# Patient Record
Sex: Female | Born: 1955 | Race: White | Hispanic: No | Marital: Married | Smoking: Never smoker
Health system: Southern US, Community
[De-identification: ages and names within clinical notes are randomized; demographics above are authoritative.]

## PROBLEM LIST (undated history)

## (undated) DIAGNOSIS — F32A Depression, unspecified: Secondary | ICD-10-CM

## (undated) DIAGNOSIS — F329 Major depressive disorder, single episode, unspecified: Secondary | ICD-10-CM

## (undated) DIAGNOSIS — F419 Anxiety disorder, unspecified: Secondary | ICD-10-CM

## (undated) DIAGNOSIS — E669 Obesity, unspecified: Secondary | ICD-10-CM

## (undated) DIAGNOSIS — G4700 Insomnia, unspecified: Secondary | ICD-10-CM

## (undated) DIAGNOSIS — R002 Palpitations: Secondary | ICD-10-CM

## (undated) DIAGNOSIS — K219 Gastro-esophageal reflux disease without esophagitis: Secondary | ICD-10-CM

## (undated) HISTORY — DX: Obesity, unspecified: E66.9

## (undated) HISTORY — DX: Gastro-esophageal reflux disease without esophagitis: K21.9

## (undated) HISTORY — DX: Major depressive disorder, single episode, unspecified: F32.9

## (undated) HISTORY — DX: Anxiety disorder, unspecified: F41.9

## (undated) HISTORY — DX: Insomnia, unspecified: G47.00

## (undated) HISTORY — PX: COLONOSCOPY: SHX174

## (undated) HISTORY — PX: ABDOMINAL HYSTERECTOMY: SHX81

## (undated) HISTORY — DX: Depression, unspecified: F32.A

## (undated) HISTORY — PX: NO PAST SURGERIES: SHX2092

---

## 2008-01-20 LAB — HM COLONOSCOPY

## 2010-06-23 ENCOUNTER — Emergency Department: Payer: Self-pay | Admitting: Emergency Medicine

## 2011-01-19 ENCOUNTER — Ambulatory Visit: Payer: Self-pay

## 2012-01-20 ENCOUNTER — Ambulatory Visit: Payer: Self-pay

## 2012-07-12 ENCOUNTER — Ambulatory Visit: Payer: Self-pay

## 2013-01-20 ENCOUNTER — Ambulatory Visit: Payer: Self-pay

## 2014-03-01 ENCOUNTER — Ambulatory Visit: Payer: Self-pay

## 2014-03-01 LAB — HM MAMMOGRAPHY

## 2014-11-09 ENCOUNTER — Other Ambulatory Visit: Payer: Self-pay | Admitting: Unknown Physician Specialty

## 2014-12-11 ENCOUNTER — Telehealth: Payer: Self-pay | Admitting: Unknown Physician Specialty

## 2014-12-11 NOTE — Telephone Encounter (Signed)
E-Fax came through for refill: Rx: PARoxetine (PAXIL-CR) 12.5 MG 24 hr tablet Copy of Rx in basket

## 2014-12-12 NOTE — Telephone Encounter (Signed)
This was not a refill request.

## 2015-02-12 ENCOUNTER — Encounter: Payer: 59 | Admitting: Unknown Physician Specialty

## 2015-02-12 DIAGNOSIS — K219 Gastro-esophageal reflux disease without esophagitis: Secondary | ICD-10-CM | POA: Insufficient documentation

## 2015-02-12 DIAGNOSIS — F329 Major depressive disorder, single episode, unspecified: Secondary | ICD-10-CM

## 2015-02-12 DIAGNOSIS — G4700 Insomnia, unspecified: Secondary | ICD-10-CM | POA: Insufficient documentation

## 2015-02-12 DIAGNOSIS — F32A Depression, unspecified: Secondary | ICD-10-CM | POA: Insufficient documentation

## 2015-02-19 ENCOUNTER — Ambulatory Visit (INDEPENDENT_AMBULATORY_CARE_PROVIDER_SITE_OTHER): Payer: 59 | Admitting: Unknown Physician Specialty

## 2015-02-19 ENCOUNTER — Encounter: Payer: Self-pay | Admitting: Unknown Physician Specialty

## 2015-02-19 VITALS — BP 108/73 | HR 61 | Temp 98.4°F | Ht 59.6 in | Wt 189.4 lb

## 2015-02-19 DIAGNOSIS — Z0000 Encounter for general adult medical examination without abnormal findings: Secondary | ICD-10-CM | POA: Diagnosis not present

## 2015-02-19 MED ORDER — PAROXETINE HCL ER 12.5 MG PO TB24: 12.5000 mg | ORAL_TABLET | Freq: Every day | ORAL | Status: DC

## 2015-02-19 NOTE — Progress Notes (Signed)
   BP 108/73 mmHg  Pulse 61  Temp(Src) 98.4 F (36.9 C)  Ht 4' 11.6" (1.514 m)  Wt 189 lb 6.4 oz (85.911 kg)  BMI 37.48 kg/m2  SpO2 97%  LMP  (LMP Unknown)   Subjective:    Patient ID: Maureen King, female    DOB: 08-24-1955, 59 y.o.   MRN: [id number]  HPI: Maureen King is a 59 y.o. female   Relevant past medical, surgical, family and social history reviewed and updated as indicated. Interim medical history since our last visit reviewed. Allergies and medications reviewed and updated.  Pt has lot 55 pounds through [facility] over the past year.    Review of Systems  Constitutional: Negative.   HENT: Negative.   Eyes: Negative.   Respiratory: Negative.   Cardiovascular: Negative.   Gastrointestinal: Negative.   Endocrine: Negative.   Genitourinary: Negative.   Musculoskeletal: Negative.   Skin: Negative.   Allergic/Immunologic: Negative.   Neurological: Negative.   Hematological: Negative.   Psychiatric/Behavioral: Negative.     Per HPI unless specifically indicated above     Objective:    BP 108/73 mmHg  Pulse 61  Temp(Src) 98.4 F (36.9 C)  Ht 4' 11.6" (1.514 m)  Wt 189 lb 6.4 oz (85.911 kg)  BMI 37.48 kg/m2  SpO2 97%  LMP  (LMP Unknown)  Wt Readings from Last 3 Encounters:  02/19/15 189 lb 6.4 oz (85.911 kg)  02/07/14 233 lb (105.688 kg)    Physical Exam  Constitutional: She is oriented to person, place, and time. She appears well-developed and well-nourished.  HENT:  Head: Normocephalic and atraumatic.  Eyes: Pupils are equal, round, and reactive to light. Right eye exhibits no discharge. Left eye exhibits no discharge. No scleral icterus.  Neck: Normal range of motion. Neck supple. Carotid bruit is not present. No thyromegaly present.  Cardiovascular: Normal rate, regular rhythm and normal heart sounds.  Exam reveals no gallop and no friction rub.   No murmur heard. Pulmonary/Chest: Effort normal and breath sounds normal. No  respiratory distress. She has no wheezes. She has no rales.  Abdominal: Soft. Bowel sounds are normal. There is no tenderness. There is no rebound.  Genitourinary: No breast swelling, tenderness or discharge.  Musculoskeletal: Normal range of motion.  Lymphadenopathy:    She has no cervical adenopathy.  Neurological: She is alert and oriented to person, place, and time.  Skin: Skin is warm, dry and intact. No rash noted.  Psychiatric: She has a normal mood and affect. Her speech is normal and behavior is normal. Judgment and thought content normal. Cognition and memory are normal.  Vitals reviewed.  Seeing gyn for post menopausal bleeding.  Got labs through [facility] showing excellent results.  Refusing HIV and Hepatitis C.  ++++       Assessment & Plan:   Problem List Items Addressed This Visit    None    Visit Diagnoses    Routine general medical examination at a health care facility    -  Primary    Relevant Orders    MM DIGITAL SCREENING BILATERAL        Follow up plan: Return in about 1 year (around 02/19/2016).

## 2015-02-19 NOTE — Patient Instructions (Signed)
Think you're too busy to work out? We have the workout for you. In minutes, high-intensity interval training (H.I.I.T.) will have you sweating, breathing hard and maximizing the health benefits of exercise without the time commitment. Best of all, it's scientifically proven to work.  What Is H.I.I.T.? SHORT WORKOUTS 101 High-intensity interval training - referred to as H.I.I.T. - is based on the idea that short bursts of strenuous exercise can have a big impact on the body. If moderate exercise - like a 20-minute jog - is good for your heart, lungs and metabolism, H.I.I.T. packs the benefits of that workout and more into a few minutes. It may sound too good to be true, but learning this exercise technique and adapting it to your life can mean saving hours at the gym. If you think you don't have time to exercise, H.I.I.T. may be the workout for you.  You can try it with any aerobic activity you like. The principles of H.I.I.T. can be applied to running, biking, stair climbing, swimming, jumping rope, rowing, even hopping or skipping. (Yes, skipping!)  The downside? Even though H.I.I.T. lasts only minutes, the workouts are tough, requiring you to push your body near its limit.  HOW INTENSE IS HIGH INTENSITY? High-intensity exercise is obviously not a casual stroll down the street, but it's not a run-till-your-lungs-pop explosion, either. Think breathless, not winded. Heart-pounding, not exploding. Legs pumping, but not uncontrolled.  You don't need any fancy heart rate monitors to do these workouts. Use cues from your body as a guide. In the middle of a high-intensity workout you should be able to say single words, but not complete whole sentences. So, if you can keep chatting to your workout partner during this workout, pump it up a few notches.  03-20-29 Training This simple program will help you make the most of a short workout by improving heart health and endurance. Try it with your favorite  cardiovascular activity. The essentials of 03-20-29 training are simple. Run, ride or perhaps row on a rowing machine gently for 30 seconds, accelerate to a moderate pace for 20 seconds, then sprint as hard as you can for 10 seconds. (It should be called 30-20-10 training, obviously, but that is not as catchy.) Repeat.  You don't even need a stopwatch to monitor the 30-, 20-, and 10-second time changes. You can just count to yourself, which seems to make the intervals pass more quickly.  Best of all? The grueling, all-out portion of the workout lasts for only 10 seconds. C'mon, you can do anything for 10 seconds, right?  Got 10 Minutes? A solitary minute of hard work buried in 10 minutes of activity can make a big difference.  The 10-Minute Workout If you like to run, bike, row or swim - just a little bit - this workout is a great option for you. Step 1 Warm up for 2 minutes Step 2 Pedal, run or swim all-out for 20 seconds. Repeat 2 more times Warm down for 3 minutes    GET STARTED To benefit the most from really, really short workouts, you need to build the habit of doing them into your hectic life. Ideally, you'll complete the workout three times a week. The best way to build that habit is to start small and be willing to tweak your schedule where you can to accommodate your new workout.  First set up a spot in your house for your workout, equipped with whatever you need to get the job done: sneakers, a  chair, a towel, etc. Then slot your workout in before you would normally shower. (You can even do it in the bathroom.) Or wake up five minutes earlier and do it first thing in the morning, so you can head off to work feeling accomplished. Or do it during your lunch hour. Run up your office's stairs or grab a private conference room for just a few minutes. Or work it into your commute. If you walk or bike to work, add some heavy intervals on the way home.  GET A BOOST FROM MUSIC Creating a  workout playlist of high-energy tunes you love will not make your workout feel easier, but it may cause you to exercise harder without even realizing it. Best of all, if you are doing a really short workout, you need only one or two great tunes to get you through. If you are willing to try something a bit different, make your own music as you exercise. Sing, hum, clap your hands, whatever you can do to jam along to your playlist. It may give you an extra boost to finish strong.  Find a song or podcast that's the length of your really, really short workout. By the time the song is over, you're done.  Excerpted from the [employer] Well column http://www.nytimes.com/well/guides/really-really-short-workouts?smid=fb-nytwell&smtyp=pay  

## 2015-03-08 ENCOUNTER — Ambulatory Visit
Admission: RE | Admit: 2015-03-08 | Discharge: 2015-03-08 | Disposition: A | Discharge status: Home / Self Care | Payer: 59 | Source: Ambulatory Visit | Attending: Unknown Physician Specialty | Admitting: Unknown Physician Specialty

## 2015-03-08 DIAGNOSIS — Z1231 Encounter for screening mammogram for malignant neoplasm of breast: Secondary | ICD-10-CM | POA: Diagnosis present

## 2015-03-08 DIAGNOSIS — Z0000 Encounter for general adult medical examination without abnormal findings: Secondary | ICD-10-CM | POA: Diagnosis present

## 2015-11-28 ENCOUNTER — Encounter: Payer: Self-pay | Admitting: Family Medicine

## 2015-11-28 ENCOUNTER — Ambulatory Visit (INDEPENDENT_AMBULATORY_CARE_PROVIDER_SITE_OTHER): Payer: 59 | Admitting: Family Medicine

## 2015-11-28 VITALS — BP 105/71 | HR 56 | Temp 97.8°F | Ht 60.0 in | Wt 184.0 lb

## 2015-11-28 DIAGNOSIS — N390 Urinary tract infection, site not specified: Secondary | ICD-10-CM | POA: Diagnosis not present

## 2015-11-28 DIAGNOSIS — R350 Frequency of micturition: Secondary | ICD-10-CM | POA: Diagnosis not present

## 2015-11-28 MED ORDER — SULFAMETHOXAZOLE-TRIMETHOPRIM 800-160 MG PO TABS: 1.0000 | ORAL_TABLET | Freq: Two times a day (BID) | ORAL | Status: DC

## 2015-11-28 NOTE — Patient Instructions (Signed)

## 2015-11-28 NOTE — Progress Notes (Signed)
BP 105/71 mmHg  Pulse 56  Temp(Src) 97.8 F (36.6 C)  Ht 5' (1.524 m)  Wt 184 lb (83.462 kg)  BMI 35.94 kg/m2  SpO2 98%  LMP  (LMP Unknown)   Subjective:    Patient ID: Maureen King, female    DOB: 08-28-1955, 60 y.o.   MRN: [id number]  HPI: Maureen King is a 60 y.o. female  Chief Complaint  Patient presents with  . Urinary Tract Infection    On Monday she used some essential oils in her water and wonders if that may have triggered it. Having alot of frequency, some burning. Some pink coloration.   Urinary symptoms began Monday. Recently started using Slim and Sassy essential oils in her water. Has been having frequency, urgency, mild dysuria, and some pink tinted urine that was yesterday. Patient states she has never had a UTI before. Denies fever, chills, sweats, or lbp. Has not tried taking anything over the counter for symptoms.   Relevant past medical, surgical, family and social history reviewed and updated as indicated. Interim medical history since our last visit reviewed. Allergies and medications reviewed and updated.  Review of Systems  Constitutional: Negative for fever, chills and diaphoresis.  Respiratory: Negative.   Cardiovascular: Negative.   Gastrointestinal: Negative for nausea, vomiting, abdominal pain, diarrhea, constipation and abdominal distention.  Genitourinary: Positive for dysuria, urgency, frequency and hematuria. Negative for flank pain.  Musculoskeletal: Negative.   Skin: Negative.   Neurological: Negative.   Psychiatric/Behavioral: Negative.     Per HPI unless specifically indicated above     Objective:    BP 105/71 mmHg  Pulse 56  Temp(Src) 97.8 F (36.6 C)  Ht 5' (1.524 m)  Wt 184 lb (83.462 kg)  BMI 35.94 kg/m2  SpO2 98%  LMP  (LMP Unknown)  Wt Readings from Last 3 Encounters:  11/28/15 184 lb (83.462 kg)  02/19/15 189 lb 6.4 oz (85.911 kg)  02/07/14 233 lb (105.688 kg)    Physical Exam  Constitutional: She  is oriented to person, place, and time. She appears well-developed and well-nourished. No distress.  HENT:  Head: Atraumatic.  Eyes: Conjunctivae are normal. No scleral icterus.  Neck: Normal range of motion. Neck supple.  Cardiovascular: Normal rate, regular rhythm and normal heart sounds.   Pulmonary/Chest: Effort normal and breath sounds normal.  Abdominal: Soft. Bowel sounds are normal. She exhibits no distension. There is no tenderness. There is no rebound and no guarding.  Musculoskeletal: Normal range of motion.  Neurological: She is alert and oriented to person, place, and time.  Skin: Skin is warm and dry. No rash noted.  Psychiatric: She has a normal mood and affect. Her behavior is normal.    Results for orders placed or performed in visit on 02/12/15  HM MAMMOGRAPHY  Result Value Ref Range   HM Mammogram from [facility]   HM COLONOSCOPY  Result Value Ref Range   HM Colonoscopy from [facility]       Assessment & Plan:   Problem List Items Addressed This Visit    None    Visit Diagnoses    UTI (lower urinary tract infection)    -  Primary    U/A showed very mild infection, will send for culture. Bactrim given as she is very symptomatic. Encouraged good PO fluid intake.     Relevant Medications    sulfamethoxazole-trimethoprim (BACTRIM DS,SEPTRA DS) 800-160 MG tablet    Urinary frequency        Relevant Orders  UA/M w/rflx Culture, Routine (STAT)        Follow up plan: Return if symptoms worsen or fail to improve.

## 2015-11-29 ENCOUNTER — Ambulatory Visit: Payer: 59 | Admitting: Family Medicine

## 2015-12-01 LAB — MICROSCOPIC EXAMINATION

## 2015-12-01 LAB — UA/M W/RFLX CULTURE, ROUTINE
BILIRUBIN UA: NEGATIVE
GLUCOSE, UA: NEGATIVE
KETONES UA: NEGATIVE
Nitrite, UA: NEGATIVE
PROTEIN UA: NEGATIVE
Urobilinogen, Ur: 0.2 mg/dL (ref 0.2–1.0)
pH, UA: 6.5 (ref 5.0–7.5)

## 2015-12-01 LAB — URINE CULTURE, REFLEX

## 2016-01-23 ENCOUNTER — Encounter (INDEPENDENT_AMBULATORY_CARE_PROVIDER_SITE_OTHER): Payer: Self-pay

## 2016-02-04 ENCOUNTER — Other Ambulatory Visit: Payer: Self-pay | Admitting: Unknown Physician Specialty

## 2016-02-21 ENCOUNTER — Encounter: Payer: Self-pay | Admitting: Unknown Physician Specialty

## 2016-02-21 ENCOUNTER — Ambulatory Visit (INDEPENDENT_AMBULATORY_CARE_PROVIDER_SITE_OTHER): Payer: 59 | Admitting: Unknown Physician Specialty

## 2016-02-21 VITALS — BP 98/64 | HR 55 | Temp 97.8°F | Ht 59.7 in | Wt 187.1 lb

## 2016-02-21 DIAGNOSIS — E669 Obesity, unspecified: Secondary | ICD-10-CM

## 2016-02-21 DIAGNOSIS — F32A Depression, unspecified: Secondary | ICD-10-CM

## 2016-02-21 DIAGNOSIS — G4700 Insomnia, unspecified: Secondary | ICD-10-CM | POA: Diagnosis not present

## 2016-02-21 DIAGNOSIS — Z0000 Encounter for general adult medical examination without abnormal findings: Secondary | ICD-10-CM

## 2016-02-21 DIAGNOSIS — F329 Major depressive disorder, single episode, unspecified: Secondary | ICD-10-CM

## 2016-02-21 NOTE — Assessment & Plan Note (Signed)
Discussed behavioral strategies

## 2016-02-21 NOTE — Progress Notes (Signed)
BP 98/64 (BP Location: Left Arm, Patient Position: Sitting, Cuff Size: Large)   Pulse (!) 55   Temp 97.8 F (36.6 C)   Ht 4' 11.7" (1.516 m)   Wt 187 lb 1.6 oz (84.9 kg)   LMP  (LMP Unknown)   SpO2 97%   BMI 36.91 kg/m    Subjective:    Patient ID: Maureen King, female    DOB: 03/14/1956, 60 y.o.   MRN: [id number]  HPI: Maureen King is a 60 y.o. female  Chief Complaint  Patient presents with  . Annual Exam   Obesity Pt has lost as many as 40-60 pounds and has kept it off.  Has not met her 10% goal from last year.    Depression Having some trouble sleeping.  Wakes sometimes with palpitations.  Falls asleep well but early awakening.  Admits to a lot of caffeine. Depression screen [facility] 2/9 02/21/2016 02/19/2015  Decreased Interest 0 0  Down, Depressed, Hopeless 0 0  PHQ - 2 Score 0 0       Past Medical History:  Diagnosis Date  . Depression   . GERD (gastroesophageal reflux disease)   . Insomnia   . Obesity    Social History   Social History  . Marital status: Married    Spouse name: N/A  . Number of children: N/A  . Years of education: N/A   Occupational History  . Not on file.   Social History Main Topics  . Smoking status: Never Smoker  . Smokeless tobacco: Never Used  . Alcohol use 0.0 oz/week     Comment: on occasion  . Drug use: No  . Sexual activity: Yes   Other Topics Concern  . Not on file   Social History Narrative  . No narrative on file   Family History  Problem Relation Age of Onset  . Hypertension Mother   . Osteoporosis Mother   . Heart disease Father     Stent  . Lung disease Father   . Hypertension Brother   . Cancer Maternal Grandmother     gallbladder  . Heart disease Paternal Grandfather     MI   History reviewed. No pertinent surgical history.    Relevant past medical, surgical, family and social history reviewed and updated as indicated. Interim medical history since our last visit  reviewed. Allergies and medications reviewed and updated.  Review of Systems  Per HPI unless specifically indicated above     Objective:    BP 98/64 (BP Location: Left Arm, Patient Position: Sitting, Cuff Size: Large)   Pulse (!) 55   Temp 97.8 F (36.6 C)   Ht 4' 11.7" (1.516 m)   Wt 187 lb 1.6 oz (84.9 kg)   LMP  (LMP Unknown)   SpO2 97%   BMI 36.91 kg/m   Wt Readings from Last 3 Encounters:  02/21/16 187 lb 1.6 oz (84.9 kg)  11/28/15 184 lb (83.5 kg)  02/19/15 189 lb 6.4 oz (85.9 kg)    Physical Exam  Constitutional: She is oriented to person, place, and time. She appears well-developed and well-nourished.  HENT:  Head: Normocephalic and atraumatic.  Eyes: Pupils are equal, round, and reactive to light. Right eye exhibits no discharge. Left eye exhibits no discharge. No scleral icterus.  Neck: Normal range of motion. Neck supple. Carotid bruit is not present. No thyromegaly present.  Cardiovascular: Normal rate, regular rhythm and normal heart sounds.  Exam reveals no gallop and no friction  rub.   No murmur heard. Pulmonary/Chest: Effort normal and breath sounds normal. No respiratory distress. She has no wheezes. She has no rales.  Abdominal: Soft. Bowel sounds are normal. There is no tenderness. There is no rebound.  Genitourinary: No breast swelling, tenderness or discharge.  Musculoskeletal: Normal range of motion.  Lymphadenopathy:    She has no cervical adenopathy.  Neurological: She is alert and oriented to person, place, and time.  Skin: Skin is warm, dry and intact. No rash noted.  Psychiatric: She has a normal mood and affect. Her speech is normal and behavior is normal. Judgment and thought content normal. Cognition and memory are normal.    Results for orders placed or performed in visit on 11/28/15  Microscopic Examination  Result Value Ref Range   WBC, UA 0-5 0 - 5 /hpf   RBC, UA 3-10 (A) 0 - 2 /hpf   Epithelial Cells (non renal) 0-10 0 - 10 /hpf    Bacteria, UA Few None seen/Few  UA/M w/rflx Culture, Routine (STAT)  Result Value Ref Range   Specific Gravity, UA <1.005 (L) 1.005 - 1.030   pH, UA 6.5 5.0 - 7.5   Color, UA Yellow Yellow   Appearance Ur Clear Clear   Leukocytes, UA 2+ (A) Negative   Protein, UA Negative Negative/Trace   Glucose, UA Negative Negative   Ketones, UA Negative Negative   RBC, UA 2+ (A) Negative   Bilirubin, UA Negative Negative   Urobilinogen, Ur 0.2 0.2 - 1.0 mg/dL   Nitrite, UA Negative Negative   Microscopic Examination See below:    Urinalysis Reflex Comment   Urine Culture, Routine  Result Value Ref Range   Urine Culture, Routine Final report    Urine Culture result 1 Comment     ,  Assessment & Plan:   Problem List Items Addressed This Visit      Unprioritized   Depression    Stable, continue present medications.        Insomnia    Discussed behavioral strategies      Obesity - Primary    Pt has lost weight and kept it off.  No obesity related illnesses.  Will try to continue her weight loss       Other Visit Diagnoses    Routine general medical examination at a health care facility       Relevant Orders   IGP, Aptima HPV, rfx 16/18,45   MM DIGITAL SCREENING BILATERAL       Follow up plan: Return in about 1 year (around 02/20/2017), or if symptoms worsen or fail to improve.

## 2016-02-21 NOTE — Assessment & Plan Note (Signed)
Pt has lost weight and kept it off.  No obesity related illnesses.  Will try to continue her weight loss

## 2016-02-21 NOTE — Patient Instructions (Addendum)
Insomnia Insomnia is a sleep disorder that makes it difficult to fall asleep or to stay asleep. Insomnia can cause tiredness (fatigue), low energy, difficulty concentrating, mood swings, and poor performance at work or school.  There are three different ways to classify insomnia:  Difficulty falling asleep.  Difficulty staying asleep.  Waking up too early in the morning. Any type of insomnia can be long-term (chronic) or short-term (acute). Both are common. Short-term insomnia usually lasts for three months or less. Chronic insomnia occurs at least three times a week for longer than three months. CAUSES  Insomnia may be caused by another condition, situation, or substance, such as:  Anxiety.  Certain medicines.  Gastroesophageal reflux disease (GERD) or other gastrointestinal conditions.  Asthma or other breathing conditions.  Restless legs syndrome, sleep apnea, or other sleep disorders.  Chronic pain.  Menopause. This may include hot flashes.  Stroke.  Abuse of alcohol, tobacco, or illegal drugs.  Depression.  Caffeine.   Neurological disorders, such as Alzheimer disease.  An overactive thyroid (hyperthyroidism). The cause of insomnia may not be known. RISK FACTORS Risk factors for insomnia include:  Gender. Women are more commonly affected than men.  Age. Insomnia is more common as you get older.  Stress. This may involve your professional or personal life.  Income. Insomnia is more common in people with lower income.  Lack of exercise.   Irregular work schedule or night shifts.  Traveling between different time zones. SIGNS AND SYMPTOMS If you have insomnia, trouble falling asleep or trouble staying asleep is the main symptom. This may lead to other symptoms, such as:  Feeling fatigued.  Feeling nervous about going to sleep.  Not feeling rested in the morning.  Having trouble concentrating.  Feeling irritable, anxious, or depressed. TREATMENT   Treatment for insomnia depends on the cause. If your insomnia is caused by an underlying condition, treatment will focus on addressing the condition. Treatment may also include:   Medicines to help you sleep.  Counseling or therapy.  Lifestyle adjustments. HOME CARE INSTRUCTIONS   Take medicines only as directed by your health care provider.  Keep regular sleeping and waking hours. Avoid naps.  Keep a sleep diary to help you and your health care provider figure out what could be causing your insomnia. Include:   When you sleep.  When you wake up during the night.  How well you sleep.   How rested you feel the next day.  Any side effects of medicines you are taking.  What you eat and drink.   Make your bedroom a comfortable place where it is easy to fall asleep:  Put up shades or special blackout curtains to block light from outside.  Use a white noise machine to block noise.  Keep the temperature cool.   Exercise regularly as directed by your health care provider. Avoid exercising right before bedtime.  Use relaxation techniques to manage stress. Ask your health care provider to suggest some techniques that may work well for you. These may include:  Breathing exercises.  Routines to release muscle tension.  Visualizing peaceful scenes.  Cut back on alcohol, caffeinated beverages, and cigarettes, especially close to bedtime. These can disrupt your sleep.  Do not overeat or eat spicy foods right before bedtime. This can lead to digestive discomfort that can make it hard for you to sleep.  Limit screen use before bedtime. This includes:  Watching TV.  Using your smartphone, tablet, and computer.  Stick to a routine. This   can help you fall asleep faster. Try to do a quiet activity, brush your teeth, and go to bed at the same time each night.  Get out of bed if you are still awake after 15 minutes of trying to sleep. Keep the lights down, but try reading or  doing a quiet activity. When you feel sleepy, go back to bed.  Make sure that you drive carefully. Avoid driving if you feel very sleepy.  Keep all follow-up appointments as directed by your health care provider. This is important. SEEK MEDICAL CARE IF:   You are tired throughout the day or have trouble in your daily routine due to sleepiness.  You continue to have sleep problems or your sleep problems get worse. SEEK IMMEDIATE MEDICAL CARE IF:   You have serious thoughts about hurting yourself or someone else.   This information is not intended to replace advice given to you by your health care provider. Make sure you discuss any questions you have with your health care provider.   ----------------------------------------------------------------------------------------- Please do call to schedule your mammogram; the number to schedule one at either [facility] or [facility] is [phone]

## 2016-02-21 NOTE — Assessment & Plan Note (Signed)
Stable, continue present medications.   

## 2016-02-27 LAB — IGP, APTIMA HPV, RFX 16/18,45
HPV Aptima: NEGATIVE
PAP SMEAR COMMENT: 0

## 2016-02-28 ENCOUNTER — Telehealth: Payer: Self-pay | Admitting: Unknown Physician Specialty

## 2016-02-28 NOTE — Telephone Encounter (Signed)
Discussed with pt pap.  Recheck in 3 years

## 2016-04-15 ENCOUNTER — Ambulatory Visit
Admission: RE | Admit: 2016-04-15 | Discharge: 2016-04-15 | Disposition: A | Discharge status: Home / Self Care | Payer: 59 | Source: Ambulatory Visit | Attending: Unknown Physician Specialty | Admitting: Unknown Physician Specialty

## 2016-04-15 DIAGNOSIS — Z1231 Encounter for screening mammogram for malignant neoplasm of breast: Secondary | ICD-10-CM | POA: Diagnosis not present

## 2016-04-15 DIAGNOSIS — Z0000 Encounter for general adult medical examination without abnormal findings: Secondary | ICD-10-CM

## 2016-04-25 ENCOUNTER — Other Ambulatory Visit: Payer: Self-pay | Admitting: Family Medicine

## 2017-02-26 ENCOUNTER — Encounter: Payer: 59 | Admitting: Unknown Physician Specialty

## 2017-03-08 ENCOUNTER — Ambulatory Visit (INDEPENDENT_AMBULATORY_CARE_PROVIDER_SITE_OTHER): Payer: 59 | Admitting: Unknown Physician Specialty

## 2017-03-08 ENCOUNTER — Encounter: Payer: Self-pay | Admitting: Unknown Physician Specialty

## 2017-03-08 VITALS — BP 117/74 | HR 58 | Temp 97.7°F | Ht 59.6 in | Wt 199.6 lb

## 2017-03-08 DIAGNOSIS — R002 Palpitations: Secondary | ICD-10-CM

## 2017-03-08 DIAGNOSIS — F329 Major depressive disorder, single episode, unspecified: Secondary | ICD-10-CM | POA: Diagnosis not present

## 2017-03-08 DIAGNOSIS — Z23 Encounter for immunization: Secondary | ICD-10-CM | POA: Diagnosis not present

## 2017-03-08 DIAGNOSIS — E6609 Other obesity due to excess calories: Secondary | ICD-10-CM | POA: Diagnosis not present

## 2017-03-08 DIAGNOSIS — Z6839 Body mass index (BMI) 39.0-39.9, adult: Secondary | ICD-10-CM | POA: Diagnosis not present

## 2017-03-08 DIAGNOSIS — R001 Bradycardia, unspecified: Secondary | ICD-10-CM | POA: Diagnosis not present

## 2017-03-08 DIAGNOSIS — Z0000 Encounter for general adult medical examination without abnormal findings: Secondary | ICD-10-CM | POA: Diagnosis not present

## 2017-03-08 DIAGNOSIS — F32A Depression, unspecified: Secondary | ICD-10-CM

## 2017-03-08 DIAGNOSIS — Z1211 Encounter for screening for malignant neoplasm of colon: Secondary | ICD-10-CM

## 2017-03-08 DIAGNOSIS — Z1231 Encounter for screening mammogram for malignant neoplasm of breast: Secondary | ICD-10-CM

## 2017-03-08 NOTE — Assessment & Plan Note (Signed)
Stable, continue present medications.   

## 2017-03-08 NOTE — Progress Notes (Signed)
BP 117/74   Pulse (!) 58   Temp 97.7 F (36.5 C)   Ht 4' 11.6" (1.514 m)   Wt 199 lb 9.6 oz (90.5 kg)   LMP  (LMP Unknown)   SpO2 98%   BMI 39.51 kg/m    Subjective:    Patient ID: Maureen King, female    DOB: 1955-10-02, 61 y.o.   MRN: [id number]  HPI: Maureen King is a 61 y.o. female  Chief Complaint  Patient presents with  . Annual Exam    pt is interested in Hep C  . Sinusitis    pt states she has been having a headache, scratchy throat and sinus trouble since Thursday   URI   This is a new problem. The current episode started in the past 7 days. There has been no fever. Associated symptoms include congestion, rhinorrhea and a sore throat.   Obesity Has plans for improved diet and activity.    Depression Doing well with daily Paxil.   Depression screen [facility] 2/9 03/08/2017 02/21/2016 02/19/2015  Decreased Interest 0 0 0  Down, Depressed, Hopeless 0 0 0  PHQ - 2 Score 0 0 0  Altered sleeping 0 - -  Tired, decreased energy 0 - -  Change in appetite 1 - -  Feeling bad or failure about yourself  0 - -  Trouble concentrating 0 - -  Moving slowly or fidgety/restless 0 - -  Suicidal thoughts 0 - -  PHQ-9 Score 1 - -    Relevant past medical, surgical, family and social history reviewed and updated as indicated. Interim medical history since our last visit reviewed. Allergies and medications reviewed and updated.  Review of Systems  HENT: Positive for congestion, rhinorrhea and sore throat.   Cardiovascular: Positive for palpitations.       Sporadic episodes of palpitations. Wakes her up at night with a rapid heart rate    Per HPI unless specifically indicated above     Objective:    BP 117/74   Pulse (!) 58   Temp 97.7 F (36.5 C)   Ht 4' 11.6" (1.514 m)   Wt 199 lb 9.6 oz (90.5 kg)   LMP  (LMP Unknown)   SpO2 98%   BMI 39.51 kg/m   Wt Readings from Last 3 Encounters:  03/08/17 199 lb 9.6 oz (90.5 kg)  02/21/16 187 lb 1.6 oz (84.9 kg)   11/28/15 184 lb (83.5 kg)    Physical Exam  Constitutional: She is oriented to person, place, and time. She appears well-developed and well-nourished.  HENT:  Head: Normocephalic and atraumatic.  Eyes: Pupils are equal, round, and reactive to light. Right eye exhibits no discharge. Left eye exhibits no discharge. No scleral icterus.  Neck: Normal range of motion. Neck supple. Carotid bruit is not present. No thyromegaly present.  Cardiovascular: Normal rate, regular rhythm and normal heart sounds.  Exam reveals no gallop and no friction rub.   No murmur heard. Pulmonary/Chest: Effort normal and breath sounds normal. No respiratory distress. She has no wheezes. She has no rales.  Abdominal: Soft. Bowel sounds are normal. There is no tenderness. There is no rebound.  Genitourinary: No breast swelling, tenderness or discharge.  Musculoskeletal: Normal range of motion.  Lymphadenopathy:    She has no cervical adenopathy.  Neurological: She is alert and oriented to person, place, and time.  Skin: Skin is warm, dry and intact. No rash noted.  Psychiatric: She has a normal mood and  affect. Her speech is normal and behavior is normal. Judgment and thought content normal. Cognition and memory are normal.   EKG shows sinus brady.  No other acute changes.   Results for orders placed or performed in visit on 02/21/16  IGP, Aptima HPV, rfx 16/18,45  Result Value Ref Range   DIAGNOSIS: Comment (A)    Specimen adequacy: Comment    Clinician Provided ICD10 Comment    Performed by: Comment    Electronically signed by: Comment    PAP Smear Comment .    PATHOLOGIST PROVIDED ICD10: Comment    Note: Comment    Test Methodology Comment    HPV Aptima Negative Negative      Assessment & Plan:   Problem List Items Addressed This Visit      Unprioritized   Bradycardia    With palpitations.  Will refer to cardiology      Depression    Stable, continue present medications.        Obesity -  Primary    Will work with [employer].  Will start exercises       Other Visit Diagnoses    Routine general medical examination at a health care facility       Relevant Orders   Comprehensive metabolic panel   Lipid Panel w/o Chol/HDL Ratio   TSH   CBC with Differential/Platelet   Hepatitis C antibody   VITAMIN D 25 Hydroxy (Vit-D Deficiency, Fractures)   Palpitations       Relevant Orders   EKG 12-Lead (Completed)   Ambulatory referral to Cardiology   Visit for screening mammogram       Relevant Orders   MM DIGITAL SCREENING BILATERAL   Screening for colon cancer       Relevant Orders   Ambulatory referral to Gastroenterology       Follow up plan: No Follow-up on file.

## 2017-03-08 NOTE — Assessment & Plan Note (Signed)
Will work with [employer].  Will start exercises

## 2017-03-08 NOTE — Assessment & Plan Note (Signed)
With palpitations.  Will refer to cardiology

## 2017-03-09 ENCOUNTER — Encounter: Payer: Self-pay | Admitting: Unknown Physician Specialty

## 2017-03-09 LAB — COMPREHENSIVE METABOLIC PANEL
A/G RATIO: 2.3 — AB (ref 1.2–2.2)
ALBUMIN: 4.2 g/dL (ref 3.6–4.8)
ALT: 25 IU/L (ref 0–32)
AST: 26 IU/L (ref 0–40)
Alkaline Phosphatase: 94 IU/L (ref 39–117)
BILIRUBIN TOTAL: 0.3 mg/dL (ref 0.0–1.2)
BUN / CREAT RATIO: 24 (ref 12–28)
BUN: 16 mg/dL (ref 8–27)
CHLORIDE: 103 mmol/L (ref 96–106)
CO2: 25 mmol/L (ref 20–29)
Calcium: 9.4 mg/dL (ref 8.7–10.3)
Creatinine, Ser: 0.67 mg/dL (ref 0.57–1.00)
GFR calc non Af Amer: 96 mL/min/{1.73_m2} (ref >59)
GFR, EST AFRICAN AMERICAN: 110 mL/min/{1.73_m2} (ref >59)
GLOBULIN, TOTAL: 1.8 g/dL (ref 1.5–4.5)
Glucose: 89 mg/dL (ref 65–99)
POTASSIUM: 4.2 mmol/L (ref 3.5–5.2)
Sodium: 140 mmol/L (ref 134–144)
TOTAL PROTEIN: 6 g/dL (ref 6.0–8.5)

## 2017-03-09 LAB — LIPID PANEL W/O CHOL/HDL RATIO
Cholesterol, Total: 200 mg/dL — ABNORMAL HIGH (ref 100–199)
HDL: 97 mg/dL (ref >39)
LDL Calculated: 86 mg/dL (ref 0–99)
Triglycerides: 86 mg/dL (ref 0–149)
VLDL CHOLESTEROL CAL: 17 mg/dL (ref 5–40)

## 2017-03-09 LAB — CBC WITH DIFFERENTIAL/PLATELET
Basophils Absolute: 0 10*3/uL (ref 0.0–0.2)
Basos: 1 %
EOS (ABSOLUTE): 0.2 10*3/uL (ref 0.0–0.4)
EOS: 4 %
HEMATOCRIT: 37.3 % (ref 34.0–46.6)
Hemoglobin: 13.1 g/dL (ref 11.1–15.9)
IMMATURE GRANULOCYTES: 0 %
Immature Grans (Abs): 0 10*3/uL (ref 0.0–0.1)
Lymphocytes Absolute: 1 10*3/uL (ref 0.7–3.1)
Lymphs: 23 %
MCH: 30.5 pg (ref 26.6–33.0)
MCHC: 35.1 g/dL (ref 31.5–35.7)
MCV: 87 fL (ref 79–97)
MONOCYTES: 8 %
Monocytes Absolute: 0.3 10*3/uL (ref 0.1–0.9)
NEUTROS PCT: 64 %
Neutrophils Absolute: 2.6 10*3/uL (ref 1.4–7.0)
Platelets: 244 10*3/uL (ref 150–379)
RBC: 4.3 x10E6/uL (ref 3.77–5.28)
RDW: 13.1 % (ref 12.3–15.4)
WBC: 4.1 10*3/uL (ref 3.4–10.8)

## 2017-03-09 LAB — HEPATITIS C ANTIBODY

## 2017-03-09 LAB — VITAMIN D 25 HYDROXY (VIT D DEFICIENCY, FRACTURES): VIT D 25 HYDROXY: 40.3 ng/mL (ref 30.0–100.0)

## 2017-03-09 LAB — TSH: TSH: 1.13 u[IU]/mL (ref 0.450–4.500)

## 2017-04-16 ENCOUNTER — Ambulatory Visit
Admission: RE | Admit: 2017-04-16 | Discharge: 2017-04-16 | Disposition: A | Discharge status: Home / Self Care | Payer: 59 | Source: Ambulatory Visit | Attending: Unknown Physician Specialty | Admitting: Unknown Physician Specialty

## 2017-04-16 DIAGNOSIS — Z1231 Encounter for screening mammogram for malignant neoplasm of breast: Secondary | ICD-10-CM | POA: Insufficient documentation

## 2017-05-10 ENCOUNTER — Ambulatory Visit: Payer: 59 | Admitting: Cardiovascular Disease

## 2017-06-14 NOTE — Progress Notes (Signed)
New Outpatient Visit Date: 06/16/2017  Referring Provider: Kathrine Haddock, NP [address], [place] [address]  Chief Complaint: Palpitations  HPI:  Ms. Hubbard is a 62 y.o. female who is being seen today for the evaluation of palpitations at the request of Ms. Wicker. She has a history of GERD and depression/anxiety. At her most recent PCP visit in 04/25/17, Ms. Hitt was noted to be bradycardic (EKG with sinus bradycardia at 55 bpm). She also noted sporadic palpitations that occasionally wake her up at night.  Ms. Hargreaves reports a rapid heart beat that would frequently wake her up at night. It only seemed to last for a few seconds and was not accompanied by any symptoms like chest pain, shortness of breath, and lightheadedness. The palpitations were most pronounced in 03-27-23 and 04-26-23 and resolved after decrease in caffeine consumption. She experienced another episode last night, which again lasted only a few seconds and was without accompanying symptoms. Ms. Mckamey notes increased stress recently, as well as increased caffeine intake again. She has otherwise felt well; she denies every having chest pain, shortness of breath, lightheadedness, orthopnea, PND, and edema. She has been under more stress recently, helping to care for her parents.  --------------------------------------------------------------------------------------------------  Cardiovascular History & Procedures: Cardiovascular Problems:  palpitations  Risk Factors:  Obesity  Cath/PCI:  None  CV Surgery:  None  EP Procedures and Devices:  None  Non-Invasive Evaluation(s):  None  Recent CV Pertinent Labs: Lab Results  Component Value Date   CHOL 200 (H) 03/08/2017   HDL 97 03/08/2017   LDLCALC 86 03/08/2017   TRIG 86 03/08/2017   K 4.2 03/08/2017   BUN 16 03/08/2017   CREATININE 0.67 03/08/2017     --------------------------------------------------------------------------------------------------  Past Medical History:  Diagnosis Date  . Anxiety   . Depression   . GERD (gastroesophageal reflux disease)   . Insomnia   . Obesity     Past Surgical History:  Procedure Laterality Date  . NO PAST SURGERIES      Current Meds  Medication Sig  . aspirin 81 MG tablet Take 81 mg by mouth daily.  . Calcium Carbonate-Vit D-Min (CALCIUM 600 + MINERALS PO) Take 600 mg by mouth daily.  . cholecalciferol (VITAMIN D) 1000 UNITS tablet Take 1,000 Units by mouth daily.  . Magnesium 250 MG TABS Take 250 mg by mouth daily.  Marland [employer] PARoxetine (PAXIL-CR) 12.5 MG 24 hr tablet TAKE 1 TABLET BY MOUTH  DAILY  . vitamin B-12 (CYANOCOBALAMIN) 250 MCG tablet Take 250 mcg by mouth daily.    Allergies: Penicillins and Neosporin [neomycin-polymyxin-gramicidin]  Social History   Socioeconomic History  . Marital status: Married    Spouse name: Not on file  . Number of children: Not on file  . Years of education: Not on file  . Highest education level: Not on file  Social Needs  . Financial resource strain: Not on file  . Food insecurity - worry: Not on file  . Food insecurity - inability: Not on file  . Transportation needs - medical: Not on file  . Transportation needs - non-medical: Not on file  Occupational History  . Not on file  Tobacco Use  . Smoking status: Never Smoker  . Smokeless tobacco: Never Used  Substance and Sexual Activity  . Alcohol use: Yes    Alcohol/week: 0.0 oz    Comment: on occasion (4x/year)  . Drug use: No  . Sexual activity: Yes  Other Topics Concern  . Not on  file  Social History Narrative  . Not on file    Family History  Problem Relation Age of Onset  . Hypertension Mother   . Osteoporosis Mother   . Heart disease Father 73       Stent  . Lung disease Father   . Idiopathic pulmonary fibrosis Father   . Hypertension Brother   . Cancer Maternal  Grandmother        gallbladder  . Heart disease Paternal Grandfather        MI    Review of Systems: Ms. Andy reports 2 episodes of floaters/flashers with subsequent mild headache. She was previously told this may represent a migraine. Otherwise, a 12-system review of systems was performed and was negative except as noted in the HPI.  --------------------------------------------------------------------------------------------------  Physical Exam: BP 114/64 (BP Location: Right Arm, Patient Position: Sitting, Cuff Size: Normal)   Pulse 61   Ht 5' (1.524 m)   Wt 202 lb 8 oz (91.9 kg)   LMP  (LMP Unknown)   BMI 39.55 kg/m   General:  Obese woman, seated comfortably in the exam room. HEENT: No conjunctival pallor or scleral icterus. Moist mucous membranes. OP clear. Neck: Supple without lymphadenopathy, thyromegaly, JVD, or HJR. No carotid bruit. Lungs: Normal work of breathing. Clear to auscultation bilaterally without wheezes or crackles. Heart: Regular rate and rhythm without murmurs, rubs, or gallops. Unable to assess PMI due to body habitus. Abd: Bowel sounds present. Soft, NT/ND. Unable to assess HSM due to body habitus. Ext: No lower extremity edema. Radial, PT, and DP pulses are 2+ bilaterally Skin: Warm and dry without rash. Neuro: CNIII-XII intact. Strength and fine-touch sensation intact in upper and lower extremities bilaterally. Psych: Normal mood and affect.  EKG:  NSR (HR 61 bpm) without abnormalities.  Lab Results  Component Value Date   WBC 4.1 03/08/2017   HGB 13.1 03/08/2017   HCT 37.3 03/08/2017   MCV 87 03/08/2017   PLT 244 03/08/2017    Lab Results  Component Value Date   NA 140 03/08/2017   K 4.2 03/08/2017   CL 103 03/08/2017   CO2 25 03/08/2017   BUN 16 03/08/2017   CREATININE 0.67 03/08/2017   GLUCOSE 89 03/08/2017   ALT 25 03/08/2017    Lab Results  Component Value Date   CHOL 200 (H) 03/08/2017   HDL 97 03/08/2017   LDLCALC 86  03/08/2017   TRIG 86 03/08/2017   Lab Results  Component Value Date   TSH 1.130 03/08/2017    --------------------------------------------------------------------------------------------------  ASSESSMENT AND PLAN: Palpitations Symptoms are brief and without worrisome symptoms. Palpitations had actually resolved for several months with decreased caffeine intake and less stress. EKG and exam today are unremarkable. Though heart rate is often low normal, Ms. Bachmeier notes that her heart rate rises into the 90's when she is walking (based on her Fitbit). This suggests a normal chronotropic response. TSH in 03/2017 was normal. We discussed event monitor placement, though since she has only had one episode since 03/2017, diagnostic yield is low. We have agreed to defer any additional testing at this time. I encouraged Ms. Grandinetti to cut down on her caffeine intake. If palpitations become more frequent, Ms. Kilner will contact us so the event monitor can be placed.  Follow-up: Return to clinic as needed.  Nelva Bush, MD 06/17/2017 7:31 AM

## 2017-06-16 ENCOUNTER — Encounter: Payer: Self-pay | Admitting: Internal Medicine

## 2017-06-16 ENCOUNTER — Ambulatory Visit: Payer: 59 | Admitting: Internal Medicine

## 2017-06-16 VITALS — BP 114/64 | HR 61 | Ht 60.0 in | Wt 202.5 lb

## 2017-06-16 DIAGNOSIS — R002 Palpitations: Secondary | ICD-10-CM

## 2017-06-16 NOTE — Patient Instructions (Signed)
Medication Instructions:  Your physician recommends that you continue on your current medications as directed. Please refer to the Current Medication list given to you today.   Labwork: none  Testing/Procedures: none  Follow-Up: Your physician recommends that you schedule a follow-up appointment in: as needed.   Any Other Special Instructions Will Be Listed Below (If Applicable).  Dr End recommends you decrease your intake of caffeine.

## 2017-06-17 ENCOUNTER — Encounter: Payer: Self-pay | Admitting: Internal Medicine

## 2017-06-17 DIAGNOSIS — R002 Palpitations: Secondary | ICD-10-CM | POA: Insufficient documentation

## 2017-07-06 ENCOUNTER — Other Ambulatory Visit: Payer: Self-pay | Admitting: Unknown Physician Specialty

## 2018-03-11 ENCOUNTER — Encounter: Payer: Self-pay | Admitting: Family Medicine

## 2018-03-11 ENCOUNTER — Other Ambulatory Visit: Payer: Self-pay

## 2018-03-11 ENCOUNTER — Ambulatory Visit (INDEPENDENT_AMBULATORY_CARE_PROVIDER_SITE_OTHER): Payer: Managed Care, Other (non HMO) | Admitting: Family Medicine

## 2018-03-11 VITALS — BP 108/73 | HR 56 | Temp 99.0°F | Ht 61.2 in | Wt 215.5 lb

## 2018-03-11 DIAGNOSIS — F3341 Major depressive disorder, recurrent, in partial remission: Secondary | ICD-10-CM

## 2018-03-11 DIAGNOSIS — Z23 Encounter for immunization: Secondary | ICD-10-CM | POA: Diagnosis not present

## 2018-03-11 DIAGNOSIS — Z0000 Encounter for general adult medical examination without abnormal findings: Secondary | ICD-10-CM

## 2018-03-11 DIAGNOSIS — Z1211 Encounter for screening for malignant neoplasm of colon: Secondary | ICD-10-CM | POA: Diagnosis not present

## 2018-03-11 DIAGNOSIS — R002 Palpitations: Secondary | ICD-10-CM

## 2018-03-11 DIAGNOSIS — Z1239 Encounter for other screening for malignant neoplasm of breast: Secondary | ICD-10-CM | POA: Diagnosis not present

## 2018-03-11 LAB — UA/M W/RFLX CULTURE, ROUTINE
BILIRUBIN UA: NEGATIVE
GLUCOSE, UA: NEGATIVE
KETONES UA: NEGATIVE
NITRITE UA: NEGATIVE
Protein, UA: NEGATIVE
RBC UA: NEGATIVE
SPEC GRAV UA: 1.01 (ref 1.005–1.030)
Urobilinogen, Ur: 0.2 mg/dL (ref 0.2–1.0)
pH, UA: 6 (ref 5.0–7.5)

## 2018-03-11 LAB — MICROSCOPIC EXAMINATION

## 2018-03-11 LAB — BAYER DCA HB A1C WAIVED: HB A1C: 5 % (ref <7.0)

## 2018-03-11 MED ORDER — PAROXETINE HCL ER 25 MG PO TB24: 25.0000 mg | ORAL_TABLET | Freq: Every day | ORAL | 3 refills | Status: DC

## 2018-03-11 NOTE — Patient Instructions (Signed)
Health Maintenance for Postmenopausal Women Menopause is a normal process in which your reproductive ability comes to an end. This process happens gradually over a span of months to years, usually between the ages of 22 and 9. Menopause is complete when you have missed 12 consecutive menstrual periods. It is important to talk with your health care provider about some of the most common conditions that affect postmenopausal women, such as heart disease, cancer, and bone loss (osteoporosis). Adopting a healthy lifestyle and getting preventive care can help to promote your health and wellness. Those actions can also lower your chances of developing some of these common conditions. What should I know about menopause? During menopause, you may experience a number of symptoms, such as:  Moderate-to-severe hot flashes.  Night sweats.  Decrease in sex drive.  Mood swings.  Headaches.  Tiredness.  Irritability.  Memory problems.  Insomnia.  Choosing to treat or not to treat menopausal changes is an individual decision that you make with your health care provider. What should I know about hormone replacement therapy and supplements? Hormone therapy products are effective for treating symptoms that are associated with menopause, such as hot flashes and night sweats. Hormone replacement carries certain risks, especially as you become older. If you are thinking about using estrogen or estrogen with progestin treatments, discuss the benefits and risks with your health care provider. What should I know about heart disease and stroke? Heart disease, heart attack, and stroke become more likely as you age. This may be due, in part, to the hormonal changes that your body experiences during menopause. These can affect how your body processes dietary fats, triglycerides, and cholesterol. Heart attack and stroke are both medical emergencies. There are many things that you can do to help prevent heart disease  and stroke:  Have your blood pressure checked at least every 1-2 years. High blood pressure causes heart disease and increases the risk of stroke.  If you are 53-22 years old, ask your health care provider if you should take aspirin to prevent a heart attack or a stroke.  Do not use any tobacco products, including cigarettes, chewing tobacco, or electronic cigarettes. If you need help quitting, ask your health care provider.  It is important to eat a healthy diet and maintain a healthy weight. ? Be sure to include plenty of vegetables, fruits, low-fat dairy products, and lean protein. ? Avoid eating foods that are high in solid fats, added sugars, or salt (sodium).  Get regular exercise. This is one of the most important things that you can do for your health. ? Try to exercise for at least 150 minutes each week. The type of exercise that you do should increase your heart rate and make you sweat. This is known as moderate-intensity exercise. ? Try to do strengthening exercises at least twice each week. Do these in addition to the moderate-intensity exercise.  Know your numbers.Ask your health care provider to check your cholesterol and your blood glucose. Continue to have your blood tested as directed by your health care provider.  What should I know about cancer screening? There are several types of cancer. Take the following steps to reduce your risk and to catch any cancer development as early as possible. Breast Cancer  Practice breast self-awareness. ? This means understanding how your breasts normally appear and feel. ? It also means doing regular breast self-exams. Let your health care provider know about any changes, no matter how small.  If you are 40  or older, have a clinician do a breast exam (clinical breast exam or CBE) every year. Depending on your age, family history, and medical history, it may be recommended that you also have a yearly breast X-ray (mammogram).  If you  have a family history of breast cancer, talk with your health care provider about genetic screening.  If you are at high risk for breast cancer, talk with your health care provider about having an MRI and a mammogram every year.  Breast cancer (BRCA) gene test is recommended for women who have family members with BRCA-related cancers. Results of the assessment will determine the need for genetic counseling and BRCA1 and for BRCA2 testing. BRCA-related cancers include these types: ? Breast. This occurs in males or females. ? Ovarian. ? Tubal. This may also be called fallopian tube cancer. ? Cancer of the abdominal or pelvic lining (peritoneal cancer). ? Prostate. ? Pancreatic.  Cervical, Uterine, and Ovarian Cancer Your health care provider may recommend that you be screened regularly for cancer of the pelvic organs. These include your ovaries, uterus, and vagina. This screening involves a pelvic exam, which includes checking for microscopic changes to the surface of your cervix (Pap test).  For women ages 21-65, health care providers may recommend a pelvic exam and a Pap test every three years. For women ages 79-65, they may recommend the Pap test and pelvic exam, combined with testing for human papilloma virus (HPV), every five years. Some types of HPV increase your risk of cervical cancer. Testing for HPV may also be done on women of any age who have unclear Pap test results.  Other health care providers may not recommend any screening for nonpregnant women who are considered low risk for pelvic cancer and have no symptoms. Ask your health care provider if a screening pelvic exam is right for you.  If you have had past treatment for cervical cancer or a condition that could lead to cancer, you need Pap tests and screening for cancer for at least 20 years after your treatment. If Pap tests have been discontinued for you, your risk factors (such as having a new sexual partner) need to be  reassessed to determine if you should start having screenings again. Some women have medical problems that increase the chance of getting cervical cancer. In these cases, your health care provider may recommend that you have screening and Pap tests more often.  If you have a family history of uterine cancer or ovarian cancer, talk with your health care provider about genetic screening.  If you have vaginal bleeding after reaching menopause, tell your health care provider.  There are currently no reliable tests available to screen for ovarian cancer.  Lung Cancer Lung cancer screening is recommended for adults 69-62 years old who are at high risk for lung cancer because of a history of smoking. A yearly low-dose CT scan of the lungs is recommended if you:  Currently smoke.  Have a history of at least 30 pack-years of smoking and you currently smoke or have quit within the past 15 years. A pack-year is smoking an average of one pack of cigarettes per day for one year.  Yearly screening should:  Continue until it has been 15 years since you quit.  Stop if you develop a health problem that would prevent you from having lung cancer treatment.  Colorectal Cancer  This type of cancer can be detected and can often be prevented.  Routine colorectal cancer screening usually begins at  age 42 and continues through age 45.  If you have risk factors for colon cancer, your health care provider may recommend that you be screened at an earlier age.  If you have a family history of colorectal cancer, talk with your health care provider about genetic screening.  Your health care provider may also recommend using home test kits to check for hidden blood in your stool.  A small camera at the end of a tube can be used to examine your colon directly (sigmoidoscopy or colonoscopy). This is done to check for the earliest forms of colorectal cancer.  Direct examination of the colon should be repeated every  5-10 years until age 71. However, if early forms of precancerous polyps or small growths are found or if you have a family history or genetic risk for colorectal cancer, you may need to be screened more often.  Skin Cancer  Check your skin from head to toe regularly.  Monitor any moles. Be sure to tell your health care provider: ? About any new moles or changes in moles, especially if there is a change in a mole's shape or color. ? If you have a mole that is larger than the size of a pencil eraser.  If any of your family members has a history of skin cancer, especially at a young age, talk with your health care provider about genetic screening.  Always use sunscreen. Apply sunscreen liberally and repeatedly throughout the day.  Whenever you are outside, protect yourself by wearing long sleeves, pants, a wide-brimmed hat, and sunglasses.  What should I know about osteoporosis? Osteoporosis is a condition in which bone destruction happens more quickly than new bone creation. After menopause, you may be at an increased risk for osteoporosis. To help prevent osteoporosis or the bone fractures that can happen because of osteoporosis, the following is recommended:  If you are 46-71 years old, get at least 1,000 mg of calcium and at least 600 mg of vitamin D per day.  If you are older than age 55 but younger than age 65, get at least 1,200 mg of calcium and at least 600 mg of vitamin D per day.  If you are older than age 54, get at least 1,200 mg of calcium and at least 800 mg of vitamin D per day.  Smoking and excessive alcohol intake increase the risk of osteoporosis. Eat foods that are rich in calcium and vitamin D, and do weight-bearing exercises several times each week as directed by your health care provider. What should I know about how menopause affects my mental health? Depression may occur at any age, but it is more common as you become older. Common symptoms of depression  include:  Low or sad mood.  Changes in sleep patterns.  Changes in appetite or eating patterns.  Feeling an overall lack of motivation or enjoyment of activities that you previously enjoyed.  Frequent crying spells.  Talk with your health care provider if you think that you are experiencing depression. What should I know about immunizations? It is important that you get and maintain your immunizations. These include:  Tetanus, diphtheria, and pertussis (Tdap) booster vaccine.  Influenza every year before the flu season begins.  Pneumonia vaccine.  Shingles vaccine.  Your health care provider may also recommend other immunizations. This information is not intended to replace advice given to you by your health care provider. Make sure you discuss any questions you have with your health care provider. Document Released: 07/10/2005  Document Revised: 12/06/2015 Document Reviewed: 02/19/2015 [employer] Interactive Patient Education  2018 [employer]  

## 2018-03-11 NOTE — Progress Notes (Addendum)
BP 108/73   Pulse (!) 56   Temp 99 F (37.2 C) (Oral)   Ht 5' 1.2" (1.554 m)   Wt 215 lb 8 oz (97.8 kg)   LMP  (LMP Unknown)   SpO2 97%   BMI 40.45 kg/m    Subjective:    Patient ID: Maureen King, female    DOB: 1955/09/05, 62 y.o.   MRN: [id number]  HPI: Maureen King is a 62 y.o. female presenting on 03/11/2018 for comprehensive medical examination. Current medical complaints include:  Has had a lot of social issues, has been under a lot more stress and being more depressed.  DEPRESSION- her father got very sick and passed away. Now taking care of her mother. Has a lot of stress. Feels like her paxil is working, but may not be working enough.  Mood status: fluctuating Satisfied with current treatment?: no Symptom severity: moderate  Duration of current treatment : chronic Side effects: no Medication compliance: excellent compliance Psychotherapy/counseling: no  Previous psychiatric medications: paxil Depressed mood: yes Anxious mood: yes Anhedonia: no Significant weight loss or gain: yes Insomnia: yes hard to fall asleep Fatigue: yes Feelings of worthlessness or guilt: yes Impaired concentration/indecisiveness: yes Suicidal ideations: no Hopelessness: no Crying spells: yes Depression screen [facility] 2/9 03/11/2018 03/08/2017 02/21/2016 02/19/2015  Decreased Interest 1 0 0 0  Down, Depressed, Hopeless 1 0 0 0  PHQ - 2 Score 2 0 0 0  Altered sleeping 2 0 - -  Tired, decreased energy 1 0 - -  Change in appetite 2 1 - -  Feeling bad or failure about yourself  1 0 - -  Trouble concentrating 1 0 - -  Moving slowly or fidgety/restless 0 0 - -  Suicidal thoughts 0 0 - -  PHQ-9 Score 9 1 - -  Difficult doing work/chores Very difficult - - -   GAD 7 : Generalized Anxiety Score 03/11/2018  Nervous, Anxious, on Edge 1  Control/stop worrying 1  Worry too much - different things 1  Trouble relaxing 1  Restless 1  Easily annoyed or irritable 1  Afraid - awful  might happen 1  Total GAD 7 Score 7  Anxiety Difficulty Somewhat difficult    She currently lives with: husband Menopausal Symptoms: no  Depression Screen done today and results listed below:  Depression screen [facility] 2/9 03/11/2018 03/08/2017 02/21/2016 02/19/2015  Decreased Interest 1 0 0 0  Down, Depressed, Hopeless 1 0 0 0  PHQ - 2 Score 2 0 0 0  Altered sleeping 2 0 - -  Tired, decreased energy 1 0 - -  Change in appetite 2 1 - -  Feeling bad or failure about yourself  1 0 - -  Trouble concentrating 1 0 - -  Moving slowly or fidgety/restless 0 0 - -  Suicidal thoughts 0 0 - -  PHQ-9 Score 9 1 - -  Difficult doing work/chores Very difficult - - -    Past Medical History:  Past Medical History:  Diagnosis Date  . Anxiety   . Depression   . GERD (gastroesophageal reflux disease)   . Insomnia   . Obesity     Surgical History:  Past Surgical History:  Procedure Laterality Date  . NO PAST SURGERIES      Medications:  Current Outpatient Medications on File Prior to Visit  Medication Sig  . aspirin 81 MG tablet Take 81 mg by mouth daily.  . Calcium Carbonate-Vit D-Min (CALCIUM 600 +  MINERALS PO) Take 600 mg by mouth daily.  . cholecalciferol (VITAMIN D) 1000 UNITS tablet Take 1,000 Units by mouth daily.  . Magnesium 250 MG TABS Take 250 mg by mouth daily.  . vitamin B-12 (CYANOCOBALAMIN) 250 MCG tablet Take 250 mcg by mouth daily.   No current facility-administered medications on file prior to visit.     Allergies:  Allergies  Allergen Reactions  . Penicillins Rash  . Neosporin [Neomycin-Polymyxin-Gramicidin]     Social History:  Social History   Socioeconomic History  . Marital status: Married    Spouse name: Not on file  . Number of children: Not on file  . Years of education: Not on file  . Highest education level: Not on file  Occupational History  . Not on file  Social Needs  . Financial resource strain: Not on file  . Food insecurity:    Worry:  Not on file    Inability: Not on file  . Transportation needs:    Medical: Not on file    Non-medical: Not on file  Tobacco Use  . Smoking status: Never Smoker  . Smokeless tobacco: Never Used  Substance and Sexual Activity  . Alcohol use: Yes    Alcohol/week: 0.0 standard drinks    Comment: on occasion (4x/year)  . Drug use: No  . Sexual activity: Yes  Lifestyle  . Physical activity:    Days per week: Not on file    Minutes per session: Not on file  . Stress: Not on file  Relationships  . Social connections:    Talks on phone: Not on file    Gets together: Not on file    Attends religious service: Not on file    Active member of club or organization: Not on file    Attends meetings of clubs or organizations: Not on file    Relationship status: Not on file  . Intimate partner violence:    Fear of current or ex partner: Not on file    Emotionally abused: Not on file    Physically abused: Not on file    Forced sexual activity: Not on file  Other Topics Concern  . Not on file  Social History Narrative  . Not on file   Social History   Tobacco Use  Smoking Status Never Smoker  Smokeless Tobacco Never Used   Social History   Substance and Sexual Activity  Alcohol Use Yes  . Alcohol/week: 0.0 standard drinks   Comment: on occasion (4x/year)    Family History:  Family History  Problem Relation Age of Onset  . Hypertension Mother   . Osteoporosis Mother   . Heart disease Father 47       Stent  . Lung disease Father   . Idiopathic pulmonary fibrosis Father   . Hypertension Brother   . Cancer Maternal Grandmother        gallbladder  . Heart disease Paternal Grandfather        MI    Past medical history, surgical history, medications, allergies, family history and social history reviewed with patient today and changes made to appropriate areas of the chart.   Review of Systems  Constitutional: Negative.   HENT: Negative.   Eyes: Negative.   Respiratory:  Negative.   Cardiovascular: Positive for palpitations. Negative for chest pain, orthopnea, claudication, leg swelling and PND.  Gastrointestinal: Negative.   Genitourinary: Negative.   Musculoskeletal: Negative.   Skin: Negative.   Endo/Heme/Allergies: Negative.  All other ROS negative except what is listed above and in the HPI.      Objective:    BP 108/73   Pulse (!) 56   Temp 99 F (37.2 C) (Oral)   Ht 5' 1.2" (1.554 m)   Wt 215 lb 8 oz (97.8 kg)   LMP  (LMP Unknown)   SpO2 97%   BMI 40.45 kg/m   Wt Readings from Last 3 Encounters:  03/11/18 215 lb 8 oz (97.8 kg)  06/16/17 202 lb 8 oz (91.9 kg)  03/08/17 199 lb 9.6 oz (90.5 kg)    Physical Exam  Constitutional: She is oriented to person, place, and time. She appears well-developed and well-nourished. No distress.  HENT:  Head: Normocephalic and atraumatic.  Right Ear: Hearing, tympanic membrane, external ear and ear canal normal.  Left Ear: Hearing, tympanic membrane, external ear and ear canal normal.  Nose: Nose normal.  Mouth/Throat: Uvula is midline, oropharynx is clear and moist and mucous membranes are normal. No oropharyngeal exudate.  Eyes: Pupils are equal, round, and reactive to light. Conjunctivae, EOM and lids are normal. Right eye exhibits no discharge. Left eye exhibits no discharge. No scleral icterus.  Neck: Normal range of motion. Neck supple. No JVD present. No tracheal deviation present. No thyromegaly present.  Cardiovascular: Normal rate, regular rhythm, normal heart sounds and intact distal pulses. Exam reveals no gallop and no friction rub.  No murmur heard. Pulmonary/Chest: Effort normal and breath sounds normal. No stridor. No respiratory distress. She has no wheezes. She has no rales. She exhibits no tenderness. Right breast exhibits no inverted nipple, no mass, no nipple discharge, no skin change and no tenderness. Left breast exhibits no inverted nipple, no mass, no nipple discharge, no skin  change and no tenderness. No breast swelling, tenderness, discharge or bleeding. Breasts are symmetrical.  Abdominal: Soft. Bowel sounds are normal. She exhibits no distension and no mass. There is no tenderness. There is no rebound and no guarding. No hernia.  Genitourinary:  Genitourinary Comments: Pelvic exam deferred with shared decision making  Musculoskeletal: Normal range of motion. She exhibits no edema, tenderness or deformity.  Lymphadenopathy:    She has no cervical adenopathy.  Neurological: She is alert and oriented to person, place, and time. She displays normal reflexes. No cranial nerve deficit or sensory deficit. She exhibits normal muscle tone. Coordination normal.  Skin: Skin is warm, dry and intact. Capillary refill takes less than 2 seconds. No rash noted. She is not diaphoretic. No erythema. No pallor.  Psychiatric: She has a normal mood and affect. Her speech is normal and behavior is normal. Judgment and thought content normal. Cognition and memory are normal.  Nursing note and vitals reviewed. Breast exam done today with Gerda Diss, CMA in attendance.  .  Results for orders placed or performed in visit on 03/08/17  Comprehensive metabolic panel  Result Value Ref Range   Glucose 89 65 - 99 mg/dL   BUN 16 8 - 27 mg/dL   Creatinine, Ser 0.67 0.57 - 1.00 mg/dL   GFR calc non Af Amer 96 >59 mL/min/1.73   GFR calc Af Amer 110 >59 mL/min/1.73   BUN/Creatinine Ratio 24 12 - 28   Sodium 140 134 - 144 mmol/L   Potassium 4.2 3.5 - 5.2 mmol/L   Chloride 103 96 - 106 mmol/L   CO2 25 20 - 29 mmol/L   Calcium 9.4 8.7 - 10.3 mg/dL   Total Protein 6.0 6.0 - 8.5 g/dL  Albumin 4.2 3.6 - 4.8 g/dL   Globulin, Total 1.8 1.5 - 4.5 g/dL   Albumin/Globulin Ratio 2.3 (H) 1.2 - 2.2   Bilirubin Total 0.3 0.0 - 1.2 mg/dL   Alkaline Phosphatase 94 39 - 117 IU/L   AST 26 0 - 40 IU/L   ALT 25 0 - 32 IU/L  Lipid Panel w/o Chol/HDL Ratio  Result Value Ref Range   Cholesterol, Total 200  (H) 100 - 199 mg/dL   Triglycerides 86 0 - 149 mg/dL   HDL 97 >39 mg/dL   VLDL Cholesterol Cal 17 5 - 40 mg/dL   LDL Calculated 86 0 - 99 mg/dL  TSH  Result Value Ref Range   TSH 1.130 0.450 - 4.500 uIU/mL  CBC with Differential/Platelet  Result Value Ref Range   WBC 4.1 3.4 - 10.8 x10E3/uL   RBC 4.30 3.77 - 5.28 x10E6/uL   Hemoglobin 13.1 11.1 - 15.9 g/dL   Hematocrit 37.3 34.0 - 46.6 %   MCV 87 79 - 97 fL   MCH 30.5 26.6 - 33.0 pg   MCHC 35.1 31.5 - 35.7 g/dL   RDW 13.1 12.3 - 15.4 %   Platelets 244 150 - 379 x10E3/uL   Neutrophils 64 Not Estab. %   Lymphs 23 Not Estab. %   Monocytes 8 Not Estab. %   Eos 4 Not Estab. %   Basos 1 Not Estab. %   Neutrophils Absolute 2.6 1.4 - 7.0 x10E3/uL   Lymphocytes Absolute 1.0 0.7 - 3.1 x10E3/uL   Monocytes Absolute 0.3 0.1 - 0.9 x10E3/uL   EOS (ABSOLUTE) 0.2 0.0 - 0.4 x10E3/uL   Basophils Absolute 0.0 0.0 - 0.2 x10E3/uL   Immature Granulocytes 0 Not Estab. %   Immature Grans (Abs) 0.0 0.0 - 0.1 x10E3/uL  Hepatitis C antibody  Result Value Ref Range   Hep C Virus Ab <0.1 0.0 - 0.9 s/co ratio  VITAMIN D 25 Hydroxy (Vit-D Deficiency, Fractures)  Result Value Ref Range   Vit D, 25-Hydroxy 40.3 30.0 - 100.0 ng/mL      Assessment & Plan:   Problem List Items Addressed This Visit      Other   Morbid obesity ([place])    Will rejoin weight watchers. Call with any concerns.       Relevant Orders   Ambulatory referral to Sleep Studies   Bayer DCA Hb A1c Waived   Depression    Not under good control. Will increase her paxil to 25mg  and recheck in 1 month. Call with any concerns.       Relevant Medications   PARoxetine (PAXIL-CR) 25 MG 24 hr tablet   Other Relevant Orders   Ambulatory referral to Sleep Studies   Palpitations    Wakes up with rapid heartbeat when she is sleeping. Not sleeping well. Offered referral for sleep study- she is interested. Order placed.      Relevant Orders   Ambulatory referral to Sleep Studies      Other Visit Diagnoses    Routine general medical examination at a health care facility    -  Primary   Vaccines up to date. Screening labs checked today. Pap due next year. Mammogram ordered. Colonoscopy ordered. Call with any concerns.    Relevant Orders   Bayer DCA Hb A1c Waived   CBC with Differential/Platelet   Comprehensive metabolic panel   Lipid Panel w/o Chol/HDL Ratio   TSH   UA/M w/rflx Culture, Routine   Colon cancer screening  Referral to GI made today.   Relevant Orders   Ambulatory referral to Gastroenterology   Flu vaccine need       Flu shot given today.   Relevant Orders   Flu Vaccine QUAD 36+ mos IM (Completed)   Screening for breast cancer       Mammogram ordered today.   Relevant Orders   MM DIGITAL SCREENING BILATERAL       Follow up plan: Return in about 4 weeks (around 04/08/2018) for follow up mood.   LABORATORY TESTING:  - Pap smear: up to date  IMMUNIZATIONS:   - Tdap: Tetanus vaccination status reviewed: last tetanus booster within 10 years. - Influenza: Administered today - Pneumovax: Not applicable  SCREENING: -Mammogram: Ordered today  - Colonoscopy: Ordered today   PATIENT COUNSELING:   Advised to take 1 mg of folate supplement per day if capable of pregnancy.   Sexuality: Discussed sexually transmitted diseases, partner selection, use of condoms, avoidance of unintended pregnancy  and contraceptive alternatives.   Advised to avoid cigarette smoking.  I discussed with the patient that most people either abstain from alcohol or drink within safe limits (<=14/week and <=4 drinks/occasion for males, <=7/weeks and <= 3 drinks/occasion for females) and that the risk for alcohol disorders and other health effects rises proportionally with the number of drinks per week and how often a drinker exceeds daily limits.  Discussed cessation/primary prevention of drug use and availability of treatment for abuse.   Diet: Encouraged to adjust  caloric intake to maintain  or achieve ideal body weight, to reduce intake of dietary saturated fat and total fat, to limit sodium intake by avoiding high sodium foods and not adding table salt, and to maintain adequate dietary potassium and calcium preferably from fresh fruits, vegetables, and low-fat dairy products.    stressed the importance of regular exercise  Injury prevention: Discussed safety belts, safety helmets, smoke detector, smoking near bedding or upholstery.   Dental health: Discussed importance of regular tooth brushing, flossing, and dental visits.    NEXT PREVENTATIVE PHYSICAL DUE IN 1 YEAR. Return in about 4 weeks (around 04/08/2018) for follow up mood.

## 2018-03-11 NOTE — Assessment & Plan Note (Addendum)
Wakes up with rapid heartbeat when she is sleeping. Not sleeping well. Offered referral for sleep study- she is interested. Order placed.

## 2018-03-11 NOTE — Assessment & Plan Note (Signed)
Not under good control. Will increase her paxil to 25mg  and recheck in 1 month. Call with any concerns.

## 2018-03-11 NOTE — Assessment & Plan Note (Signed)
Will rejoin weight watchers. Call with any concerns.

## 2018-03-12 ENCOUNTER — Encounter: Payer: Self-pay | Admitting: Family Medicine

## 2018-03-12 LAB — LIPID PANEL W/O CHOL/HDL RATIO
CHOLESTEROL TOTAL: 196 mg/dL (ref 100–199)
HDL: 83 mg/dL (ref >39)
LDL Calculated: 89 mg/dL (ref 0–99)
TRIGLYCERIDES: 118 mg/dL (ref 0–149)
VLDL Cholesterol Cal: 24 mg/dL (ref 5–40)

## 2018-03-12 LAB — COMPREHENSIVE METABOLIC PANEL
A/G RATIO: 2.3 — AB (ref 1.2–2.2)
ALBUMIN: 4.1 g/dL (ref 3.6–4.8)
ALK PHOS: 101 IU/L (ref 39–117)
ALT: 15 IU/L (ref 0–32)
AST: 16 IU/L (ref 0–40)
BILIRUBIN TOTAL: 0.3 mg/dL (ref 0.0–1.2)
BUN/Creatinine Ratio: 26 (ref 12–28)
BUN: 20 mg/dL (ref 8–27)
CO2: 25 mmol/L (ref 20–29)
CREATININE: 0.76 mg/dL (ref 0.57–1.00)
Calcium: 9.6 mg/dL (ref 8.7–10.3)
Chloride: 100 mmol/L (ref 96–106)
GFR calc Af Amer: 98 mL/min/{1.73_m2} (ref >59)
GFR, EST NON AFRICAN AMERICAN: 85 mL/min/{1.73_m2} (ref >59)
Globulin, Total: 1.8 g/dL (ref 1.5–4.5)
Glucose: 90 mg/dL (ref 65–99)
Potassium: 4.8 mmol/L (ref 3.5–5.2)
Sodium: 139 mmol/L (ref 134–144)
TOTAL PROTEIN: 5.9 g/dL — AB (ref 6.0–8.5)

## 2018-03-12 LAB — CBC WITH DIFFERENTIAL/PLATELET
Basophils Absolute: 0.1 10*3/uL (ref 0.0–0.2)
Basos: 1 %
EOS (ABSOLUTE): 0.2 10*3/uL (ref 0.0–0.4)
Eos: 3 %
HEMOGLOBIN: 13.4 g/dL (ref 11.1–15.9)
Hematocrit: 39.3 % (ref 34.0–46.6)
IMMATURE GRANS (ABS): 0 10*3/uL (ref 0.0–0.1)
Immature Granulocytes: 0 %
LYMPHS: 12 %
Lymphocytes Absolute: 0.8 10*3/uL (ref 0.7–3.1)
MCH: 30.2 pg (ref 26.6–33.0)
MCHC: 34.1 g/dL (ref 31.5–35.7)
MCV: 89 fL (ref 79–97)
Monocytes Absolute: 0.5 10*3/uL (ref 0.1–0.9)
Monocytes: 8 %
NEUTROS ABS: 4.8 10*3/uL (ref 1.4–7.0)
Neutrophils: 76 %
Platelets: 282 10*3/uL (ref 150–450)
RBC: 4.44 x10E6/uL (ref 3.77–5.28)
RDW: 12.6 % (ref 12.3–15.4)
WBC: 6.3 10*3/uL (ref 3.4–10.8)

## 2018-03-12 LAB — TSH: TSH: 1.38 u[IU]/mL (ref 0.450–4.500)

## 2018-03-17 ENCOUNTER — Telehealth: Payer: Self-pay | Admitting: Family Medicine

## 2018-03-17 NOTE — Telephone Encounter (Signed)
Can we please check to see what she needs?

## 2018-03-17 NOTE — Telephone Encounter (Signed)
Copied from [place] [phone]. Topic: General - Other >> Mar 17, 2018  8:37 AM Janace Aris A wrote: Reason for CRM: Pt called in wanting to have someone give her a call back regarding her lab results, she says  she has some questions about it and she would like to have something's explained.    Please advise

## 2018-03-17 NOTE — Telephone Encounter (Signed)
Called and spoke with patient. Read lab letter to patient. She was just concerned about the slightly elevated protein.

## 2018-04-19 ENCOUNTER — Ambulatory Visit
Admission: RE | Admit: 2018-04-19 | Discharge: 2018-04-19 | Disposition: A | Discharge status: Home / Self Care | Payer: 59 | Source: Ambulatory Visit | Attending: Family Medicine | Admitting: Family Medicine

## 2018-04-19 DIAGNOSIS — Z1239 Encounter for other screening for malignant neoplasm of breast: Secondary | ICD-10-CM

## 2018-04-20 ENCOUNTER — Other Ambulatory Visit: Payer: Self-pay | Admitting: Unknown Physician Specialty

## 2018-04-20 ENCOUNTER — Encounter: Payer: Self-pay | Admitting: Family Medicine

## 2018-06-20 LAB — HM COLONOSCOPY

## 2018-10-07 ENCOUNTER — Other Ambulatory Visit: Payer: Self-pay | Admitting: Family Medicine

## 2018-10-07 NOTE — Telephone Encounter (Signed)
Can this patient received a refill on this medication?

## 2018-10-10 NOTE — Telephone Encounter (Signed)
Needs follow up appointment.  

## 2018-10-10 NOTE — Telephone Encounter (Signed)
Tried to reach patient to schedule fu.Mailbox full could not leave a message

## 2018-12-12 ENCOUNTER — Telehealth: Payer: Self-pay | Admitting: Family Medicine

## 2018-12-12 ENCOUNTER — Telehealth: Payer: 59 | Admitting: Physician Assistant

## 2018-12-12 ENCOUNTER — Encounter: Payer: Self-pay | Admitting: Physician Assistant

## 2018-12-12 NOTE — Progress Notes (Signed)
Based on what you shared with me, I feel your condition warrants further evaluation and I recommend that you be seen for a face to face office visit.  NOTE: If you entered your credit card information for this eVisit, you will not be charged. You may see a "hold" on your card for the $35 but that hold will drop off and you will not have a charge processed.  Please call your doctor's office or send them a direct message as advised.   If you are having a true medical emergency please call 911.     For an urgent face to face visit, [place] has five urgent care centers for your convenience:    [email or web address] to reserve your spot online an avoid wait times  [facility] [address], [address] [place], [place] [address] Modified hours of operation: Monday-Friday, 12 PM to 6 PM  Closed Saturday & Sunday  *Across the street from [place] (New Address!) [address] [place], [place] [address] *Just off [address], across the road from [place] hours of operation: Monday-Friday, 12 PM to 6 PM  Closed Saturday & Sunday   The following sites will take your insurance:  . [facility]    [phone]                  Get Driving Directions  [address], [place] [address] . 10 am to 8 pm Monday-Friday . 12 pm to 8 pm Saturday-Sunday   . [facility] at [employer]                  Get Driving Directions  [address], [place], [place] [address] . 8 am to 8 pm Monday-Friday . 9 am to 6 pm Saturday . 11 am to 6 pm Sunday   . [facility] at [place]                  Get Driving Directions   [address], [place] [address] . 8 am to 8 pm Monday-Friday . 8 am to 4 pm Saturday-Sunday    . [facility] at [place]                    Get  Driving Directions  [phone]  [address], [place],  [address]  . Monday-Friday, 12 PM to 6 PM    Your e-visit answers were reviewed by a board certified advanced clinical practitioner to complete your personal care plan.  Thank you for using e-Visits. I spent 5-10 minutes on review and completion of this note- Lacy Duverney [facility]

## 2018-12-12 NOTE — Telephone Encounter (Signed)
OK for either- it would be good if e can see her today, so any way that will work

## 2018-12-12 NOTE — Telephone Encounter (Signed)
Pt called in stating that she has been dealing with headaches, red eyes, and her forehead/scalp is tender to the touch. Thinks it may be shingles per md she saw via her work. She saw insta care today they are suggesting that she is seen face to face. Talked to [place] she is wondering if you would be ok seeing her face to face or would you prefer to do a virtual visit. Please advise.

## 2018-12-12 NOTE — Telephone Encounter (Signed)
Pt cannot come in today, she is at work. Scheduled for tomorrow.

## 2018-12-13 ENCOUNTER — Ambulatory Visit (INDEPENDENT_AMBULATORY_CARE_PROVIDER_SITE_OTHER): Payer: Managed Care, Other (non HMO) | Admitting: Family Medicine

## 2018-12-13 ENCOUNTER — Other Ambulatory Visit: Payer: Self-pay

## 2018-12-13 ENCOUNTER — Encounter: Payer: Self-pay | Admitting: Family Medicine

## 2018-12-13 VITALS — BP 122/79 | HR 67

## 2018-12-13 DIAGNOSIS — B0229 Other postherpetic nervous system involvement: Secondary | ICD-10-CM

## 2018-12-13 DIAGNOSIS — H1013 Acute atopic conjunctivitis, bilateral: Secondary | ICD-10-CM | POA: Diagnosis not present

## 2018-12-13 MED ORDER — OLOPATADINE HCL 0.2 % OP SOLN: 1.0000 [drp] | Freq: Two times a day (BID) | OPHTHALMIC | 12 refills | Status: DC

## 2018-12-13 MED ORDER — NORTRIPTYLINE HCL 10 MG PO CAPS: 10.0000 mg | ORAL_CAPSULE | Freq: Every day | ORAL | 0 refills | Status: DC

## 2018-12-13 NOTE — Patient Instructions (Signed)
Postherpetic Neuralgia Postherpetic neuralgia (PHN) is nerve pain that occurs after a shingles infection. Shingles is a painful rash that appears on one area of the body, usually on the trunk or face. Shingles is caused by the varicella-zoster virus. This is the same virus that causes chickenpox. In people who have had chickenpox, the virus can resurface years later and cause shingles. You may have PHN if you continue to have pain for 4 months after your shingles rash has gone away. PHN appears in the same area where you had the shingles rash. The pain usually goes away after the rash disappears. Getting a vaccination for shingles can prevent PHN. This vaccine is recommended for people older than 60. It may prevent shingles, and may also lower your risk of PHN if you do get shingles. What are the causes? This condition is caused by damage to your nerves from the varicella-zoster virus. The damage makes your nerves overly sensitive. What increases the risk? The following factors may make you more likely to develop this condition:  Being older than 62 years of age.  Having severe pain before your shingles rash starts.  Having a severe rash.  Having shingles in and around the eye area.  Having a disease that makes your body unable to fight infections (weak immune system). What are the signs or symptoms? The main symptom of this condition is pain. The pain may:  Often be very bad and may be described as stabbing, burning, or feeling like an electric shock.  Come and go or may be there all the time.  Be triggered by light touches on the skin or changes in temperature. You may have itching along with the pain. How is this diagnosed? This condition may be diagnosed based on your symptoms and your history of shingles. Lab studies and other diagnostic tests are usually not needed. How is this treated? There is no cure for this condition. Treatment for PHN will focus on pain relief.  Over-the-counter pain relievers do not usually relieve PHN pain. You may need to work with a pain specialist. Treatment may include:  Antidepressant medicines to help with pain and improve sleep.  Anti-seizure medicines to relieve nerve pain.  Strong pain relievers (opioids).  A numbing patch worn on the skin (lidocaine patch).  Botox (botulinum toxin) injections to block pain signals between nerves and muscles.  Injections of numbing medicine or anti-inflammatory medicines around irritated nerves. Follow these instructions at home:   It may take a long time to recover from PHN. Work closely with your health care provider and develop a good support system at home.  Take over-the-counter and prescription medicines only as told by your health care provider.  Do not drive or use heavy machinery while taking prescription pain medicine.  Wear loose, comfortable clothing.  Cover sensitive areas with a dressing to reduce friction from clothing rubbing on the area.  If directed, put ice on the painful area: ? Put ice in a plastic bag. ? Place a towel between your skin and the bag. ? Leave the ice on for 20 minutes, 2-3 times a day.  Talk to your health care provider if you feel depressed or desperate. Living with long-term pain can be depressing.  Keep all follow-up visits as told by your health care provider. This is important. Contact a health care provider if:  Your medicine is not helping.  You are struggling to manage your pain at home. Summary  Postherpetic neuralgia is a very painful disorder   that can occur after an episode of shingles.  The pain is often severe, burning, electric, or stabbing.  Prescription medicines can be helpful in managing persistent pain.  Getting a vaccination for shingles can prevent PHN. This vaccine is recommended for people older than 60. This information is not intended to replace advice given to you by your health care provider. Make sure  you discuss any questions you have with your health care provider. Document Released: 08/08/2002 Document Revised: 04/30/2017 Document Reviewed: 08/04/2016 Elsevier Patient Education  2020 [employer]  

## 2018-12-13 NOTE — Progress Notes (Signed)
BP 122/79   Pulse 67   LMP  (LMP Unknown)   SpO2 96%    Subjective:    Patient ID: Maureen King, female    DOB: 08-06-1955, 63 y.o.   MRN: [id number]  HPI: BERNIECE King is a 63 y.o. female  Chief Complaint  Patient presents with  . Herpes Zoster    Was seen by MD live 1 week again diagnosed with Shingles, started on Valtrex, states pain is better but not gone, right side of head neck shoulder   Diagnosed with shingles about a week ago due to pain in a dermatomal pattern on her R side of her face and head. She was started on valtrex and has been feeling better, but continues to have pain occasionally in the R side of her face. Has not had a rash. She is feeling a bit better, but continues with pain.   She also notes that when she stays at her mother's house, she wakes up in the AM with red, itchy eyes. She notes that there was a leak in the roof there and she is concerned about mold.   She is otherwise doing well with no other concerns or complaints at this time.   Relevant past medical, surgical, family and social history reviewed and updated as indicated. Interim medical history since our last visit reviewed. Allergies and medications reviewed and updated.  Review of Systems  Constitutional: Negative.   HENT: Negative.   Eyes: Positive for redness and itching. Negative for photophobia, pain, discharge and visual disturbance.  Respiratory: Negative.   Cardiovascular: Negative.   Skin: Positive for rash. Negative for color change, pallor and wound.  Neurological: Positive for headaches. Negative for dizziness, tremors, seizures, syncope, facial asymmetry, speech difficulty, weakness, light-headedness and numbness.  Psychiatric/Behavioral: Negative.     Per HPI unless specifically indicated above     Objective:    BP 122/79   Pulse 67   LMP  (LMP Unknown)   SpO2 96%   Wt Readings from Last 3 Encounters:  03/11/18 215 lb 8 oz (97.8 kg)  06/16/17 202 lb 8  oz (91.9 kg)  03/08/17 199 lb 9.6 oz (90.5 kg)    Physical Exam Vitals signs and nursing note reviewed.  Constitutional:      General: She is not in acute distress.    Appearance: Normal appearance. She is not ill-appearing, toxic-appearing or diaphoretic.  HENT:     Head: Normocephalic and atraumatic.     Right Ear: External ear normal.     Left Ear: External ear normal.     Nose: Nose normal.     Mouth/Throat:     Mouth: Mucous membranes are moist.     Pharynx: Oropharynx is clear.  Eyes:     General: No scleral icterus.       Right eye: No discharge.        Left eye: No discharge.     Extraocular Movements: Extraocular movements intact.     Conjunctiva/sclera: Conjunctivae normal.     Pupils: Pupils are equal, round, and reactive to light.  Neck:     Musculoskeletal: Normal range of motion and neck supple.  Cardiovascular:     Rate and Rhythm: Normal rate and regular rhythm.     Pulses: Normal pulses.     Heart sounds: Normal heart sounds. No murmur. No friction rub. No gallop.   Pulmonary:     Effort: Pulmonary effort is normal. No respiratory distress.  Breath sounds: Normal breath sounds. No stridor. No wheezing, rhonchi or rales.  Chest:     Chest wall: No tenderness.  Musculoskeletal: Normal range of motion.  Skin:    General: Skin is warm and dry.     Capillary Refill: Capillary refill takes less than 2 seconds.     Coloration: Skin is not jaundiced or pale.     Findings: No bruising, erythema, lesion or rash.  Neurological:     General: No focal deficit present.     Mental Status: She is alert and oriented to person, place, and time. Mental status is at baseline.  Psychiatric:        Mood and Affect: Mood normal.        Behavior: Behavior normal.        Thought Content: Thought content normal.        Judgment: Judgment normal.     Results for orders placed or performed in visit on 03/11/18  Microscopic Examination   BLD  Result Value Ref Range    WBC, UA 0-5 0 - 5 /hpf   RBC, UA 0-2 0 - 2 /hpf   Epithelial Cells (non renal) 0-10 0 - 10 /hpf   Bacteria, UA Few None seen/Few  Bayer DCA Hb A1c Waived  Result Value Ref Range   HB A1C (BAYER DCA - WAIVED) 5.0 <7.0 %  CBC with Differential/Platelet  Result Value Ref Range   WBC 6.3 3.4 - 10.8 x10E3/uL   RBC 4.44 3.77 - 5.28 x10E6/uL   Hemoglobin 13.4 11.1 - 15.9 g/dL   Hematocrit 39.3 34.0 - 46.6 %   MCV 89 79 - 97 fL   MCH 30.2 26.6 - 33.0 pg   MCHC 34.1 31.5 - 35.7 g/dL   RDW 12.6 12.3 - 15.4 %   Platelets 282 150 - 450 x10E3/uL   Neutrophils 76 Not Estab. %   Lymphs 12 Not Estab. %   Monocytes 8 Not Estab. %   Eos 3 Not Estab. %   Basos 1 Not Estab. %   Neutrophils Absolute 4.8 1.4 - 7.0 x10E3/uL   Lymphocytes Absolute 0.8 0.7 - 3.1 x10E3/uL   Monocytes Absolute 0.5 0.1 - 0.9 x10E3/uL   EOS (ABSOLUTE) 0.2 0.0 - 0.4 x10E3/uL   Basophils Absolute 0.1 0.0 - 0.2 x10E3/uL   Immature Granulocytes 0 Not Estab. %   Immature Grans (Abs) 0.0 0.0 - 0.1 x10E3/uL  Comprehensive metabolic panel  Result Value Ref Range   Glucose 90 65 - 99 mg/dL   BUN 20 8 - 27 mg/dL   Creatinine, Ser 0.76 0.57 - 1.00 mg/dL   GFR calc non Af Amer 85 >59 mL/min/1.73   GFR calc Af Amer 98 >59 mL/min/1.73   BUN/Creatinine Ratio 26 12 - 28   Sodium 139 134 - 144 mmol/L   Potassium 4.8 3.5 - 5.2 mmol/L   Chloride 100 96 - 106 mmol/L   CO2 25 20 - 29 mmol/L   Calcium 9.6 8.7 - 10.3 mg/dL   Total Protein 5.9 (L) 6.0 - 8.5 g/dL   Albumin 4.1 3.6 - 4.8 g/dL   Globulin, Total 1.8 1.5 - 4.5 g/dL   Albumin/Globulin Ratio 2.3 (H) 1.2 - 2.2   Bilirubin Total 0.3 0.0 - 1.2 mg/dL   Alkaline Phosphatase 101 39 - 117 IU/L   AST 16 0 - 40 IU/L   ALT 15 0 - 32 IU/L  Lipid Panel w/o Chol/HDL Ratio  Result Value Ref Range   Cholesterol,  Total 196 100 - 199 mg/dL   Triglycerides 118 0 - 149 mg/dL   HDL 83 >39 mg/dL   VLDL Cholesterol Cal 24 5 - 40 mg/dL   LDL Calculated 89 0 - 99 mg/dL  TSH  Result Value  Ref Range   TSH 1.380 0.450 - 4.500 uIU/mL  UA/M w/rflx Culture, Routine   Specimen: Blood   BLD  Result Value Ref Range   Specific Gravity, UA 1.010 1.005 - 1.030   pH, UA 6.0 5.0 - 7.5   Color, UA Yellow Yellow   Appearance Ur Clear Clear   Leukocytes, UA 1+ (A) Negative   Protein, UA Negative Negative/Trace   Glucose, UA Negative Negative   Ketones, UA Negative Negative   RBC, UA Negative Negative   Bilirubin, UA Negative Negative   Urobilinogen, Ur 0.2 0.2 - 1.0 mg/dL   Nitrite, UA Negative Negative   Microscopic Examination See below:       Assessment & Plan:   Problem List Items Addressed This Visit    None    Visit Diagnoses    Postherpetic neuralgia    -  Primary   No rash. Will treat remaining pain with nortriptyline for 1 month. Call with any concerns. Recheck 1 month.    Allergic conjunctivitis of both eyes       Will start pataday. Call if not getting better or getting worse.        Follow up plan: Return in about 4 weeks (around 01/10/2019).

## 2019-01-09 ENCOUNTER — Other Ambulatory Visit: Payer: Self-pay | Admitting: Family Medicine

## 2019-01-09 NOTE — Telephone Encounter (Signed)
Requested Prescriptions  Pending Prescriptions Disp Refills  . nortriptyline (PAMELOR) 10 MG capsule [Pharmacy Med Name: NORTRIPTYLINE HCL 10 MG CAP] 30 capsule 0    Sig: TAKE 1 CAPSULE (10 MG TOTAL) BY MOUTH AT BEDTIME.     Psychiatry:  Antidepressants - Heterocyclics (TCAs) Passed - 01/09/2019  1:31 AM      Passed - Valid encounter within last 6 months    Recent Outpatient Visits          3 weeks ago Postherpetic neuralgia   [place], [place], DO   10 months ago Routine general medical examination at a health care facility   [facility], [place], DO   1 year ago Class 2 obesity due to excess calories without serious comorbidity with body mass index (BMI) of 39.0 to 39.9 in adult   [place], NP   2 years ago Obesity   [facility] Kathrine Haddock, NP   3 years ago UTI (lower urinary tract infection)   [facility] Volney American, [place]      Future Appointments            In 1 week Wynetta Emery, Barb Merino, DO [employer], [facility]   In 2 months Johnson, Megan P, DO [place], [place] - Completed PHQ-2 or PHQ-9 in the last 360 days.

## 2019-01-20 ENCOUNTER — Ambulatory Visit: Payer: Self-pay | Admitting: Family Medicine

## 2019-02-10 ENCOUNTER — Other Ambulatory Visit: Payer: Self-pay | Admitting: Family Medicine

## 2019-02-14 ENCOUNTER — Telehealth: Payer: Self-pay

## 2019-02-14 MED ORDER — PAROXETINE HCL ER 12.5 MG PO TB24: 12.5000 mg | ORAL_TABLET | Freq: Every day | ORAL | 0 refills | Status: DC

## 2019-02-14 NOTE — Telephone Encounter (Signed)
Patient has been taking this daily, she has not stopped taking it. She has a follow up in October.

## 2019-02-14 NOTE — Telephone Encounter (Signed)
Incoming fax Patient is requesting a prescription for paroxetine ER, I do not see this on the patients current medication list.

## 2019-02-14 NOTE — Telephone Encounter (Signed)
It looks like her last Rx for this was in May and she hasn't been taking it, can we confirm that she hasn't been taking it, or if she would like to restart it- if she does I can see her tomorrow to get her restarted.

## 2019-02-14 NOTE — Telephone Encounter (Signed)
Perfect- can we confirm dose so I can get refill for her as it's not on her medication list.

## 2019-02-14 NOTE — Telephone Encounter (Addendum)
Patient stated the medication is 12.5 mg Pharmacy confirmed as [facility] mail service

## 2019-03-15 ENCOUNTER — Other Ambulatory Visit: Payer: Self-pay

## 2019-03-16 ENCOUNTER — Encounter: Payer: Self-pay | Admitting: Family Medicine

## 2019-03-16 ENCOUNTER — Ambulatory Visit (INDEPENDENT_AMBULATORY_CARE_PROVIDER_SITE_OTHER): Payer: Managed Care, Other (non HMO) | Admitting: Family Medicine

## 2019-03-16 ENCOUNTER — Other Ambulatory Visit (HOSPITAL_COMMUNITY)
Admission: RE | Admit: 2019-03-16 | Discharge: 2019-03-16 | Disposition: A | Discharge status: Home / Self Care | Payer: Managed Care, Other (non HMO) | Source: Ambulatory Visit | Attending: Family Medicine | Admitting: Family Medicine

## 2019-03-16 ENCOUNTER — Other Ambulatory Visit: Payer: Self-pay

## 2019-03-16 VITALS — BP 119/70 | HR 60 | Temp 98.5°F | Ht 60.0 in | Wt 230.0 lb

## 2019-03-16 DIAGNOSIS — Z124 Encounter for screening for malignant neoplasm of cervix: Secondary | ICD-10-CM

## 2019-03-16 DIAGNOSIS — Z23 Encounter for immunization: Secondary | ICD-10-CM

## 2019-03-16 DIAGNOSIS — Z0000 Encounter for general adult medical examination without abnormal findings: Secondary | ICD-10-CM | POA: Diagnosis not present

## 2019-03-16 DIAGNOSIS — F3341 Major depressive disorder, recurrent, in partial remission: Secondary | ICD-10-CM | POA: Diagnosis not present

## 2019-03-16 DIAGNOSIS — Z1231 Encounter for screening mammogram for malignant neoplasm of breast: Secondary | ICD-10-CM | POA: Diagnosis not present

## 2019-03-16 DIAGNOSIS — K219 Gastro-esophageal reflux disease without esophagitis: Secondary | ICD-10-CM | POA: Diagnosis not present

## 2019-03-16 MED ORDER — PAROXETINE HCL ER 25 MG PO TB24: 25.0000 mg | ORAL_TABLET | Freq: Every day | ORAL | 1 refills | Status: DC

## 2019-03-16 NOTE — Progress Notes (Signed)
BP 119/70   Pulse 60   Temp 98.5 F (36.9 C) (Oral)   Ht 5' (1.524 m)   Wt 230 lb (104.3 kg)   LMP  (LMP Unknown)   SpO2 96%   BMI 44.92 kg/m    Subjective:    Patient ID: Maureen King, female    DOB: 18-Sep-1955, 63 y.o.   MRN: [id number]  HPI: Maureen King is a 63 y.o. female presenting on 03/16/2019 for comprehensive medical examination. Current medical complaints include:  DEPRESSION- caring for her mother who has some dementia and dealing with social issues with her siblings. Stress at work  Mood status: exacerbated Satisfied with current treatment?: no Symptom severity: moderate  Duration of current treatment : chronic Side effects: no Medication compliance: excellent compliance Psychotherapy/counseling: no  Previous psychiatric medications: paxil Depressed mood: yes Anxious mood: yes Anhedonia: no Significant weight loss or gain: yes Insomnia: yes  Fatigue: yes Feelings of worthlessness or guilt: yes Impaired concentration/indecisiveness: no Suicidal ideations: no Hopelessness: no Crying spells: yes Depression screen [facility] 2/9 03/16/2019 03/11/2018 03/08/2017 02/21/2016 02/19/2015  Decreased Interest 1 1 0 0 0  Down, Depressed, Hopeless 1 1 0 0 0  PHQ - 2 Score 2 2 0 0 0  Altered sleeping 1 2 0 - -  Tired, decreased energy 1 1 0 - -  Change in appetite 1 2 1  - -  Feeling bad or failure about yourself  0 1 0 - -  Trouble concentrating 0 1 0 - -  Moving slowly or fidgety/restless 0 0 0 - -  Suicidal thoughts 0 0 0 - -  PHQ-9 Score 5 9 1  - -  Difficult doing work/chores Somewhat difficult Very difficult - - -   GAD 7 : Generalized Anxiety Score 03/16/2019 03/11/2018  Nervous, Anxious, on Edge 2 1  Control/stop worrying 1 1  Worry too much - different things 1 1  Trouble relaxing 1 1  Restless 1 1  Easily annoyed or irritable 1 1  Afraid - awful might happen 0 1  Total GAD 7 Score 7 7  Anxiety Difficulty Somewhat difficult Somewhat difficult     GERD GERD control status: controlled  Satisfied with current treatment? yes Heartburn frequency: never Medication side effects: no  Medication compliance: excellent Dysphagia: no Odynophagia:  no Hematemesis: no Blood in stool: no EGD: no  Menopausal Symptoms: no  Depression Screen done today and results listed below:  Depression screen [facility] 2/9 03/16/2019 03/11/2018 03/08/2017 02/21/2016 02/19/2015  Decreased Interest 1 1 0 0 0  Down, Depressed, Hopeless 1 1 0 0 0  PHQ - 2 Score 2 2 0 0 0  Altered sleeping 1 2 0 - -  Tired, decreased energy 1 1 0 - -  Change in appetite 1 2 1  - -  Feeling bad or failure about yourself  0 1 0 - -  Trouble concentrating 0 1 0 - -  Moving slowly or fidgety/restless 0 0 0 - -  Suicidal thoughts 0 0 0 - -  PHQ-9 Score 5 9 1  - -  Difficult doing work/chores Somewhat difficult Very difficult - - -    Past Medical History:  Past Medical History:  Diagnosis Date  . Anxiety   . Depression   . GERD (gastroesophageal reflux disease)   . Insomnia   . Obesity     Surgical History:  Past Surgical History:  Procedure Laterality Date  . NO PAST SURGERIES      Medications:  Current Outpatient Medications on File Prior to Visit  Medication Sig  . aspirin 81 MG tablet Take 81 mg by mouth daily.  . Calcium Carbonate-Vit D-Min (CALCIUM 600 + MINERALS PO) Take 600 mg by mouth daily.  . cholecalciferol (VITAMIN D) 1000 UNITS tablet Take 2,000 Units by mouth daily.   . Magnesium 250 MG TABS Take 250 mg by mouth daily.  . Olopatadine HCl 0.2 % SOLN Apply 1 drop to eye 2 (two) times a day.  . vitamin B-12 (CYANOCOBALAMIN) 250 MCG tablet Take 250 mcg by mouth daily.   No current facility-administered medications on file prior to visit.     Allergies:  Allergies  Allergen Reactions  . Penicillins Rash  . Neosporin [Neomycin-Polymyxin-Gramicidin]     Social History:  Social History   Socioeconomic History  . Marital status: Married     Spouse name: Not on file  . Number of children: Not on file  . Years of education: Not on file  . Highest education level: Not on file  Occupational History  . Not on file  Social Needs  . Financial resource strain: Not on file  . Food insecurity    Worry: Not on file    Inability: Not on file  . Transportation needs    Medical: Not on file    Non-medical: Not on file  Tobacco Use  . Smoking status: Never Smoker  . Smokeless tobacco: Never Used  Substance and Sexual Activity  . Alcohol use: Yes    Alcohol/week: 0.0 standard drinks    Comment: on occasion (4x/year)  . Drug use: No  . Sexual activity: Yes  Lifestyle  . Physical activity    Days per week: Not on file    Minutes per session: Not on file  . Stress: Not on file  Relationships  . Social Herbalist on phone: Not on file    Gets together: Not on file    Attends religious service: Not on file    Active member of club or organization: Not on file    Attends meetings of clubs or organizations: Not on file    Relationship status: Not on file  . Intimate partner violence    Fear of current or ex partner: Not on file    Emotionally abused: Not on file    Physically abused: Not on file    Forced sexual activity: Not on file  Other Topics Concern  . Not on file  Social History Narrative  . Not on file   Social History   Tobacco Use  Smoking Status Never Smoker  Smokeless Tobacco Never Used   Social History   Substance and Sexual Activity  Alcohol Use Yes  . Alcohol/week: 0.0 standard drinks   Comment: on occasion (4x/year)    Family History:  Family History  Problem Relation Age of Onset  . Hypertension Mother   . Osteoporosis Mother   . Heart disease Father 65       Stent  . Lung disease Father   . Idiopathic pulmonary fibrosis Father   . Hypertension Brother   . Cancer Maternal Grandmother        gallbladder  . Heart disease Paternal Grandfather        MI  . Breast cancer Neg Hx      Past medical history, surgical history, medications, allergies, family history and social history reviewed with patient today and changes made to appropriate areas of the chart.   Review  of Systems  Constitutional: Negative.   HENT: Negative.   Eyes: Positive for redness. Negative for blurred vision, double vision, photophobia, pain and discharge.  Respiratory: Negative.   Cardiovascular: Positive for palpitations. Negative for chest pain, orthopnea, claudication, leg swelling and PND.  Gastrointestinal: Positive for heartburn. Negative for abdominal pain, blood in stool, constipation, diarrhea, melena, nausea and vomiting.  Genitourinary: Negative.   Musculoskeletal: Negative.   Skin: Negative.   Neurological: Negative.   Endo/Heme/Allergies: Negative.   Psychiatric/Behavioral: Positive for depression. Negative for hallucinations, memory loss, substance abuse and suicidal ideas. The patient is nervous/anxious. The patient does not have insomnia.     All other ROS negative except what is listed above and in the HPI.      Objective:    BP 119/70   Pulse 60   Temp 98.5 F (36.9 C) (Oral)   Ht 5' (1.524 m)   Wt 230 lb (104.3 kg)   LMP  (LMP Unknown)   SpO2 96%   BMI 44.92 kg/m   Wt Readings from Last 3 Encounters:  03/16/19 230 lb (104.3 kg)  03/11/18 215 lb 8 oz (97.8 kg)  06/16/17 202 lb 8 oz (91.9 kg)    Physical Exam Vitals signs and nursing note reviewed. Exam conducted with a chaperone present.  Constitutional:      General: She is not in acute distress.    Appearance: Normal appearance. She is not ill-appearing, toxic-appearing or diaphoretic.  HENT:     Head: Normocephalic and atraumatic.     Right Ear: Tympanic membrane, ear canal and external ear normal. There is no impacted cerumen.     Left Ear: Tympanic membrane, ear canal and external ear normal. There is no impacted cerumen.     Nose: Nose normal. No congestion or rhinorrhea.     Mouth/Throat:      Mouth: Mucous membranes are moist.     Pharynx: Oropharynx is clear. No oropharyngeal exudate or posterior oropharyngeal erythema.  Eyes:     General: No scleral icterus.       Right eye: No discharge.        Left eye: No discharge.     Extraocular Movements: Extraocular movements intact.     Conjunctiva/sclera: Conjunctivae normal.     Pupils: Pupils are equal, round, and reactive to light.  Neck:     Musculoskeletal: Normal range of motion and neck supple. No neck rigidity or muscular tenderness.     Vascular: No carotid bruit.  Cardiovascular:     Rate and Rhythm: Normal rate and regular rhythm.     Pulses: Normal pulses.     Heart sounds: No murmur. No friction rub. No gallop.   Pulmonary:     Effort: Pulmonary effort is normal. No respiratory distress.     Breath sounds: Normal breath sounds. No stridor. No wheezing, rhonchi or rales.  Chest:     Chest wall: No tenderness.     Breasts:        Right: Normal. No swelling, bleeding, inverted nipple, mass, nipple discharge, skin change or tenderness.        Left: Normal. No swelling, bleeding, inverted nipple, mass, nipple discharge, skin change or tenderness.  Abdominal:     General: Abdomen is flat. Bowel sounds are normal. There is no distension.     Palpations: Abdomen is soft. There is no mass.     Tenderness: There is no abdominal tenderness. There is no right CVA tenderness, left CVA tenderness, guarding or rebound.  Hernia: No hernia is present.  Genitourinary:    General: Normal vulva.     Labia:        Right: No rash, tenderness, lesion or injury.        Left: No rash, tenderness, lesion or injury.      Urethra: No prolapse, urethral pain, urethral swelling or urethral lesion.     Vagina: Normal.     Cervix: Normal.     Uterus: Normal.   Musculoskeletal:        General: No swelling, tenderness, deformity or signs of injury.     Right lower leg: No edema.     Left lower leg: No edema.  Lymphadenopathy:      Cervical: No cervical adenopathy.     Upper Body:     Right upper body: No supraclavicular, axillary or pectoral adenopathy.     Left upper body: No supraclavicular, axillary or pectoral adenopathy.  Skin:    General: Skin is warm and dry.     Capillary Refill: Capillary refill takes less than 2 seconds.     Coloration: Skin is not jaundiced or pale.     Findings: No bruising, erythema, lesion or rash.  Neurological:     General: No focal deficit present.     Mental Status: She is alert and oriented to person, place, and time. Mental status is at baseline.     Cranial Nerves: No cranial nerve deficit.     Sensory: No sensory deficit.     Motor: No weakness.     Coordination: Coordination normal.     Gait: Gait normal.     Deep Tendon Reflexes: Reflexes normal.  Psychiatric:        Mood and Affect: Mood normal.        Behavior: Behavior normal.        Thought Content: Thought content normal.        Judgment: Judgment normal.     Results for orders placed or performed in visit on 03/11/18  Microscopic Examination   BLD  Result Value Ref Range   WBC, UA 0-5 0 - 5 /hpf   RBC, UA 0-2 0 - 2 /hpf   Epithelial Cells (non renal) 0-10 0 - 10 /hpf   Bacteria, UA Few None seen/Few  Bayer DCA Hb A1c Waived  Result Value Ref Range   HB A1C (BAYER DCA - WAIVED) 5.0 <7.0 %  CBC with Differential/Platelet  Result Value Ref Range   WBC 6.3 3.4 - 10.8 x10E3/uL   RBC 4.44 3.77 - 5.28 x10E6/uL   Hemoglobin 13.4 11.1 - 15.9 g/dL   Hematocrit 39.3 34.0 - 46.6 %   MCV 89 79 - 97 fL   MCH 30.2 26.6 - 33.0 pg   MCHC 34.1 31.5 - 35.7 g/dL   RDW 12.6 12.3 - 15.4 %   Platelets 282 150 - 450 x10E3/uL   Neutrophils 76 Not Estab. %   Lymphs 12 Not Estab. %   Monocytes 8 Not Estab. %   Eos 3 Not Estab. %   Basos 1 Not Estab. %   Neutrophils Absolute 4.8 1.4 - 7.0 x10E3/uL   Lymphocytes Absolute 0.8 0.7 - 3.1 x10E3/uL   Monocytes Absolute 0.5 0.1 - 0.9 x10E3/uL   EOS (ABSOLUTE) 0.2 0.0 - 0.4  x10E3/uL   Basophils Absolute 0.1 0.0 - 0.2 x10E3/uL   Immature Granulocytes 0 Not Estab. %   Immature Grans (Abs) 0.0 0.0 - 0.1 x10E3/uL  Comprehensive metabolic panel  Result  Value Ref Range   Glucose 90 65 - 99 mg/dL   BUN 20 8 - 27 mg/dL   Creatinine, Ser 0.76 0.57 - 1.00 mg/dL   GFR calc non Af Amer 85 >59 mL/min/1.73   GFR calc Af Amer 98 >59 mL/min/1.73   BUN/Creatinine Ratio 26 12 - 28   Sodium 139 134 - 144 mmol/L   Potassium 4.8 3.5 - 5.2 mmol/L   Chloride 100 96 - 106 mmol/L   CO2 25 20 - 29 mmol/L   Calcium 9.6 8.7 - 10.3 mg/dL   Total Protein 5.9 (L) 6.0 - 8.5 g/dL   Albumin 4.1 3.6 - 4.8 g/dL   Globulin, Total 1.8 1.5 - 4.5 g/dL   Albumin/Globulin Ratio 2.3 (H) 1.2 - 2.2   Bilirubin Total 0.3 0.0 - 1.2 mg/dL   Alkaline Phosphatase 101 39 - 117 IU/L   AST 16 0 - 40 IU/L   ALT 15 0 - 32 IU/L  Lipid Panel w/o Chol/HDL Ratio  Result Value Ref Range   Cholesterol, Total 196 100 - 199 mg/dL   Triglycerides 118 0 - 149 mg/dL   HDL 83 >39 mg/dL   VLDL Cholesterol Cal 24 5 - 40 mg/dL   LDL Calculated 89 0 - 99 mg/dL  TSH  Result Value Ref Range   TSH 1.380 0.450 - 4.500 uIU/mL  UA/M w/rflx Culture, Routine   Specimen: Blood   BLD  Result Value Ref Range   Specific Gravity, UA 1.010 1.005 - 1.030   pH, UA 6.0 5.0 - 7.5   Color, UA Yellow Yellow   Appearance Ur Clear Clear   Leukocytes, UA 1+ (A) Negative   Protein, UA Negative Negative/Trace   Glucose, UA Negative Negative   Ketones, UA Negative Negative   RBC, UA Negative Negative   Bilirubin, UA Negative Negative   Urobilinogen, Ur 0.2 0.2 - 1.0 mg/dL   Nitrite, UA Negative Negative   Microscopic Examination See below:       Assessment & Plan:   Problem List Items Addressed This Visit      Digestive   GERD (gastroesophageal reflux disease)    Under good control on current regimen. Continue current regimen. Continue to monitor. Call with any concerns. Refills given.        Relevant Orders   CBC  with Differential/Platelet   Comprehensive metabolic panel     Other   Morbid obesity ([place])    Encouraged diet and exercise with goal of losing 1-2lbs per week. Continue to monitor. Call with any concerns.       Depression    Not doing well on current regimen, feeling overwhelmed. Will increase her paxil to 25mg  daily and recheck 1 month. Call with any concerns.       Relevant Medications   PARoxetine (PAXIL-CR) 25 MG 24 hr tablet   Other Relevant Orders   Comprehensive metabolic panel   TSH    Other Visit Diagnoses    Routine general medical examination at a health care facility    -  Primary   Vaccines up to date. Screening labs checked today. Mammogram ordered. Pap done. Colonoscopy up to date. Continue diet and exercise. Call with concerns.    Relevant Orders   CBC with Differential/Platelet   Comprehensive metabolic panel   Lipid Panel w/o Chol/HDL Ratio   TSH   UA/M w/rflx Culture, Routine   SAR CoV2 Serology (COVID 19)AB(IGG)IA   Flu vaccine need       Flu  shot given today.   Relevant Orders   Flu Vaccine QUAD 36+ mos IM (Completed)   Encounter for screening mammogram for malignant neoplasm of breast       Mammogram ordered today.    Relevant Orders   MM DIGITAL SCREENING BILATERAL   Screening for cervical cancer       Pap done today.   Relevant Orders   Cytology - PAP       Follow up plan: Return in about 4 weeks (around 04/13/2019) for follow up mood.   LABORATORY TESTING:  - Pap smear: pap done  IMMUNIZATIONS:   - Tdap: Tetanus vaccination status reviewed: last tetanus booster within 10 years. - Influenza: Administered today - Pneumovax: Not applicable  SCREENING: -Mammogram: Ordered today  - Colonoscopy: Up to date  - Bone Density: Not applicable   PATIENT COUNSELING:   Advised to take 1 mg of folate supplement per day if capable of pregnancy.   Sexuality: Discussed sexually transmitted diseases, partner selection, use of condoms, avoidance of  unintended pregnancy  and contraceptive alternatives.   Advised to avoid cigarette smoking.  I discussed with the patient that most people either abstain from alcohol or drink within safe limits (<=14/week and <=4 drinks/occasion for males, <=7/weeks and <= 3 drinks/occasion for females) and that the risk for alcohol disorders and other health effects rises proportionally with the number of drinks per week and how often a drinker exceeds daily limits.  Discussed cessation/primary prevention of drug use and availability of treatment for abuse.   Diet: Encouraged to adjust caloric intake to maintain  or achieve ideal body weight, to reduce intake of dietary saturated fat and total fat, to limit sodium intake by avoiding high sodium foods and not adding table salt, and to maintain adequate dietary potassium and calcium preferably from fresh fruits, vegetables, and low-fat dairy products.    stressed the importance of regular exercise  Injury prevention: Discussed safety belts, safety helmets, smoke detector, smoking near bedding or upholstery.   Dental health: Discussed importance of regular tooth brushing, flossing, and dental visits.    NEXT PREVENTATIVE PHYSICAL DUE IN 1 YEAR. Return in about 4 weeks (around 04/13/2019) for follow up mood.

## 2019-03-16 NOTE — Assessment & Plan Note (Signed)
Under good control on current regimen. Continue current regimen. Continue to monitor. Call with any concerns. Refills given.   

## 2019-03-16 NOTE — Patient Instructions (Addendum)
Call to schedule your mammogram:  [facility]  Address: [address], [place], [place] [address]  Phone: [phone]   Health Maintenance for Postmenopausal Women Menopause is a normal process in which your ability to get pregnant comes to an end. This process happens slowly over many months or years, usually between the ages of 75 and 50. Menopause is complete when you have missed your menstrual periods for 12 months. It is important to talk with your health care provider about some of the most common conditions that affect women after menopause (postmenopausal women). These include heart disease, cancer, and bone loss (osteoporosis). Adopting a healthy lifestyle and getting preventive care can help to promote your health and wellness. The actions you take can also lower your chances of developing some of these common conditions. What should I know about menopause? During menopause, you may get a number of symptoms, such as:  Hot flashes. These can be moderate or severe.  Night sweats.  Decrease in sex drive.  Mood swings.  Headaches.  Tiredness.  Irritability.  Memory problems.  Insomnia. Choosing to treat or not to treat these symptoms is a decision that you make with your health care provider. Do I need hormone replacement therapy?  Hormone replacement therapy is effective in treating symptoms that are caused by menopause, such as hot flashes and night sweats.  Hormone replacement carries certain risks, especially as you become older. If you are thinking about using estrogen or estrogen with progestin, discuss the benefits and risks with your health care provider. What is my risk for heart disease and stroke? The risk of heart disease, heart attack, and stroke increases as you age. One of the causes may be a change in the body's hormones during menopause. This can affect how your body uses dietary fats, triglycerides, and cholesterol.  Heart attack and stroke are medical emergencies. There are many things that you can do to help prevent heart disease and stroke. Watch your blood pressure  High blood pressure causes heart disease and increases the risk of stroke. This is more likely to develop in people who have high blood pressure readings, are of African descent, or are overweight.  Have your blood pressure checked: ? Every 3-5 years if you are 3-3 years of age. ? Every year if you are 35 years old or older. Eat a healthy diet   Eat a diet that includes plenty of vegetables, fruits, low-fat dairy products, and lean protein.  Do not eat a lot of foods that are high in solid fats, added sugars, or sodium. Get regular exercise Get regular exercise. This is one of the most important things you can do for your health. Most adults should:  Try to exercise for at least 150 minutes each week. The exercise should increase your heart rate and make you sweat (moderate-intensity exercise).  Try to do strengthening exercises at least twice each week. Do these in addition to the moderate-intensity exercise.  Spend less time sitting. Even light physical activity can be beneficial. Other tips  Work with your health care provider to achieve or maintain a healthy weight.  Do not use any products that contain nicotine or tobacco, such as cigarettes, e-cigarettes, and chewing tobacco. If you need help quitting, ask your health care provider.  Know your numbers. Ask your health care provider to check your cholesterol and your blood sugar (glucose). Continue to have your blood tested as directed by your health care provider.  Do I need screening for cancer? Depending on your health history and family history, you may need to have cancer screening at different stages of your life. This may include screening for:  Breast cancer.  Cervical cancer.  Lung cancer.  Colorectal cancer. What is my risk for osteoporosis? After menopause,  you may be at increased risk for osteoporosis. Osteoporosis is a condition in which bone destruction happens more quickly than new bone creation. To help prevent osteoporosis or the bone fractures that can happen because of osteoporosis, you may take the following actions:  If you are 88-50 years old, get at least 1,000 mg of calcium and at least 600 mg of vitamin D per day.  If you are older than age 52 but younger than age 64, get at least 1,200 mg of calcium and at least 600 mg of vitamin D per day.  If you are older than age 68, get at least 1,200 mg of calcium and at least 800 mg of vitamin D per day. Smoking and drinking excessive alcohol increase the risk of osteoporosis. Eat foods that are rich in calcium and vitamin D, and do weight-bearing exercises several times each week as directed by your health care provider. How does menopause affect my mental health? Depression may occur at any age, but it is more common as you become older. Common symptoms of depression include:  Low or sad mood.  Changes in sleep patterns.  Changes in appetite or eating patterns.  Feeling an overall lack of motivation or enjoyment of activities that you previously enjoyed.  Frequent crying spells. Talk with your health care provider if you think that you are experiencing depression. General instructions See your health care provider for regular wellness exams and vaccines. This may include:  Scheduling regular health, dental, and eye exams.  Getting and maintaining your vaccines. These include: ? Influenza vaccine. Get this vaccine each year before the flu season begins. ? Pneumonia vaccine. ? Shingles vaccine. ? Tetanus, diphtheria, and pertussis (Tdap) booster vaccine. Your health care provider may also recommend other immunizations. Tell your health care provider if you have ever been abused or do not feel safe at home. Summary  Menopause is a normal process in which your ability to get  pregnant comes to an end.  This condition causes hot flashes, night sweats, decreased interest in sex, mood swings, headaches, or lack of sleep.  Treatment for this condition may include hormone replacement therapy.  Take actions to keep yourself healthy, including exercising regularly, eating a healthy diet, watching your weight, and checking your blood pressure and blood sugar levels.  Get screened for cancer and depression. Make sure that you are up to date with all your vaccines. This information is not intended to replace advice given to you by your health care provider. Make sure you discuss any questions you have with your health care provider. Document Released: 07/10/2005 Document Revised: 05/11/2018 Document Reviewed: 05/11/2018 Elsevier Patient Education  2020 [employer].

## 2019-03-16 NOTE — Assessment & Plan Note (Signed)
Encouraged diet and exercise with goal of losing 1-2lbs per week. Continue to monitor. Call with any concerns.  

## 2019-03-16 NOTE — Assessment & Plan Note (Signed)
Not doing well on current regimen, feeling overwhelmed. Will increase her paxil to 25mg  daily and recheck 1 month. Call with any concerns.

## 2019-03-17 LAB — CBC WITH DIFFERENTIAL/PLATELET
Basophils Absolute: 0 10*3/uL (ref 0.0–0.2)
Basos: 1 %
EOS (ABSOLUTE): 0.1 10*3/uL (ref 0.0–0.4)
Eos: 2 %
Hematocrit: 42.5 % (ref 34.0–46.6)
Hemoglobin: 14.5 g/dL (ref 11.1–15.9)
Immature Grans (Abs): 0 10*3/uL (ref 0.0–0.1)
Immature Granulocytes: 1 %
Lymphocytes Absolute: 1.4 10*3/uL (ref 0.7–3.1)
Lymphs: 21 %
MCH: 30.2 pg (ref 26.6–33.0)
MCHC: 34.1 g/dL (ref 31.5–35.7)
MCV: 89 fL (ref 79–97)
Monocytes Absolute: 0.4 10*3/uL (ref 0.1–0.9)
Monocytes: 6 %
Neutrophils Absolute: 4.6 10*3/uL (ref 1.4–7.0)
Neutrophils: 69 %
Platelets: 285 10*3/uL (ref 150–450)
RBC: 4.8 x10E6/uL (ref 3.77–5.28)
RDW: 12.7 % (ref 11.7–15.4)
WBC: 6.5 10*3/uL (ref 3.4–10.8)

## 2019-03-17 LAB — COMPREHENSIVE METABOLIC PANEL
ALT: 21 IU/L (ref 0–32)
AST: 24 IU/L (ref 0–40)
Albumin/Globulin Ratio: 2.4 — ABNORMAL HIGH (ref 1.2–2.2)
Albumin: 4.4 g/dL (ref 3.8–4.8)
Alkaline Phosphatase: 108 IU/L (ref 39–117)
BUN/Creatinine Ratio: 25 (ref 12–28)
BUN: 17 mg/dL (ref 8–27)
Bilirubin Total: 0.3 mg/dL (ref 0.0–1.2)
CO2: 24 mmol/L (ref 20–29)
Calcium: 10.1 mg/dL (ref 8.7–10.3)
Chloride: 100 mmol/L (ref 96–106)
Creatinine, Ser: 0.69 mg/dL (ref 0.57–1.00)
GFR calc Af Amer: 108 mL/min/{1.73_m2} (ref >59)
GFR calc non Af Amer: 94 mL/min/{1.73_m2} (ref >59)
Globulin, Total: 1.8 g/dL (ref 1.5–4.5)
Glucose: 88 mg/dL (ref 65–99)
Potassium: 4.4 mmol/L (ref 3.5–5.2)
Sodium: 138 mmol/L (ref 134–144)
Total Protein: 6.2 g/dL (ref 6.0–8.5)

## 2019-03-17 LAB — LIPID PANEL W/O CHOL/HDL RATIO
Cholesterol, Total: 226 mg/dL — ABNORMAL HIGH (ref 100–199)
HDL: 77 mg/dL (ref >39)
LDL Chol Calc (NIH): 111 mg/dL — ABNORMAL HIGH (ref 0–99)
Triglycerides: 221 mg/dL — ABNORMAL HIGH (ref 0–149)
VLDL Cholesterol Cal: 38 mg/dL (ref 5–40)

## 2019-03-17 LAB — EUROIMMUN SARS-COV-2 AB, IGG: Euroimmun SARS-CoV-2 Ab, IgG: NEGATIVE

## 2019-03-17 LAB — SAR COV2 SEROLOGY (COVID19)AB(IGG),IA

## 2019-03-17 LAB — TSH: TSH: 1.56 u[IU]/mL (ref 0.450–4.500)

## 2019-03-18 LAB — UA/M W/RFLX CULTURE, ROUTINE
Bilirubin, UA: NEGATIVE
Glucose, UA: NEGATIVE
Ketones, UA: NEGATIVE
Nitrite, UA: NEGATIVE
Protein,UA: NEGATIVE
Specific Gravity, UA: 1.015 (ref 1.005–1.030)
Urobilinogen, Ur: 0.2 mg/dL (ref 0.2–1.0)
pH, UA: 6.5 (ref 5.0–7.5)

## 2019-03-18 LAB — URINE CULTURE, REFLEX: Organism ID, Bacteria: NO GROWTH

## 2019-03-18 LAB — MICROSCOPIC EXAMINATION

## 2019-03-24 LAB — CYTOLOGY - PAP
Comment: NEGATIVE
Comment: NEGATIVE
Comment: NEGATIVE
Diagnosis: NEGATIVE
HPV 16: NEGATIVE
HPV 18 / 45: NEGATIVE
High risk HPV: POSITIVE — AB

## 2019-04-14 ENCOUNTER — Ambulatory Visit: Payer: Managed Care, Other (non HMO) | Admitting: Family Medicine

## 2019-04-21 ENCOUNTER — Other Ambulatory Visit: Payer: Self-pay | Admitting: Family Medicine

## 2019-04-21 ENCOUNTER — Ambulatory Visit
Admission: RE | Admit: 2019-04-21 | Discharge: 2019-04-21 | Disposition: A | Discharge status: Home / Self Care | Payer: Managed Care, Other (non HMO) | Source: Ambulatory Visit | Attending: Family Medicine | Admitting: Family Medicine

## 2019-04-21 DIAGNOSIS — Z1231 Encounter for screening mammogram for malignant neoplasm of breast: Secondary | ICD-10-CM | POA: Diagnosis not present

## 2019-05-29 ENCOUNTER — Encounter: Payer: Self-pay | Admitting: Family Medicine

## 2019-05-29 MED ORDER — PAROXETINE HCL ER 12.5 MG PO TB24: 12.5000 mg | ORAL_TABLET | Freq: Every day | ORAL | 1 refills | Status: DC

## 2019-07-14 ENCOUNTER — Encounter: Payer: Self-pay | Admitting: Family Medicine

## 2019-10-08 ENCOUNTER — Other Ambulatory Visit: Payer: Self-pay | Admitting: Family Medicine

## 2019-10-09 NOTE — Telephone Encounter (Signed)
Courtesy Refill

## 2019-11-07 ENCOUNTER — Other Ambulatory Visit: Payer: Self-pay

## 2019-11-08 MED ORDER — PAROXETINE HCL ER 12.5 MG PO TB24: 12.5000 mg | ORAL_TABLET | Freq: Every day | ORAL | 0 refills | Status: DC

## 2019-11-28 ENCOUNTER — Other Ambulatory Visit: Payer: Self-pay

## 2019-11-28 ENCOUNTER — Encounter: Payer: Self-pay | Admitting: Family Medicine

## 2019-11-28 ENCOUNTER — Ambulatory Visit: Payer: Managed Care, Other (non HMO) | Admitting: Family Medicine

## 2019-11-28 DIAGNOSIS — F3341 Major depressive disorder, recurrent, in partial remission: Secondary | ICD-10-CM | POA: Diagnosis not present

## 2019-11-28 MED ORDER — PAROXETINE HCL ER 12.5 MG PO TB24: 12.5000 mg | ORAL_TABLET | Freq: Every day | ORAL | 1 refills | Status: DC

## 2019-11-28 NOTE — Assessment & Plan Note (Signed)
Has rejoined weight watchers. Encouraged diet and exercise with the goal of losing 1-2lbs per week. Call with any concerns.

## 2019-11-28 NOTE — Progress Notes (Signed)
BP 121/62 (BP Location: Left Arm, Patient Position: Sitting, Cuff Size: Large)   Pulse (!) 57   Temp 97.9 F (36.6 C) (Oral)   Wt 229 lb 9.6 oz (104.1 kg)   LMP  (LMP Unknown)   SpO2 95%   BMI 44.84 kg/m    Subjective:    Patient ID: Maureen King, female    DOB: 1956-04-16, 64 y.o.   MRN: [id number]  HPI: Maureen King is a 64 y.o. female  Chief Complaint  Patient presents with  . Depression  . Anxiety   ANXIETY/STRESS Duration: Chronic Status:stable Anxious mood: yes  Excessive worrying: yes Irritability: no  Sweating: no Nausea: no Palpitations:no Hyperventilation: no Panic attacks: no Agoraphobia: no  Obscessions/compulsions: no Depressed mood: no Depression screen [facility] 2/9 03/16/2019 03/11/2018 03/08/2017 02/21/2016 02/19/2015  Decreased Interest 1 1 0 0 0  Down, Depressed, Hopeless 1 1 0 0 0  PHQ - 2 Score 2 2 0 0 0  Altered sleeping 1 2 0 - -  Tired, decreased energy 1 1 0 - -  Change in appetite 1 2 1  - -  Feeling bad or failure about yourself  0 1 0 - -  Trouble concentrating 0 1 0 - -  Moving slowly or fidgety/restless 0 0 0 - -  Suicidal thoughts 0 0 0 - -  PHQ-9 Score 5 9 1  - -  Difficult doing work/chores Somewhat difficult Very difficult - - -   Anhedonia: no Weight changes: no Insomnia: no   Hypersomnia: no Fatigue/loss of energy: no Feelings of worthlessness: no Feelings of guilt: no Impaired concentration/indecisiveness: no Suicidal ideations: no  Crying spells: no Recent Stressors/Life Changes: yes   Relationship problems: no   Family stress: no     Financial stress: no    Job stress: yes    Recent death/loss: no  Relevant past medical, surgical, family and social history reviewed and updated as indicated. Interim medical history since our last visit reviewed. Allergies and medications reviewed and updated.  Review of Systems  Constitutional: Negative.   Respiratory: Negative.   Cardiovascular: Negative.     Gastrointestinal: Negative.   Musculoskeletal: Negative.   Skin: Negative.   Psychiatric/Behavioral: Negative for agitation, behavioral problems, confusion, decreased concentration, dysphoric mood, hallucinations, self-injury, sleep disturbance and suicidal ideas. The patient is nervous/anxious. The patient is not hyperactive.     Per HPI unless specifically indicated above     Objective:    BP 121/62 (BP Location: Left Arm, Patient Position: Sitting, Cuff Size: Large)   Pulse (!) 57   Temp 97.9 F (36.6 C) (Oral)   Wt 229 lb 9.6 oz (104.1 kg)   LMP  (LMP Unknown)   SpO2 95%   BMI 44.84 kg/m   Wt Readings from Last 3 Encounters:  11/28/19 229 lb 9.6 oz (104.1 kg)  03/16/19 230 lb (104.3 kg)  03/11/18 215 lb 8 oz (97.8 kg)    Physical Exam Vitals and nursing note reviewed.  Constitutional:      General: She is not in acute distress.    Appearance: Normal appearance. She is not ill-appearing, toxic-appearing or diaphoretic.  HENT:     Head: Normocephalic and atraumatic.     Right Ear: External ear normal.     Left Ear: External ear normal.     Nose: Nose normal.     Mouth/Throat:     Mouth: Mucous membranes are moist.     Pharynx: Oropharynx is clear.  Eyes:  General: No scleral icterus.       Right eye: No discharge.        Left eye: No discharge.     Extraocular Movements: Extraocular movements intact.     Conjunctiva/sclera: Conjunctivae normal.     Pupils: Pupils are equal, round, and reactive to light.  Cardiovascular:     Rate and Rhythm: Normal rate and regular rhythm.     Pulses: Normal pulses.     Heart sounds: Normal heart sounds. No murmur heard.  No friction rub. No gallop.   Pulmonary:     Effort: Pulmonary effort is normal. No respiratory distress.     Breath sounds: Normal breath sounds. No stridor. No wheezing, rhonchi or rales.  Chest:     Chest wall: No tenderness.  Musculoskeletal:        General: Normal range of motion.     Cervical  back: Normal range of motion and neck supple.  Skin:    General: Skin is warm and dry.     Capillary Refill: Capillary refill takes less than 2 seconds.     Coloration: Skin is not jaundiced or pale.     Findings: No bruising, erythema, lesion or rash.  Neurological:     General: No focal deficit present.     Mental Status: She is alert and oriented to person, place, and time. Mental status is at baseline.  Psychiatric:        Mood and Affect: Mood normal.        Behavior: Behavior normal.        Thought Content: Thought content normal.        Judgment: Judgment normal.     Results for orders placed or performed in visit on 03/16/19  Microscopic Examination   URINE  Result Value Ref Range   WBC, UA 0-5 0 - 5 /hpf   RBC 11-30 (A) 0 - 2 /hpf   Epithelial Cells (non renal) 0-10 0 - 10 /hpf   Bacteria, UA Few (A) None seen/Few  Urine Culture, Reflex   URINE  Result Value Ref Range   Urine Culture, Routine Final report    Organism ID, Bacteria No growth   Comprehensive metabolic panel  Result Value Ref Range   Glucose 88 65 - 99 mg/dL   BUN 17 8 - 27 mg/dL   Creatinine, Ser 0.69 0.57 - 1.00 mg/dL   GFR calc non Af Amer 94 >59 mL/min/1.73   GFR calc Af Amer 108 >59 mL/min/1.73   BUN/Creatinine Ratio 25 12 - 28   Sodium 138 134 - 144 mmol/L   Potassium 4.4 3.5 - 5.2 mmol/L   Chloride 100 96 - 106 mmol/L   CO2 24 20 - 29 mmol/L   Calcium 10.1 8.7 - 10.3 mg/dL   Total Protein 6.2 6.0 - 8.5 g/dL   Albumin 4.4 3.8 - 4.8 g/dL   Globulin, Total 1.8 1.5 - 4.5 g/dL   Albumin/Globulin Ratio 2.4 (H) 1.2 - 2.2   Bilirubin Total 0.3 0.0 - 1.2 mg/dL   Alkaline Phosphatase 108 39 - 117 IU/L   AST 24 0 - 40 IU/L   ALT 21 0 - 32 IU/L  Lipid Panel w/o Chol/HDL Ratio  Result Value Ref Range   Cholesterol, Total 226 (H) 100 - 199 mg/dL   Triglycerides 221 (H) 0 - 149 mg/dL   HDL 77 >39 mg/dL   VLDL Cholesterol Cal 38 5 - 40 mg/dL   LDL Chol Calc (NIH) 111 (H)  0 - 99 mg/dL  TSH   Result Value Ref Range   TSH 1.560 0.450 - 4.500 uIU/mL  UA/M w/rflx Culture, Routine   Specimen: Urine   URINE  Result Value Ref Range   Specific Gravity, UA 1.015 1.005 - 1.030   pH, UA 6.5 5.0 - 7.5   Color, UA Yellow Yellow   Appearance Ur Clear Clear   Leukocytes,UA 1+ (A) Negative   Protein,UA Negative Negative/Trace   Glucose, UA Negative Negative   Ketones, UA Negative Negative   RBC, UA 2+ (A) Negative   Bilirubin, UA Negative Negative   Urobilinogen, Ur 0.2 0.2 - 1.0 mg/dL   Nitrite, UA Negative Negative   Microscopic Examination See below:    Urinalysis Reflex Comment   SAR CoV2 Serology (COVID 19)AB(IGG)IA  Result Value Ref Range   SARS-CoV-2 Ab, IgG Comment Negative  CBC with Differential/Platelet  Result Value Ref Range   WBC 6.5 3.4 - 10.8 x10E3/uL   RBC 4.80 3.77 - 5.28 x10E6/uL   Hemoglobin 14.5 11.1 - 15.9 g/dL   Hematocrit 42.5 34.0 - 46.6 %   MCV 89 79 - 97 fL   MCH 30.2 26.6 - 33.0 pg   MCHC 34.1 31 - 35 g/dL   RDW 12.7 11.7 - 15.4 %   Platelets 285 150 - 450 x10E3/uL   Neutrophils 69 Not Estab. %   Lymphs 21 Not Estab. %   Monocytes 6 Not Estab. %   Eos 2 Not Estab. %   Basos 1 Not Estab. %   Neutrophils Absolute 4.6 1 - 7 x10E3/uL   Lymphocytes Absolute 1.4 0 - 3 x10E3/uL   Monocytes Absolute 0.4 0 - 0 x10E3/uL   EOS (ABSOLUTE) 0.1 0.0 - 0.4 x10E3/uL   Basophils Absolute 0.0 0 - 0 x10E3/uL   Immature Granulocytes 1 Not Estab. %   Immature Grans (Abs) 0.0 0.0 - 0.1 x10E3/uL  Euroimmun SARS-CoV-2 Ab, IgG  Result Value Ref Range   Euroimmun SARS-CoV-2 Ab, IgG Negative Negative  Cytology - PAP  Result Value Ref Range   High risk HPV Positive (A)    HPV 16 Negative    HPV 18 / 45 Negative    Adequacy      Satisfactory for evaluation; transformation zone component PRESENT.   Diagnosis      - Negative for intraepithelial lesion or malignancy (NILM)   Comment Normal Reference Range HPV - Negative    Comment Normal Reference Range HPV 16 18  45 -Negative    Comment Normal Reference Range HPV 16- Negative       Assessment & Plan:   Problem List Items Addressed This Visit      Other   Morbid obesity ([place])    Has rejoined weight watchers. Encouraged diet and exercise with the goal of losing 1-2lbs per week. Call with any concerns.       Depression    Under good control on current regimen. Continue current regimen. Continue to monitor. Call with any concerns. Refills given.        Relevant Medications   PARoxetine (PAXIL-CR) 12.5 MG 24 hr tablet       Follow up plan: Return in about 6 months (around 05/29/2020) for physical.

## 2019-11-28 NOTE — Assessment & Plan Note (Signed)
Under good control on current regimen. Continue current regimen. Continue to monitor. Call with any concerns. Refills given.   

## 2020-02-09 ENCOUNTER — Encounter: Payer: Self-pay | Admitting: Family Medicine

## 2020-04-12 ENCOUNTER — Encounter: Payer: Self-pay | Admitting: Family Medicine

## 2020-04-12 ENCOUNTER — Ambulatory Visit: Payer: Managed Care, Other (non HMO) | Admitting: Family Medicine

## 2020-04-12 ENCOUNTER — Other Ambulatory Visit: Payer: Self-pay

## 2020-04-12 VITALS — BP 114/75 | HR 62 | Temp 98.1°F | Ht 60.0 in | Wt 225.6 lb

## 2020-04-12 DIAGNOSIS — F3341 Major depressive disorder, recurrent, in partial remission: Secondary | ICD-10-CM

## 2020-04-12 DIAGNOSIS — N950 Postmenopausal bleeding: Secondary | ICD-10-CM | POA: Diagnosis not present

## 2020-04-12 DIAGNOSIS — Z23 Encounter for immunization: Secondary | ICD-10-CM | POA: Diagnosis not present

## 2020-04-12 DIAGNOSIS — Z13220 Encounter for screening for lipoid disorders: Secondary | ICD-10-CM | POA: Diagnosis not present

## 2020-04-12 MED ORDER — LORAZEPAM 0.5 MG PO TABS
0.2500 mg | ORAL_TABLET | Freq: Two times a day (BID) | ORAL | 1 refills | Status: DC | PRN
Start: 2020-04-12 — End: 2020-06-25

## 2020-04-12 MED ORDER — PAROXETINE HCL ER 25 MG PO TB24: 25.0000 mg | ORAL_TABLET | Freq: Every day | ORAL | 1 refills | Status: DC

## 2020-04-12 NOTE — Assessment & Plan Note (Signed)
In exacerbation. Will increase her paxil to 25mg  and add small dose of PRN lorazepam. Call with any concerns. Continue to monitor. Recheck 1 month.

## 2020-04-12 NOTE — Progress Notes (Signed)
BP 114/75   Pulse 62   Temp 98.1 F (36.7 C) (Oral)   Ht 5' (1.524 m)   Wt 225 lb 9.6 oz (102.3 kg)   LMP  (LMP Unknown)   SpO2 98%   BMI 44.06 kg/m    Subjective:    Patient ID: Maureen King, female    DOB: [birth year], 64 y.o.   MRN: [id number]  HPI: Maureen King is a 64 y.o. female  Chief Complaint  Patient presents with  . Vaginal Bleeding    for 2 days, was very light, patient states she has a lot of stress going on in life at this moment  . Stress    difficult time focusing on stuff   Had spotting x1. About 3-4 years ago, same thing happened. She has not had a period in years. She notes that it was enough to fill a panty liner. She has been under a huge amount of stress recently. She also notes that she has been having some night sweats. She is otherwise doing OK.  ANXIETY/STRESS- mom is in the hospital, lots of family stress, remodeling her house Duration: Few months Status:exacerbated Anxious mood: yes  Excessive worrying: yes Irritability: yes  Sweating: yes Nausea: no Palpitations:yes Hyperventilation: no Panic attacks: no Agoraphobia: no  Obscessions/compulsions: no Depressed mood: yes Depression screen [facility] 2/9 04/12/2020 11/28/2019 03/16/2019 03/11/2018 03/08/2017  Decreased Interest 0 1 1 1  0  Down, Depressed, Hopeless 1 1 1 1  0  PHQ - 2 Score 1 2 2 2  0  Altered sleeping 2 1 1 2  0  Tired, decreased energy 1 1 1 1  0  Change in appetite 0 1 1 2 1   Feeling bad or failure about yourself  0 0 0 1 0  Trouble concentrating 3 0 0 1 0  Moving slowly or fidgety/restless 0 0 0 0 0  Suicidal thoughts 0 0 0 0 0  PHQ-9 Score 7 5 5 9 1   Difficult doing work/chores Very difficult Somewhat difficult Somewhat difficult Very difficult -   Anhedonia: no Weight changes: no Insomnia: yes hard to fall asleep  Hypersomnia: no Fatigue/loss of energy: yes Feelings of worthlessness: no Feelings of guilt: no Impaired concentration/indecisiveness:  no Suicidal ideations: no  Crying spells: yes Recent Stressors/Life Changes: yes   Relationship problems: no   Family stress: yes     Financial stress: no    Job stress: yes    Recent death/loss: no   Relevant past medical, surgical, family and social history reviewed and updated as indicated. Interim medical history since our last visit reviewed. Allergies and medications reviewed and updated.  Review of Systems  Constitutional: Positive for diaphoresis. Negative for activity change, appetite change, chills, fatigue, fever and unexpected weight change.  HENT: Negative.   Respiratory: Negative.   Cardiovascular: Negative.   Gastrointestinal: Negative.   Genitourinary: Positive for vaginal bleeding. Negative for decreased urine volume, difficulty urinating, dyspareunia, dysuria, enuresis, flank pain, frequency, genital sores, hematuria, menstrual problem, pelvic pain, urgency, vaginal discharge and vaginal pain.  Psychiatric/Behavioral: Positive for dysphoric mood. Negative for agitation, behavioral problems, confusion, decreased concentration, hallucinations, self-injury, sleep disturbance and suicidal ideas. The patient is nervous/anxious. The patient is not hyperactive.     Per HPI unless specifically indicated above     Objective:    BP 114/75   Pulse 62   Temp 98.1 F (36.7 C) (Oral)   Ht 5' (1.524 m)   Wt 225 lb 9.6 oz (102.3 kg)  LMP  (LMP Unknown)   SpO2 98%   BMI 44.06 kg/m   Wt Readings from Last 3 Encounters:  04/12/20 225 lb 9.6 oz (102.3 kg)  11/28/19 229 lb 9.6 oz (104.1 kg)  03/16/19 230 lb (104.3 kg)    Physical Exam Vitals and nursing note reviewed.  Constitutional:      General: She is not in acute distress.    Appearance: Normal appearance. She is not ill-appearing, toxic-appearing or diaphoretic.  HENT:     Head: Normocephalic and atraumatic.     Right Ear: External ear normal.     Left Ear: External ear normal.     Nose: Nose normal.      Mouth/Throat:     Mouth: Mucous membranes are moist.     Pharynx: Oropharynx is clear.  Eyes:     General: No scleral icterus.       Right eye: No discharge.        Left eye: No discharge.     Extraocular Movements: Extraocular movements intact.     Conjunctiva/sclera: Conjunctivae normal.     Pupils: Pupils are equal, round, and reactive to light.  Cardiovascular:     Rate and Rhythm: Normal rate and regular rhythm.     Pulses: Normal pulses.     Heart sounds: Normal heart sounds. No murmur heard.  No friction rub. No gallop.   Pulmonary:     Effort: Pulmonary effort is normal. No respiratory distress.     Breath sounds: Normal breath sounds. No stridor. No wheezing, rhonchi or rales.  Chest:     Chest wall: No tenderness.  Musculoskeletal:        General: Normal range of motion.     Cervical back: Normal range of motion and neck supple.  Skin:    General: Skin is warm and dry.     Capillary Refill: Capillary refill takes less than 2 seconds.     Coloration: Skin is not jaundiced or pale.     Findings: No bruising, erythema, lesion or rash.  Neurological:     General: No focal deficit present.     Mental Status: She is alert and oriented to person, place, and time. Mental status is at baseline.  Psychiatric:        Mood and Affect: Mood normal.        Behavior: Behavior normal.        Thought Content: Thought content normal.        Judgment: Judgment normal.     Results for orders placed or performed in visit on 03/16/19  Microscopic Examination   URINE  Result Value Ref Range   WBC, UA 0-5 0 - 5 /hpf   RBC 11-30 (A) 0 - 2 /hpf   Epithelial Cells (non renal) 0-10 0 - 10 /hpf   Bacteria, UA Few (A) None seen/Few  Urine Culture, Reflex   URINE  Result Value Ref Range   Urine Culture, Routine Final report    Organism ID, Bacteria No growth   Comprehensive metabolic panel  Result Value Ref Range   Glucose 88 65 - 99 mg/dL   BUN 17 8 - 27 mg/dL   Creatinine, Ser  0.69 0.57 - 1.00 mg/dL   GFR calc non Af Amer 94 >59 mL/min/1.73   GFR calc Af Amer 108 >59 mL/min/1.73   BUN/Creatinine Ratio 25 12 - 28   Sodium 138 134 - 144 mmol/L   Potassium 4.4 3.5 - 5.2 mmol/L   Chloride  100 96 - 106 mmol/L   CO2 24 20 - 29 mmol/L   Calcium 10.1 8.7 - 10.3 mg/dL   Total Protein 6.2 6.0 - 8.5 g/dL   Albumin 4.4 3.8 - 4.8 g/dL   Globulin, Total 1.8 1.5 - 4.5 g/dL   Albumin/Globulin Ratio 2.4 (H) 1.2 - 2.2   Bilirubin Total 0.3 0.0 - 1.2 mg/dL   Alkaline Phosphatase 108 39 - 117 IU/L   AST 24 0 - 40 IU/L   ALT 21 0 - 32 IU/L  Lipid Panel w/o Chol/HDL Ratio  Result Value Ref Range   Cholesterol, Total 226 (H) 100 - 199 mg/dL   Triglycerides 221 (H) 0 - 149 mg/dL   HDL 77 >39 mg/dL   VLDL Cholesterol Cal 38 5 - 40 mg/dL   LDL Chol Calc (NIH) 111 (H) 0 - 99 mg/dL  TSH  Result Value Ref Range   TSH 1.560 0.450 - 4.500 uIU/mL  UA/M w/rflx Culture, Routine   Specimen: Urine   URINE  Result Value Ref Range   Specific Gravity, UA 1.015 1.005 - 1.030   pH, UA 6.5 5.0 - 7.5   Color, UA Yellow Yellow   Appearance Ur Clear Clear   Leukocytes,UA 1+ (A) Negative   Protein,UA Negative Negative/Trace   Glucose, UA Negative Negative   Ketones, UA Negative Negative   RBC, UA 2+ (A) Negative   Bilirubin, UA Negative Negative   Urobilinogen, Ur 0.2 0.2 - 1.0 mg/dL   Nitrite, UA Negative Negative   Microscopic Examination See below:    Urinalysis Reflex Comment   SAR CoV2 Serology (COVID 19)AB(IGG)IA  Result Value Ref Range   SARS-CoV-2 Ab, IgG Comment Negative  CBC with Differential/Platelet  Result Value Ref Range   WBC 6.5 3.4 - 10.8 x10E3/uL   RBC 4.80 3.77 - 5.28 x10E6/uL   Hemoglobin 14.5 11.1 - 15.9 g/dL   Hematocrit 42.5 34.0 - 46.6 %   MCV 89 79 - 97 fL   MCH 30.2 26.6 - 33.0 pg   MCHC 34.1 31 - 35 g/dL   RDW 12.7 11.7 - 15.4 %   Platelets 285 150 - 450 x10E3/uL   Neutrophils 69 Not Estab. %   Lymphs 21 Not Estab. %   Monocytes 6 Not Estab. %    Eos 2 Not Estab. %   Basos 1 Not Estab. %   Neutrophils Absolute 4.6 1.40 - 7.00 x10E3/uL   Lymphocytes Absolute 1.4 0 - 3 x10E3/uL   Monocytes Absolute 0.4 0 - 0 x10E3/uL   EOS (ABSOLUTE) 0.1 0.0 - 0.4 x10E3/uL   Basophils Absolute 0.0 0 - 0 x10E3/uL   Immature Granulocytes 1 Not Estab. %   Immature Grans (Abs) 0.0 0.0 - 0.1 x10E3/uL  Euroimmun SARS-CoV-2 Ab, IgG  Result Value Ref Range   Euroimmun SARS-CoV-2 Ab, IgG Negative Negative  Cytology - PAP  Result Value Ref Range   High risk HPV Positive (A)    HPV 16 Negative    HPV 18 / 45 Negative    Adequacy      Satisfactory for evaluation; transformation zone component PRESENT.   Diagnosis      - Negative for intraepithelial lesion or malignancy (NILM)   Comment Normal Reference Range HPV - Negative    Comment Normal Reference Range HPV 16 18 45 -Negative    Comment Normal Reference Range HPV 16- Negative       Assessment & Plan:   Problem List Items Addressed This Visit  Other   Depression    In exacerbation. Will increase her paxil to 25mg  and add small dose of PRN lorazepam. Call with any concerns. Continue to monitor. Recheck 1 month.       Relevant Medications   PARoxetine (PAXIL CR) 25 MG 24 hr tablet   LORazepam (ATIVAN) 0.5 MG tablet   Other Relevant Orders   Comprehensive metabolic panel   TSH    Other Visit Diagnoses    Postmenopausal bleeding    -  Primary   Will check labs and get pelvic US. Await results. Consider referral to GYN for endometrial bx. Call with any concerns.    Relevant Orders   US Pelvic Complete With Transvaginal   CBC with Differential/Platelet   Comprehensive metabolic panel   LH   Estradiol   FSH   Screening for cholesterol level       Labs drawn today. Await results.    Relevant Orders   Lipid Panel w/o Chol/HDL Ratio   Need for influenza vaccination       Flu shot given today.   Relevant Orders   Flu Vaccine QUAD 6+ mos PF IM (Fluarix Quad PF)       Follow up  plan: Return As scheduled about a month.

## 2020-04-13 LAB — CBC WITH DIFFERENTIAL/PLATELET
Basophils Absolute: 0 10*3/uL (ref 0.0–0.2)
Basos: 1 %
EOS (ABSOLUTE): 0.2 10*3/uL (ref 0.0–0.4)
Eos: 3 %
Hematocrit: 42 % (ref 34.0–46.6)
Hemoglobin: 14.2 g/dL (ref 11.1–15.9)
Immature Grans (Abs): 0 10*3/uL (ref 0.0–0.1)
Immature Granulocytes: 1 %
Lymphocytes Absolute: 1.2 10*3/uL (ref 0.7–3.1)
Lymphs: 21 %
MCH: 30.5 pg (ref 26.6–33.0)
MCHC: 33.8 g/dL (ref 31.5–35.7)
MCV: 90 fL (ref 79–97)
Monocytes Absolute: 0.4 10*3/uL (ref 0.1–0.9)
Monocytes: 7 %
Neutrophils Absolute: 4.1 10*3/uL (ref 1.4–7.0)
Neutrophils: 67 %
Platelets: 277 10*3/uL (ref 150–450)
RBC: 4.66 x10E6/uL (ref 3.77–5.28)
RDW: 12.7 % (ref 11.7–15.4)
WBC: 5.9 10*3/uL (ref 3.4–10.8)

## 2020-04-13 LAB — LIPID PANEL W/O CHOL/HDL RATIO
Cholesterol, Total: 211 mg/dL — ABNORMAL HIGH (ref 100–199)
HDL: 86 mg/dL (ref >39)
LDL Chol Calc (NIH): 102 mg/dL — ABNORMAL HIGH (ref 0–99)
Triglycerides: 136 mg/dL (ref 0–149)
VLDL Cholesterol Cal: 23 mg/dL (ref 5–40)

## 2020-04-13 LAB — COMPREHENSIVE METABOLIC PANEL
ALT: 17 IU/L (ref 0–32)
AST: 19 IU/L (ref 0–40)
Albumin/Globulin Ratio: 2.1 (ref 1.2–2.2)
Albumin: 4.2 g/dL (ref 3.8–4.8)
Alkaline Phosphatase: 98 IU/L (ref 44–121)
BUN/Creatinine Ratio: 20 (ref 12–28)
BUN: 14 mg/dL (ref 8–27)
Bilirubin Total: 0.4 mg/dL (ref 0.0–1.2)
CO2: 23 mmol/L (ref 20–29)
Calcium: 9.4 mg/dL (ref 8.7–10.3)
Chloride: 103 mmol/L (ref 96–106)
Creatinine, Ser: 0.7 mg/dL (ref 0.57–1.00)
GFR calc Af Amer: 107 mL/min/{1.73_m2} (ref >59)
GFR calc non Af Amer: 93 mL/min/{1.73_m2} (ref >59)
Globulin, Total: 2 g/dL (ref 1.5–4.5)
Glucose: 95 mg/dL (ref 65–99)
Potassium: 4.4 mmol/L (ref 3.5–5.2)
Sodium: 142 mmol/L (ref 134–144)
Total Protein: 6.2 g/dL (ref 6.0–8.5)

## 2020-04-13 LAB — FOLLICLE STIMULATING HORMONE: FSH: 61.4 m[IU]/mL

## 2020-04-13 LAB — TSH: TSH: 0.83 u[IU]/mL (ref 0.450–4.500)

## 2020-04-13 LAB — LUTEINIZING HORMONE: LH: 38.5 m[IU]/mL

## 2020-04-13 LAB — ESTRADIOL: Estradiol: <5 pg/mL

## 2020-04-15 ENCOUNTER — Other Ambulatory Visit: Payer: Self-pay | Admitting: Family Medicine

## 2020-04-15 DIAGNOSIS — Z1231 Encounter for screening mammogram for malignant neoplasm of breast: Secondary | ICD-10-CM

## 2020-04-16 ENCOUNTER — Other Ambulatory Visit: Payer: Self-pay

## 2020-04-16 ENCOUNTER — Ambulatory Visit
Admission: RE | Admit: 2020-04-16 | Discharge: 2020-04-16 | Disposition: A | Discharge status: Home / Self Care | Payer: Managed Care, Other (non HMO) | Source: Ambulatory Visit | Attending: Family Medicine | Admitting: Family Medicine

## 2020-04-16 DIAGNOSIS — N950 Postmenopausal bleeding: Secondary | ICD-10-CM | POA: Insufficient documentation

## 2020-04-17 ENCOUNTER — Other Ambulatory Visit: Payer: Self-pay | Admitting: Family Medicine

## 2020-04-17 DIAGNOSIS — N950 Postmenopausal bleeding: Secondary | ICD-10-CM

## 2020-04-18 ENCOUNTER — Other Ambulatory Visit: Payer: Self-pay | Admitting: Family Medicine

## 2020-04-23 ENCOUNTER — Ambulatory Visit
Admission: RE | Admit: 2020-04-23 | Discharge: 2020-04-23 | Disposition: A | Discharge status: Home / Self Care | Payer: 59 | Source: Ambulatory Visit | Attending: Family Medicine | Admitting: Family Medicine

## 2020-04-23 DIAGNOSIS — Z1231 Encounter for screening mammogram for malignant neoplasm of breast: Secondary | ICD-10-CM | POA: Diagnosis not present

## 2020-05-04 ENCOUNTER — Other Ambulatory Visit: Payer: Self-pay | Admitting: Family Medicine

## 2020-05-15 ENCOUNTER — Other Ambulatory Visit (HOSPITAL_COMMUNITY)
Admission: RE | Admit: 2020-05-15 | Discharge: 2020-05-15 | Disposition: A | Discharge status: Home / Self Care | Payer: Managed Care, Other (non HMO) | Source: Ambulatory Visit | Attending: Obstetrics and Gynecology | Admitting: Obstetrics and Gynecology

## 2020-05-15 ENCOUNTER — Ambulatory Visit (INDEPENDENT_AMBULATORY_CARE_PROVIDER_SITE_OTHER): Payer: Managed Care, Other (non HMO) | Admitting: Obstetrics and Gynecology

## 2020-05-15 ENCOUNTER — Other Ambulatory Visit: Payer: Self-pay

## 2020-05-15 ENCOUNTER — Encounter: Payer: Self-pay | Admitting: Obstetrics and Gynecology

## 2020-05-15 VITALS — BP 122/70 | Ht 60.0 in | Wt 225.2 lb

## 2020-05-15 DIAGNOSIS — Z124 Encounter for screening for malignant neoplasm of cervix: Secondary | ICD-10-CM | POA: Diagnosis present

## 2020-05-15 DIAGNOSIS — N760 Acute vaginitis: Secondary | ICD-10-CM

## 2020-05-15 DIAGNOSIS — N950 Postmenopausal bleeding: Secondary | ICD-10-CM | POA: Diagnosis not present

## 2020-05-15 NOTE — H&P (View-Only) (Signed)
Patient ID: Maureen King, female   DOB: 04/18/1956, 64 y.o.   MRN: [id number]  Reason for Consult: Gynecologic Exam   Referred by Valerie Roys, DO  Subjective:     HPI:  Maureen King is a 64 y.o. female she reports that 6 weeks ago she has 1.5 days of vaginal bleeding. The bleeding was small in amount, just managed by a panty liner.  She was seen by PCP and US showed endometrium was thickened to 6.8 mm.  She reports that she was under a lot of family stress at that time as her mother and son in law were both hospitalized.   Gynecological History Menarche: 13 Menopause: uncertain Last pap smear: 03/2019- NIL HPV +  Last Mammogram: 20201- BIRADS 1 Sexually Active: yes  Obstetrical History G0P)  Past Medical History:  Diagnosis Date  . Anxiety   . Depression   . GERD (gastroesophageal reflux disease)   . Insomnia   . Obesity    Family History  Problem Relation Age of Onset  . Hypertension Mother   . Osteoporosis Mother   . Heart disease Father 40       Stent  . Lung disease Father   . Idiopathic pulmonary fibrosis Father   . Hypertension Brother   . Cancer Maternal Grandmother        gallbladder  . Heart disease Paternal Grandfather        MI  . Breast cancer Neg Hx    Past Surgical History:  Procedure Laterality Date  . NO PAST SURGERIES      Short Social History:  Social History   Tobacco Use  . Smoking status: Never Smoker  . Smokeless tobacco: Never Used  Substance Use Topics  . Alcohol use: Yes    Alcohol/week: 0.0 standard drinks    Comment: on occasion (4x/year)    Allergies  Allergen Reactions  . Penicillins Rash  . Neosporin [Neomycin-Polymyxin-Gramicidin]     Current Outpatient Medications  Medication Sig Dispense Refill  . aspirin 81 MG tablet Take 81 mg by mouth daily.    . Calcium Carbonate-Vit D-Min (CALCIUM 600 + MINERALS PO) Take 600 mg by mouth daily.    . cholecalciferol (VITAMIN D) 1000 UNITS tablet Take  2,000 Units by mouth daily.     Marland Kitchen LORazepam (ATIVAN) 0.5 MG tablet Take 0.5-1 tablets (0.25-0.5 mg total) by mouth 2 (two) times daily as needed for anxiety. 20 tablet 1  . Magnesium 250 MG TABS Take 250 mg by mouth daily.    . Olopatadine HCl 0.2 % SOLN Apply 1 drop to eye 2 (two) times a day. 2.5 mL 12  . PARoxetine (PAXIL CR) 25 MG 24 hr tablet Take 1 tablet (25 mg total) by mouth daily. 90 tablet 1  . pyridOXINE (VITAMIN B-6) 25 MG tablet Take 25 mg by mouth daily.    . vitamin B-12 (CYANOCOBALAMIN) 250 MCG tablet Take 250 mcg by mouth daily.     No current facility-administered medications for this visit.    Review of Systems  Constitutional: Negative for chills, fatigue, fever and unexpected weight change.  HENT: Negative for trouble swallowing.  Eyes: Negative for loss of vision.  Respiratory: Negative for cough, shortness of breath and wheezing.  Cardiovascular: Negative for chest pain, leg swelling, palpitations and syncope.  GI: Negative for abdominal pain, blood in stool, diarrhea, nausea and vomiting.  GU: Negative for difficulty urinating, dysuria, frequency and hematuria.  Musculoskeletal: Negative for back pain, leg  pain and joint pain.  Skin: Negative for rash.  Neurological: Negative for dizziness, headaches, light-headedness, numbness and seizures.  Psychiatric: Negative for behavioral problem, confusion, depressed mood and sleep disturbance.        Objective:  Objective   Vitals:   05/15/20 0811  BP: 122/70  Weight: 225 lb 3.2 oz (102.2 kg)  Height: 5' (1.524 m)   Body mass index is 43.98 kg/m.  Physical Exam Vitals and nursing note reviewed.  Constitutional:      Appearance: She is well-developed and well-nourished.  HENT:     Head: Normocephalic and atraumatic.  Eyes:     Extraocular Movements: EOM normal.     Pupils: Pupils are equal, round, and reactive to light.  Cardiovascular:     Rate and Rhythm: Normal rate and regular rhythm.  Pulmonary:      Effort: Pulmonary effort is normal. No respiratory distress.     Breath sounds: Normal breath sounds.  Abdominal:     General: Bowel sounds are normal.     Palpations: Abdomen is soft.  Genitourinary:    Comments: External: Normal appearing vulva. No lesions noted.  Speculum examination: Normal appearing cervix, stenotic cervical os, small atrophic cervix. No blood in the vaginal vault. White clumpy discharge.   Bimanual examination: Uterus midline, non-tender, normal in size, shape and contour.  No CMT. No adnexal masses. No adnexal tenderness. Pelvis not fixed.    Musculoskeletal:        General: Normal range of motion.  Skin:    General: Skin is warm and dry.  Neurological:     Mental Status: She is alert and oriented to person, place, and time.  Psychiatric:        Mood and Affect: Mood and affect normal.        Behavior: Behavior normal.        Thought Content: Thought content normal.        Judgment: Judgment normal.    Endometrial Biopsy- not preformed After discussion with the patient regarding her abnormal uterine bleeding I recommended that she proceed with an endometrial biopsy for further diagnosis. The risks, benefits, alternatives, and indications for an endometrial biopsy were discussed with the patient in detail. She understood the risks including infection, bleeding, cervical laceration and uterine perforation.  Verbal consent was obtained.   PROCEDURE NOTE:  Cervix was stenotic and atrophic, endometrial biopsy not able to be easily performed in office so decision made to take patient for hysteroscopy D&C  Assessment/Plan:     64 yo with postmenopausal bleeding 1. Will plan for hysteroscopy D&C. Note sent to scheduler 2. Pap smear updated today 3. Acute vaginitis- nuswab sent to check for BV and yeast.  More than 45 minutes were spent face to face with the patient in the room, reviewing the medical record, labs and images, and coordinating care for the  patient. The plan of management was discussed in detail and counseling was provided.      Adrian Prows MD [facility], [facility] 05/15/2020 8:36 AM

## 2020-05-15 NOTE — Progress Notes (Signed)
Patient ID: Maureen King, female   DOB: 11-14-1955, 64 y.o.   MRN: [id number]  Reason for Consult: Gynecologic Exam   Referred by Valerie Roys, DO  Subjective:     HPI:  Maureen King is a 64 y.o. female she reports that 6 weeks ago she has 1.5 days of vaginal bleeding. The bleeding was small in amount, just managed by a panty liner.  She was seen by PCP and US showed endometrium was thickened to 6.8 mm.  She reports that she was under a lot of family stress at that time as her mother and son in law were both hospitalized.   Gynecological History Menarche: 13 Menopause: uncertain Last pap smear: 03/2019- NIL HPV +  Last Mammogram: 20201- BIRADS 1 Sexually Active: yes  Obstetrical History G0P)  Past Medical History:  Diagnosis Date  . Anxiety   . Depression   . GERD (gastroesophageal reflux disease)   . Insomnia   . Obesity    Family History  Problem Relation Age of Onset  . Hypertension Mother   . Osteoporosis Mother   . Heart disease Father 78       Stent  . Lung disease Father   . Idiopathic pulmonary fibrosis Father   . Hypertension Brother   . Cancer Maternal Grandmother        gallbladder  . Heart disease Paternal Grandfather        MI  . Breast cancer Neg Hx    Past Surgical History:  Procedure Laterality Date  . NO PAST SURGERIES      Short Social History:  Social History   Tobacco Use  . Smoking status: Never Smoker  . Smokeless tobacco: Never Used  Substance Use Topics  . Alcohol use: Yes    Alcohol/week: 0.0 standard drinks    Comment: on occasion (4x/year)    Allergies  Allergen Reactions  . Penicillins Rash  . Neosporin [Neomycin-Polymyxin-Gramicidin]     Current Outpatient Medications  Medication Sig Dispense Refill  . aspirin 81 MG tablet Take 81 mg by mouth daily.    . Calcium Carbonate-Vit D-Min (CALCIUM 600 + MINERALS PO) Take 600 mg by mouth daily.    . cholecalciferol (VITAMIN D) 1000 UNITS tablet Take  2,000 Units by mouth daily.     Marland Kitchen LORazepam (ATIVAN) 0.5 MG tablet Take 0.5-1 tablets (0.25-0.5 mg total) by mouth 2 (two) times daily as needed for anxiety. 20 tablet 1  . Magnesium 250 MG TABS Take 250 mg by mouth daily.    . Olopatadine HCl 0.2 % SOLN Apply 1 drop to eye 2 (two) times a day. 2.5 mL 12  . PARoxetine (PAXIL CR) 25 MG 24 hr tablet Take 1 tablet (25 mg total) by mouth daily. 90 tablet 1  . pyridOXINE (VITAMIN B-6) 25 MG tablet Take 25 mg by mouth daily.    . vitamin B-12 (CYANOCOBALAMIN) 250 MCG tablet Take 250 mcg by mouth daily.     No current facility-administered medications for this visit.    Review of Systems  Constitutional: Negative for chills, fatigue, fever and unexpected weight change.  HENT: Negative for trouble swallowing.  Eyes: Negative for loss of vision.  Respiratory: Negative for cough, shortness of breath and wheezing.  Cardiovascular: Negative for chest pain, leg swelling, palpitations and syncope.  GI: Negative for abdominal pain, blood in stool, diarrhea, nausea and vomiting.  GU: Negative for difficulty urinating, dysuria, frequency and hematuria.  Musculoskeletal: Negative for back pain, leg  pain and joint pain.  Skin: Negative for rash.  Neurological: Negative for dizziness, headaches, light-headedness, numbness and seizures.  Psychiatric: Negative for behavioral problem, confusion, depressed mood and sleep disturbance.        Objective:  Objective   Vitals:   05/15/20 0811  BP: 122/70  Weight: 225 lb 3.2 oz (102.2 kg)  Height: 5' (1.524 m)   Body mass index is 43.98 kg/m.  Physical Exam Vitals and nursing note reviewed.  Constitutional:      Appearance: She is well-developed and well-nourished.  HENT:     Head: Normocephalic and atraumatic.  Eyes:     Extraocular Movements: EOM normal.     Pupils: Pupils are equal, round, and reactive to light.  Cardiovascular:     Rate and Rhythm: Normal rate and regular rhythm.  Pulmonary:      Effort: Pulmonary effort is normal. No respiratory distress.     Breath sounds: Normal breath sounds.  Abdominal:     General: Bowel sounds are normal.     Palpations: Abdomen is soft.  Genitourinary:    Comments: External: Normal appearing vulva. No lesions noted.  Speculum examination: Normal appearing cervix, stenotic cervical os, small atrophic cervix. No blood in the vaginal vault. White clumpy discharge.   Bimanual examination: Uterus midline, non-tender, normal in size, shape and contour.  No CMT. No adnexal masses. No adnexal tenderness. Pelvis not fixed.    Musculoskeletal:        General: Normal range of motion.  Skin:    General: Skin is warm and dry.  Neurological:     Mental Status: She is alert and oriented to person, place, and time.  Psychiatric:        Mood and Affect: Mood and affect normal.        Behavior: Behavior normal.        Thought Content: Thought content normal.        Judgment: Judgment normal.    Endometrial Biopsy- not preformed After discussion with the patient regarding her abnormal uterine bleeding I recommended that she proceed with an endometrial biopsy for further diagnosis. The risks, benefits, alternatives, and indications for an endometrial biopsy were discussed with the patient in detail. She understood the risks including infection, bleeding, cervical laceration and uterine perforation.  Verbal consent was obtained.   PROCEDURE NOTE:  Cervix was stenotic and atrophic, endometrial biopsy not able to be easily performed in office so decision made to take patient for hysteroscopy D&C  Assessment/Plan:     64 yo with postmenopausal bleeding 1. Will plan for hysteroscopy D&C. Note sent to scheduler 2. Pap smear updated today 3. Acute vaginitis- nuswab sent to check for BV and yeast.  More than 45 minutes were spent face to face with the patient in the room, reviewing the medical record, labs and images, and coordinating care for the  patient. The plan of management was discussed in detail and counseling was provided.      Adrian Prows MD [facility], [facility] 05/15/2020 8:36 AM

## 2020-05-15 NOTE — Progress Notes (Signed)
[facility] referring for Post-menopausal bleeding, thickened endometrial stripe. Pt states she had an US done at the hospital recently .

## 2020-05-15 NOTE — Patient Instructions (Signed)
Hysteroscopy Hysteroscopy is a procedure that is used to examine the inside of a woman's womb (uterus). This may be done for various reasons, including:  To look for lumps (tumors) and other growths in the uterus.  To evaluate abnormal bleeding, fibroid tumors, polyps, scar tissue (adhesions), or cancer of the uterus.  To determine the cause of an inability to get pregnant (infertility) or repeated losses of pregnancies (miscarriages).  To find a lost IUD (intrauterine device).  To perform a procedure that permanently prevents pregnancy (sterilization). During this procedure, a thin, flexible tube with a small light and camera (hysteroscope) is used to examine the uterus. The camera sends images to a monitor in the room so that your health care provider can view the inside of your uterus. A hysteroscopy should be done right after a menstrual period to make sure that you are not pregnant. Tell a health care provider about:  Any allergies you have.  All medicines you are taking, including vitamins, herbs, eye drops, creams, and over-the-counter medicines.  Any problems you or family members have had with the use of anesthetic medicines.  Any blood disorders you have.  Any surgeries you have had.  Any medical conditions you have.  Whether you are pregnant or may be pregnant. What are the risks? Generally, this is a safe procedure. However, problems may occur, including:  Excessive bleeding.  Infection.  Damage to the uterus or other structures or organs.  Allergic reaction to medicines or fluids that are used in the procedure. What happens before the procedure? Staying hydrated Follow instructions from your health care provider about hydration, which may include:  Up to 2 hours before the procedure - you may continue to drink clear liquids, such as water, clear fruit juice, black coffee, and plain tea. Eating and drinking restrictions Follow instructions from your health care  provider about eating and drinking, which may include:  8 hours before the procedure - stop eating solid foods and drink clear liquids only  2 hours before the procedure - stop drinking clear liquids. General instructions  Ask your health care provider about: ? Changing or stopping your normal medicines. This is important if you take diabetes medicines or blood thinners. ? Taking medicines such as aspirin and ibuprofen. These medicines can thin your blood and cause bleeding. Do not take these medicines for 1 week before your procedure, or as told by your health care provider.  Do not use any products that contain nicotine or tobacco for 2 weeks before the procedure. This includes cigarettes and e-cigarettes. If you need help quitting, ask your health care provider.  Medicine may be placed in your cervix the day before the procedure. This medicine causes the cervix to have a larger opening (dilate). The larger opening makes it easier for the hysteroscope to be inserted into the uterus during the procedure.  Plan to have someone with you for the first 24-48 hours after the procedure, especially if you are given a medicine to make you fall asleep (general anesthetic).  Plan to have someone take you home from the hospital or clinic. What happens during the procedure?  To lower your risk of infection: ? Your health care team will wash or sanitize their hands. ? Your skin will be washed with soap. ? Hair may be removed from the surgical area.  An IV tube will be inserted into one of your veins.  You may be given one or more of the following: ? A medicine to help  you relax (sedative). ? A medicine that numbs the area around the cervix (local anesthetic). ? A medicine to make you fall asleep (general anesthetic).  A hysteroscope will be inserted through your vagina and into your uterus.  Air or fluid will be used to enlarge your uterus, enabling your health care provider to see your uterus  better. The amount of fluid used will be carefully checked throughout the procedure.  In some cases, tissue may be gently scraped from inside the uterus and sent to a lab for testing (biopsy). The procedure may vary among health care providers and hospitals. What happens after the procedure?  Your blood pressure, heart rate, breathing rate, and blood oxygen level will be monitored until the medicines you were given have worn off.  You may have some cramping. You may be given medicines for this.  You may have bleeding, which varies from light spotting to menstrual-like bleeding. This is normal.  If you had a biopsy done, it is your responsibility to get the results of your procedure. Ask your health care provider, or the department performing the procedure, when your results will be ready. Summary  Hysteroscopy is a procedure that is used to examine the inside of a woman's womb (uterus).  After the procedure, you may have bleeding, which varies from light spotting to menstrual-like bleeding. This is normal. You may also have cramping.  Plan to have someone take you home from the hospital or clinic. This information is not intended to replace advice given to you by your health care provider. Make sure you discuss any questions you have with your health care provider. Document Revised: 04/30/2017 Document Reviewed: 06/16/2016 Elsevier Patient Education  [employer]. Postmenopausal Bleeding  Postmenopausal bleeding is any bleeding that a woman has after she has entered into menopause. Menopause is the end of a woman's fertile years. After menopause, a woman no longer ovulates and does not have menstrual periods. Postmenopausal bleeding may have various causes, including:  Menopausal hormone therapy (MHT).  Endometrial atrophy. After menopause, low estrogen hormone levels cause the membrane that lines the uterus (endometrium) to become thinner. You may have bleeding as the endometrium  thins.  Endometrial hyperplasia. This condition is caused by excess estrogen hormones and low levels of progesterone hormones. The excess estrogen causes the endometrium to thicken, which can lead to bleeding. In some cases, this can lead to cancer of the uterus.  Endometrial cancer.  Non-cancerous growths (polyps) on the endometrium, the lining of the uterus, or the cervix.  Uterine fibroids. These are non-cancerous growths in or around the uterus muscle tissue that can cause heavy bleeding. Any type of postmenopausal bleeding, even if it appears to be a typical menstrual period, should be evaluated by your health care provider. Treatment will depend on the cause of the bleeding. Follow these instructions at home:  Pay attention to any changes in your symptoms.  Avoid using tampons and douches as told by your health care provider.  Change your pads regularly.  Get regular pelvic exams and Pap tests.  Take iron supplements as told by your health care provider.  Take over-the-counter and prescription medicines only as told by your health care provider.  Keep all follow-up visits as told by your health care provider. This is important. Contact a health care provider if:  Your bleeding lasts more than 1 week.  You have abdominal pain.  You have bleeding with or after sexual intercourse.  You have bleeding that happens more often  than every 3 weeks. Get help right away if:  You have a fever, chills, headache, dizziness, muscle aches, and bleeding.  You have severe pain with bleeding.  You are passing blood clots.  You have heavy bleeding, need more than 1 pad an hour, and have never experienced this before.  You feel faint. Summary  Postmenopausal bleeding is any bleeding that a woman has after she has entered into menopause.  Postmenopausal bleeding may have various causes. Treatment will depend on the cause of the bleeding.  Any type of postmenopausal bleeding, even  if it appears to be a typical menstrual period, should be evaluated by your health care provider.  Be sure to pay attention to any changes in your symptoms and keep all follow-up visits as told by your health care provider. This information is not intended to replace advice given to you by your health care provider. Make sure you discuss any questions you have with your health care provider. Document Revised: 08/25/2017 Document Reviewed: 08/11/2016 Elsevier Patient Education  [employer].

## 2020-05-17 ENCOUNTER — Telehealth: Payer: Self-pay

## 2020-05-17 NOTE — Telephone Encounter (Signed)
[facility] w/[facility] calling. They received a pap on this patient. The order shows cancelled in [place]. They have already processed. Advised it was cancelled and ordered thru [place]. Pt is [employer]. Stanton Kidney states they will work with [employer] on the billing of this since it's already been processed.

## 2020-05-19 LAB — NUSWAB BV AND CANDIDA, NAA
Candida albicans, NAA: NEGATIVE
Candida glabrata, NAA: NEGATIVE

## 2020-05-21 LAB — CYTOLOGY - PAP
Comment: NEGATIVE
Comment: NEGATIVE
Diagnosis: NEGATIVE
HPV 16: NEGATIVE
HPV 18 / 45: NEGATIVE
High risk HPV: POSITIVE — AB

## 2020-05-21 NOTE — Progress Notes (Signed)
Advised need for colposcopy. Note sent to surgical scheduler to add on colposcopy to hysteroscopy D&C.

## 2020-05-22 ENCOUNTER — Telehealth: Payer: Self-pay | Admitting: Obstetrics and Gynecology

## 2020-05-22 NOTE — Telephone Encounter (Signed)
-----   Message from Homero Fellers, MD sent at 05/15/2020  8:47 AM EST ----- Surgery Booking Request Patient Full Name:  BOBETTA KORF  MRN: [id number]  DOB: December 02, 1955  Surgeon: Homero Fellers, MD  Requested Surgery Date and Time: would like before 2022 Primary Diagnosis AND Code: postmenopausal bleeding Secondary Diagnosis and Code:  Surgical Procedure: Hysteroscopy D&C RNFA Requested?: No L&D Notification: No Admission Status: same day surgery Length of Surgery: 50 min Special Case Needs: Yes- microdilators H&P: No Phone Interview???:  Yes Interpreter: No Medical Clearance:  No Special Scheduling Instructions: No Any known health/anesthesia issues, diabetes, sleep apnea, latex allergy, defibrillator/pacemaker?: No Acuity: P3   (P1 highest, P2 delay may cause harm, P3 low, elective gyn, P4 lowest)

## 2020-05-22 NOTE — Telephone Encounter (Signed)
Patient called to schedule Hysteroscopy D&C and colpo w [facility]  DOS 05/30/20  H&P n/a  Covid testing 12/28 @ 8-10:30, [address], drive up and wear mask. Advised pt to quarantine until DOS.  Pre-admit phone call appointment to be requested - date and time will be included on H&P paper work. Also all appointments will be updated on pt MyChart. Explained that this appointment has a call window. Based on the time scheduled will indicate if the call will be received within a 4 hour window before 1:00 or after.  Advised that pt may also receive calls from the hospital pharmacy and pre-service center.  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 No secondary insurance.

## 2020-05-27 ENCOUNTER — Encounter
Admission: RE | Admit: 2020-05-27 | Discharge: 2020-05-27 | Disposition: A | Discharge status: Home / Self Care | Payer: Managed Care, Other (non HMO) | Source: Ambulatory Visit | Attending: Obstetrics and Gynecology | Admitting: Obstetrics and Gynecology

## 2020-05-27 NOTE — Patient Instructions (Addendum)
Your procedure is scheduled on: 05/30/20- Thursday Report to the Registration Desk on the 1st floor of the [facility]. To find out your arrival time, please call [phone] between 1PM - 3PM on: 05/29/20- Wednesday  REMEMBER: Instructions that are not followed completely may result in serious medical risk, up to and including death; or upon the discretion of your surgeon and anesthesiologist your surgery may need to be rescheduled.  Do not eat food after midnight the night before surgery.  No gum chewing, lozengers or hard candies.  You may however, drink CLEAR liquids up to 2 hours before you are scheduled to arrive for your surgery. Do not drink anything within 2 hours of your scheduled arrival time.  Clear liquids include: - water  - apple juice without pulp - gatorade (not RED, PURPLE, OR BLUE) - black coffee or tea (Do NOT add milk or creamers to the coffee or tea) Do NOT drink anything that is not on this list.  In addition, your doctor has ordered for you to drink the provided  Ensure Pre-Surgery Clear Carbohydrate Drink  Drinking this carbohydrate drink up to two hours before surgery helps to reduce insulin resistance and improve patient outcomes. Please complete drinking 2 hours prior to scheduled arrival time.  TAKE THESE MEDICATIONS THE MORNING OF SURGERY WITH A SIP OF WATER: - PARoxetine (PAXIL-CR) 12.5 MG 24 hr tablet  Follow recommendations from Cardiologist, Pulmonologist or PCP regarding stopping Aspirin, Coumadin, Plavix, Eliquis, Pradaxa, or Pletal.  One week prior to surgery: Stop Anti-inflammatories (NSAIDS) such as Advil, Aleve, Ibuprofen, Motrin, Naproxen, Naprosyn and Aspirin based products such as Excedrin, Goodys Powder, BC Powder.  Stop ANY OVER THE COUNTER supplements 05/27/20 until after surgery.B Complex-C (B-COMPLEX WITH VITAMIN C) tablet, HOMEOPATHIC PRODUCTS PO , Magnesium 250 MG TABS, Calcium Carb-Cholecalciferol (CALTRATE 600+D3 PO), ELDERBERRY  ZINC/VIT C/IMMUNE MT) (However, you may continue taking Vitamin D, Vitamin B, and multivitamin up until the day before surgery.)  No Alcohol for 24 hours before or after surgery.  No Smoking including e-cigarettes for 24 hours prior to surgery.  No chewable tobacco products for at least 6 hours prior to surgery.  No nicotine patches on the day of surgery.  Do not use any "recreational" drugs for at least a week prior to your surgery.  Please be advised that the combination of cocaine and anesthesia may have negative outcomes, up to and including death. If you test positive for cocaine, your surgery will be cancelled.  On the morning of surgery brush your teeth with toothpaste and water, you may rinse your mouth with mouthwash if you wish. Do not swallow any toothpaste or mouthwash.  Do not wear jewelry, make-up, hairpins, clips or nail polish.  Do not wear lotions, powders, or perfumes.   Do not shave body from the neck down 48 hours prior to surgery just in case you cut yourself which could leave a site for infection.  Also, freshly shaved skin may become irritated if using the CHG soap.  Contact lenses, hearing aids and dentures may not be worn into surgery.  Do not bring valuables to the hospital. [facility] is not responsible for any missing/lost belongings or valuables.   Notify your doctor if there is any change in your medical condition (cold, fever, infection).  Wear comfortable clothing (specific to your surgery type) to the hospital.  Plan for stool softeners for home use; pain medications have a tendency to cause constipation. You can also help prevent constipation by eating  foods high in fiber such as fruits and vegetables and drinking plenty of fluids as your diet allows.  After surgery, you can help prevent lung complications by doing breathing exercises.  Take deep breaths and cough every 1-2 hours. Your doctor may order a device called an Incentive Spirometer to  help you take deep breaths. When coughing or sneezing, hold a pillow firmly against your incision with both hands. This is called splinting. Doing this helps protect your incision. It also decreases belly discomfort.  If you are being admitted to the hospital overnight, leave your suitcase in the car. After surgery it may be brought to your room.  If you are being discharged the day of surgery, you will not be allowed to drive home. You will need a responsible adult (18 years or older) to drive you home and stay with you that night.   If you are taking public transportation, you will need to have a responsible adult (18 years or older) with you. Please confirm with your physician that it is acceptable to use public transportation.   Please call the Pre-admissions Testing Dept. at [phone] if you have any questions about these instructions.  Visitation Policy:  Patients undergoing a surgery or procedure may have one family member or support person with them as long as that person is not COVID-19 positive or experiencing its symptoms.  That person may remain in the waiting area during the procedure.  Inpatient Visitation Update:   In an effort to ensure the safety of our team members and our patients, we are implementing a change to our visitation policy:  Effective Monday, Aug. 9, at 7 a.m., inpatients will be allowed one support person.  o The support person may change daily.  o The support person must pass our screening, gel in and out, and wear a mask at all times, including in the patients room.  o Patients must also wear a mask when staff or their support person are in the room.  o Masking is required regardless of vaccination status.  Systemwide, no visitors 17 or younger.

## 2020-05-28 ENCOUNTER — Encounter
Admission: RE | Admit: 2020-05-28 | Discharge: 2020-05-28 | Disposition: A | Discharge status: Home / Self Care | Payer: Managed Care, Other (non HMO) | Source: Ambulatory Visit | Attending: Obstetrics and Gynecology | Admitting: Obstetrics and Gynecology

## 2020-05-28 ENCOUNTER — Other Ambulatory Visit: Payer: Self-pay

## 2020-05-28 DIAGNOSIS — Z01812 Encounter for preprocedural laboratory examination: Secondary | ICD-10-CM | POA: Diagnosis not present

## 2020-05-28 DIAGNOSIS — Z20822 Contact with and (suspected) exposure to covid-19: Secondary | ICD-10-CM | POA: Insufficient documentation

## 2020-05-28 LAB — TYPE AND SCREEN
ABO/RH(D): O POS
Antibody Screen: NEGATIVE

## 2020-05-28 LAB — CBC
HCT: 40.5 % (ref 36.0–46.0)
Hemoglobin: 13.8 g/dL (ref 12.0–15.0)
MCH: 30.1 pg (ref 26.0–34.0)
MCHC: 34.1 g/dL (ref 30.0–36.0)
MCV: 88.2 fL (ref 80.0–100.0)
Platelets: 297 10*3/uL (ref 150–400)
RBC: 4.59 MIL/uL (ref 3.87–5.11)
RDW: 13 % (ref 11.5–15.5)
WBC: 8.1 10*3/uL (ref 4.0–10.5)
nRBC: 0 % (ref 0.0–0.2)

## 2020-05-28 LAB — BASIC METABOLIC PANEL
Anion gap: 8 (ref 5–15)
BUN: 18 mg/dL (ref 8–23)
CO2: 24 mmol/L (ref 22–32)
Calcium: 9.1 mg/dL (ref 8.9–10.3)
Chloride: 107 mmol/L (ref 98–111)
Creatinine, Ser: 0.52 mg/dL (ref 0.44–1.00)
GFR, Estimated: >60 mL/min (ref >60)
Glucose, Bld: 91 mg/dL (ref 70–99)
Potassium: 4.1 mmol/L (ref 3.5–5.1)
Sodium: 139 mmol/L (ref 135–145)

## 2020-05-28 LAB — SARS CORONAVIRUS 2 (TAT 6-24 HRS): SARS Coronavirus 2: NEGATIVE

## 2020-05-30 ENCOUNTER — Ambulatory Visit: Payer: Managed Care, Other (non HMO) | Admitting: Anesthesiology

## 2020-05-30 ENCOUNTER — Encounter: Payer: Self-pay | Admitting: Obstetrics and Gynecology

## 2020-05-30 ENCOUNTER — Other Ambulatory Visit: Payer: Self-pay

## 2020-05-30 ENCOUNTER — Encounter: Payer: Managed Care, Other (non HMO) | Admitting: Family Medicine

## 2020-05-30 ENCOUNTER — Ambulatory Visit
Admission: RE | Admit: 2020-05-30 | Discharge: 2020-05-30 | Disposition: A | Discharge status: Home / Self Care | Payer: Managed Care, Other (non HMO) | Attending: Obstetrics and Gynecology | Admitting: Obstetrics and Gynecology

## 2020-05-30 ENCOUNTER — Encounter
Admission: RE | Disposition: A | Discharge status: Home / Self Care | Payer: Self-pay | Source: Home / Self Care | Attending: Obstetrics and Gynecology

## 2020-05-30 DIAGNOSIS — Z880 Allergy status to penicillin: Secondary | ICD-10-CM | POA: Insufficient documentation

## 2020-05-30 DIAGNOSIS — R87810 Cervical high risk human papillomavirus (HPV) DNA test positive: Secondary | ICD-10-CM

## 2020-05-30 DIAGNOSIS — Z836 Family history of other diseases of the respiratory system: Secondary | ICD-10-CM | POA: Insufficient documentation

## 2020-05-30 DIAGNOSIS — Z8262 Family history of osteoporosis: Secondary | ICD-10-CM | POA: Diagnosis not present

## 2020-05-30 DIAGNOSIS — Z7982 Long term (current) use of aspirin: Secondary | ICD-10-CM | POA: Insufficient documentation

## 2020-05-30 DIAGNOSIS — N760 Acute vaginitis: Secondary | ICD-10-CM | POA: Insufficient documentation

## 2020-05-30 DIAGNOSIS — N950 Postmenopausal bleeding: Secondary | ICD-10-CM | POA: Diagnosis present

## 2020-05-30 DIAGNOSIS — Z79899 Other long term (current) drug therapy: Secondary | ICD-10-CM | POA: Insufficient documentation

## 2020-05-30 DIAGNOSIS — Z8249 Family history of ischemic heart disease and other diseases of the circulatory system: Secondary | ICD-10-CM | POA: Diagnosis not present

## 2020-05-30 DIAGNOSIS — R87610 Atypical squamous cells of undetermined significance on cytologic smear of cervix (ASC-US): Secondary | ICD-10-CM

## 2020-05-30 DIAGNOSIS — Z800 Family history of malignant neoplasm of digestive organs: Secondary | ICD-10-CM | POA: Insufficient documentation

## 2020-05-30 DIAGNOSIS — Z881 Allergy status to other antibiotic agents status: Secondary | ICD-10-CM | POA: Insufficient documentation

## 2020-05-30 DIAGNOSIS — N840 Polyp of corpus uteri: Secondary | ICD-10-CM | POA: Diagnosis not present

## 2020-05-30 HISTORY — PX: COLPOSCOPY: SHX161

## 2020-05-30 HISTORY — PX: HYSTEROSCOPY WITH D & C: SHX1775

## 2020-05-30 LAB — ABO/RH: ABO/RH(D): O POS

## 2020-05-30 SURGERY — DILATATION AND CURETTAGE /HYSTEROSCOPY
Anesthesia: General

## 2020-05-30 MED ORDER — FENTANYL CITRATE (PF) 100 MCG/2ML IJ SOLN
INTRAMUSCULAR | Status: AC
  Filled 2020-05-30: qty 2

## 2020-05-30 MED ORDER — SUCCINYLCHOLINE CHLORIDE 200 MG/10ML IV SOSY
PREFILLED_SYRINGE | INTRAVENOUS | Status: AC
  Filled 2020-05-30: qty 20

## 2020-05-30 MED ORDER — CHLORHEXIDINE GLUCONATE 0.12 % MT SOLN
OROMUCOSAL | Status: AC
  Filled 2020-05-30: qty 15

## 2020-05-30 MED ORDER — LACTATED RINGERS IV SOLN: INTRAVENOUS | Status: DC

## 2020-05-30 MED ORDER — FENTANYL CITRATE (PF) 100 MCG/2ML IJ SOLN: 25.0000 ug | INTRAMUSCULAR | Status: DC | PRN

## 2020-05-30 MED ORDER — ROCURONIUM BROMIDE 10 MG/ML (PF) SYRINGE
PREFILLED_SYRINGE | INTRAVENOUS | Status: AC
  Filled 2020-05-30: qty 20

## 2020-05-30 MED ORDER — POVIDONE-IODINE 10 % EX SWAB: 2.0000 "application " | Freq: Once | CUTANEOUS | Status: DC

## 2020-05-30 MED ORDER — MIDAZOLAM HCL 2 MG/2ML IJ SOLN
INTRAMUSCULAR | Status: AC
  Filled 2020-05-30: qty 2

## 2020-05-30 MED ORDER — KETOROLAC TROMETHAMINE 30 MG/ML IJ SOLN
INTRAMUSCULAR | Status: DC | PRN
  Administered 2020-05-30: 30 mg via INTRAVENOUS

## 2020-05-30 MED ORDER — DEXAMETHASONE SODIUM PHOSPHATE 10 MG/ML IJ SOLN
INTRAMUSCULAR | Status: DC | PRN
  Administered 2020-05-30: 10 mg via INTRAVENOUS

## 2020-05-30 MED ORDER — ACETAMINOPHEN 10 MG/ML IV SOLN
INTRAVENOUS | Status: DC | PRN
  Administered 2020-05-30: 1000 mg via INTRAVENOUS

## 2020-05-30 MED ORDER — ACETAMINOPHEN 10 MG/ML IV SOLN
INTRAVENOUS | Status: AC
  Filled 2020-05-30: qty 100

## 2020-05-30 MED ORDER — EPHEDRINE 5 MG/ML INJ
INTRAVENOUS | Status: AC
  Filled 2020-05-30: qty 10

## 2020-05-30 MED ORDER — PROPOFOL 10 MG/ML IV BOLUS
INTRAVENOUS | Status: DC | PRN
  Administered 2020-05-30: 150 mg via INTRAVENOUS

## 2020-05-30 MED ORDER — GLYCOPYRROLATE 0.2 MG/ML IJ SOLN
INTRAMUSCULAR | Status: DC | PRN
  Administered 2020-05-30: .2 mg via INTRAVENOUS

## 2020-05-30 MED ORDER — SUGAMMADEX SODIUM 500 MG/5ML IV SOLN
INTRAVENOUS | Status: AC
  Filled 2020-05-30: qty 15

## 2020-05-30 MED ORDER — FENTANYL CITRATE (PF) 100 MCG/2ML IJ SOLN
INTRAMUSCULAR | Status: DC | PRN
  Administered 2020-05-30 (×3): 50 ug via INTRAVENOUS

## 2020-05-30 MED ORDER — SILVER NITRATE-POT NITRATE 75-25 % EX MISC
CUTANEOUS | Status: AC
  Filled 2020-05-30: qty 10

## 2020-05-30 MED ORDER — TRAMADOL HCL 50 MG PO TABS: 50.0000 mg | ORAL_TABLET | Freq: Four times a day (QID) | ORAL | 0 refills | Status: AC | PRN

## 2020-05-30 MED ORDER — GLYCOPYRROLATE 0.2 MG/ML IJ SOLN
INTRAMUSCULAR | Status: AC
  Filled 2020-05-30: qty 3

## 2020-05-30 MED ORDER — ONDANSETRON HCL 4 MG/2ML IJ SOLN
INTRAMUSCULAR | Status: DC | PRN
  Administered 2020-05-30 (×2): 4 mg via INTRAVENOUS

## 2020-05-30 MED ORDER — IBUPROFEN 600 MG PO TABS: 600.0000 mg | ORAL_TABLET | Freq: Four times a day (QID) | ORAL | 0 refills | Status: DC | PRN

## 2020-05-30 MED ORDER — FAMOTIDINE 20 MG PO TABS
ORAL_TABLET | ORAL | Status: AC
  Filled 2020-05-30: qty 1

## 2020-05-30 MED ORDER — FAMOTIDINE 20 MG PO TABS
20.0000 mg | ORAL_TABLET | Freq: Once | ORAL | Status: AC
  Administered 2020-05-30: 20 mg via ORAL

## 2020-05-30 MED ORDER — ONDANSETRON HCL 4 MG/2ML IJ SOLN
INTRAMUSCULAR | Status: AC
  Filled 2020-05-30: qty 6

## 2020-05-30 MED ORDER — PROMETHAZINE HCL 25 MG/ML IJ SOLN: 6.2500 mg | INTRAMUSCULAR | Status: DC | PRN

## 2020-05-30 MED ORDER — MIDAZOLAM HCL 2 MG/2ML IJ SOLN
INTRAMUSCULAR | Status: DC | PRN
  Administered 2020-05-30: 2 mg via INTRAVENOUS

## 2020-05-30 MED ORDER — CHLORHEXIDINE GLUCONATE 0.12 % MT SOLN
15.0000 mL | Freq: Once | OROMUCOSAL | Status: AC
  Administered 2020-05-30: 13:00:00 15 mL via OROMUCOSAL

## 2020-05-30 MED ORDER — DEXAMETHASONE SODIUM PHOSPHATE 10 MG/ML IJ SOLN
INTRAMUSCULAR | Status: AC
  Filled 2020-05-30: qty 2

## 2020-05-30 MED ORDER — ROCURONIUM BROMIDE 100 MG/10ML IV SOLN
INTRAVENOUS | Status: DC | PRN
  Administered 2020-05-30: 10 mg via INTRAVENOUS

## 2020-05-30 MED ORDER — LIDOCAINE HCL (CARDIAC) PF 100 MG/5ML IV SOSY
PREFILLED_SYRINGE | INTRAVENOUS | Status: DC | PRN
  Administered 2020-05-30: 100 mg via INTRAVENOUS

## 2020-05-30 MED ORDER — SUCCINYLCHOLINE CHLORIDE 20 MG/ML IJ SOLN
INTRAMUSCULAR | Status: DC | PRN
  Administered 2020-05-30: 120 mg via INTRAVENOUS

## 2020-05-30 MED ORDER — ACETIC ACID 3 % SOLN
Status: AC
  Filled 2020-05-30: qty 500

## 2020-05-30 MED ORDER — ORAL CARE MOUTH RINSE: 15.0000 mL | Freq: Once | OROMUCOSAL | Status: AC

## 2020-05-30 SURGICAL SUPPLY — 21 items
CATH ROBINSON RED A/P 16FR (CATHETERS) ×2 IMPLANT
DEVICE MYOSURE LITE (MISCELLANEOUS) IMPLANT
DEVICE MYOSURE REACH (MISCELLANEOUS) ×1 IMPLANT
ELECT REM PT RETURN 9FT ADLT (ELECTROSURGICAL)
ELECTRODE REM PT RTRN 9FT ADLT (ELECTROSURGICAL) IMPLANT
GAUZE 4X4 16PLY RFD (DISPOSABLE) ×2 IMPLANT
GLOVE BIOGEL PI IND STRL 6.5 (GLOVE) ×2 IMPLANT
GLOVE BIOGEL PI INDICATOR 6.5 (GLOVE) ×2
GLOVE SURG SYN 6.5 ES PF (GLOVE) ×4 IMPLANT
GLOVE SURG SYN 6.5 PF PI (GLOVE) ×1 IMPLANT
GOWN STRL REUS W/ TWL LRG LVL3 (GOWN DISPOSABLE) ×2 IMPLANT
GOWN STRL REUS W/TWL LRG LVL3 (GOWN DISPOSABLE) ×2
KIT PROCEDURE FLUENT (KITS) ×1 IMPLANT
MANIFOLD NEPTUNE II (INSTRUMENTS) ×1 IMPLANT
PACK DNC HYST (MISCELLANEOUS) ×2 IMPLANT
PAD OB MATERNITY 4.3X12.25 (PERSONAL CARE ITEMS) ×2 IMPLANT
PAD PREP 24X41 OB/GYN DISP (PERSONAL CARE ITEMS) ×2 IMPLANT
SEAL ROD LENS SCOPE MYOSURE (ABLATOR) ×2 IMPLANT
SOL .9 NS 3000ML IRR  AL (IV SOLUTION) ×1
SOL .9 NS 3000ML IRR UROMATIC (IV SOLUTION) ×1 IMPLANT
TOWEL OR 17X26 4PK STRL BLUE (TOWEL DISPOSABLE) ×2 IMPLANT

## 2020-05-30 NOTE — Op Note (Signed)
Operative Note  05/30/2020  PRE-OP DIAGNOSIS: Postmenopausal bleeding, Abnormal pap smear  POST-OP DIAGNOSIS: same, endometrial polyp  SURGEON: Brihana Quickel MD  PROCEDURE: Procedure(s): DILATATION AND CURETTAGE /HYSTEROSCOPY/ MYOSURE POLYPECTOMY COLPOSCOPY   ANESTHESIA: Choice   ESTIMATED BLOOD LOSS: less than 1 cc   SPECIMENS:  6 o'clock cervical biopsy, endometrial polyps and curettings   FLUID DEFICIT: minimal  COMPLICATIONS: None  DISPOSITION: PACU - hemodynamically stable.  CONDITION: stable  FINDINGS: Exam under anesthesia revealed  8cm uterus with bilateral adnexa without masses or fullness. Hysteroscopy revealed two endometrial polyps in the uterine cavity with normal  bilateral tubal ostia and normal appearing endocervical canal.  PROCEDURE IN DETAIL: After informed consent was obtained, the patient was taken to the operating room where anesthesia was obtained without difficulty. The patient was positioned in the dorsal lithotomy position in [place] stirrups. The patient's bladder was catheterized with an in and out foley catheter. The patient was examined under anesthesia, with the above noted findings. The right angle retractor was placed inside the patient's vagina, and the the anterior lip of the cervix was seen and grasped with the tenaculum.  Colposcopy was performed. The cervix was biopsied at the 6 o'clock location. The cervix was stenotic. Lacrimal dilators were used to gently dilate the cervix. Then the cervix was progressively dilated to a 18 French-Pratt dilator.  The uterine cavity was sounded to 8 cm. The 0 degree hysteroscope was introduced, with saline fluid used to distend the intrauterine cavity, with the above noted findings.  The Myosure was used to remove the uterine polyp. Once the cavity was sampled entirely the hysteroscope was removed.   The uterine cavity was curetted until a gritty texture was noted, yielding endometrial curettings. Excellent  hemostasis was noted, and all instruments were removed, with excellent hemostasis noted throughout. She was then taken out of dorsal lithotomy. Minimal discrepancy in fluid was noted.  The patient tolerated the procedure well. Sponge, lap and needle counts were correct x2. The patient was taken to recovery room in excellent condition.  Adelene Idler MD [facility], [facility] 05/30/2020 4:37 PM

## 2020-05-30 NOTE — Transfer of Care (Signed)
Immediate Anesthesia Transfer of Care Note  Patient: Maureen King  Procedure(s) Performed: DILATATION AND CURETTAGE /HYSTEROSCOPY (N/A ) COLPOSCOPY (N/A )  Patient Location: PACU  Anesthesia Type:General  Level of Consciousness: awake, oriented and patient cooperative  Airway & Oxygen Therapy: Patient Spontanous Breathing and Patient connected to face mask oxygen  Post-op Assessment: Report given to RN and Post -op Vital signs reviewed and stable  Post vital signs: Reviewed and stable  Last Vitals:  Vitals Value Taken Time  BP 113/52 05/30/20 1646  Temp    Pulse 76 05/30/20 1647  Resp 15 05/30/20 1647  SpO2 95 % 05/30/20 1647  Vitals shown include unvalidated device data.  Last Pain:  Vitals:   05/30/20 1221  TempSrc: Temporal  PainSc: 0-No pain         Complications: No complications documented.

## 2020-05-30 NOTE — Discharge Instructions (Signed)
AMBULATORY SURGERY  DISCHARGE INSTRUCTIONS   1) The drugs that you were given will stay in your system until tomorrow so for the next 24 hours you should not:  A) Drive an automobile B) Make any legal decisions C) Drink any alcoholic beverage   2) You may resume regular meals tomorrow.  Today it is better to start with liquids and gradually work up to solid foods.  You may eat anything you prefer, but it is better to start with liquids, then soup and crackers, and gradually work up to solid foods.   3) Please notify your doctor immediately if you have any unusual bleeding, trouble breathing, redness and pain at the surgery site, drainage, fever, or pain not relieved by medication.    4) Additional Instructions:        Please contact your physician with any problems or Same Day Surgery at [phone], Monday through Friday 6 am to 4 pm, or [facility] number at [phone].Hysteroscopy, Care After This sheet gives you information about how to care for yourself after your procedure. Your health care provider may also give you more specific instructions. If you have problems or questions, contact your health care provider. What can I expect after the procedure? After the procedure, it is common to have:  Cramping.  Bleeding. This can vary from light spotting to menstrual-like bleeding. Follow these instructions at home: Activity  Rest for 1-2 days after the procedure.  Do not douche, use tampons, or have sex for 2 weeks after the procedure, or until your health care provider approves.  Do not drive for 24 hours after the procedure, or for as long as told by your health care provider.  Do not drive, use heavy machinery, or drink alcohol while taking prescription pain medicines. Medicines   Take over-the-counter and prescription medicines only as told by your health care provider.  Do not take aspirin during recovery. It can increase the risk  of bleeding. General instructions  Do not take baths, swim, or use a hot tub until your health care provider approves. Take showers instead of baths for 2 weeks, or for as long as told by your health care provider.  To prevent or treat constipation while you are taking prescription pain medicine, your health care provider may recommend that you: ? Drink enough fluid to keep your urine clear or pale yellow. ? Take over-the-counter or prescription medicines. ? Eat foods that are high in fiber, such as fresh fruits and vegetables, whole grains, and beans. ? Limit foods that are high in fat and processed sugars, such as fried and sweet foods.  Keep all follow-up visits as told by your health care provider. This is important. Contact a health care provider if:  You feel dizzy or lightheaded.  You feel nauseous.  You have abnormal vaginal discharge.  You have a rash.  You have pain that does not get better with medicine.  You have chills. Get help right away if:  You have bleeding that is heavier than a normal menstrual period.  You have a fever.  You have pain or cramps that get worse.  You develop new abdominal pain.  You faint.  You have pain in your shoulders.  You have shortness of breath. Summary  After the procedure, you may have cramping and some vaginal bleeding.  Do not douche, use tampons, or have sex for 2 weeks after the procedure, or until your health care provider approves.  Do not take baths, swim, or use a hot tub until your health care provider approves. Take showers instead of baths for 2 weeks, or for as long as told by your health care provider.  Report any unusual symptoms to your health care provider.  Keep all follow-up visits as told by your health care provider. This is important. This information is not intended to replace advice given to you by your health care provider. Make sure you discuss any questions you have with your health care  provider. Document Revised: 04/30/2017 Document Reviewed: 06/16/2016 Elsevier Patient Education  2020 [employer]   5) The drugs that you were given will stay in your system until tomorrow so for the next 24 hours you should not:  D) Drive an automobile E) Make any legal decisions F) Drink any alcoholic beverage   6) You may resume regular meals tomorrow.  Today it is better to start with liquids and gradually work up to solid foods.  You may eat anything you prefer, but it is better to start with liquids, then soup and crackers, and gradually work up to solid foods.   7) Please notify your doctor immediately if you have any unusual bleeding, trouble breathing, redness and pain at the surgery site, drainage, fever, or pain not relieved by medication.  8) Your post-operative visit with Dr.                                     is: Date:                        Time:    Please call to schedule your post-operative visit.  9) Additional Instructions:

## 2020-05-30 NOTE — Anesthesia Preprocedure Evaluation (Signed)
Anesthesia Evaluation  Patient identified by MRN, date of birth, ID band Patient awake    Reviewed: Allergy & Precautions, H&P , NPO status , Patient's Chart, lab work & pertinent test results, reviewed documented beta blocker date and time   History of Anesthesia Complications Negative for: history of anesthetic complications  Airway Mallampati: III  TM Distance: >3 FB Neck ROM: full  Mouth opening: Limited Mouth Opening  Dental  (+) Dental Advidsory Given, Teeth Intact, Caps   Pulmonary neg pulmonary ROS,    Pulmonary exam normal breath sounds clear to auscultation       Cardiovascular Exercise Tolerance: Good negative cardio ROS Normal cardiovascular exam Rhythm:regular Rate:Normal     Neuro/Psych PSYCHIATRIC DISORDERS Anxiety Depression negative neurological ROS     GI/Hepatic Neg liver ROS, GERD  Controlled,  Endo/Other  neg diabetesMorbid obesity  Renal/GU negative Renal ROS  negative genitourinary   Musculoskeletal   Abdominal   Peds  Hematology negative hematology ROS (+)   Anesthesia Other Findings Past Medical History: No date: Anxiety No date: Depression No date: GERD (gastroesophageal reflux disease) No date: Insomnia No date: Obesity   Reproductive/Obstetrics negative OB ROS                             Anesthesia Physical Anesthesia Plan  ASA: III  Anesthesia Plan: General   Post-op Pain Management:    Induction: Intravenous  PONV Risk Score and Plan: 3 and Ondansetron, Dexamethasone, Midazolam, Treatment may vary due to age or medical condition and Promethazine  Airway Management Planned: LMA  Additional Equipment:   Intra-op Plan:   Post-operative Plan: Extubation in OR  Informed Consent: I have reviewed the patients History and Physical, chart, labs and discussed the procedure including the risks, benefits and alternatives for the proposed anesthesia  with the patient or authorized representative who has indicated his/her understanding and acceptance.     Dental Advisory Given  Plan Discussed with: Anesthesiologist, CRNA and Surgeon  Anesthesia Plan Comments:         Anesthesia Quick Evaluation

## 2020-05-30 NOTE — Anesthesia Procedure Notes (Signed)
Procedure Name: Intubation Performed by: Fletcher-Harrison, Quinetta Shilling, CRNA Pre-anesthesia Checklist: Patient identified, Emergency Drugs available, Suction available and Patient being monitored Patient Re-evaluated:Patient Re-evaluated prior to induction Oxygen Delivery Method: Circle system utilized Preoxygenation: Pre-oxygenation with 100% oxygen Induction Type: IV induction Ventilation: Mask ventilation without difficulty Laryngoscope Size: McGraph and 3 Tube type: Oral Tube size: 7.0 mm Number of attempts: 1 Airway Equipment and Method: Stylet and Oral airway Placement Confirmation: ETT inserted through vocal cords under direct vision,  positive ETCO2,  breath sounds checked- equal and bilateral and CO2 detector Secured at: 21 cm Tube secured with: Tape Dental Injury: Teeth and Oropharynx as per pre-operative assessment        

## 2020-05-30 NOTE — Interval H&P Note (Signed)
History and Physical Interval Note:  05/30/2020 3:08 PM  Maureen King  has presented today for surgery, with the diagnosis of postmenopausal bleeding ASCUS HPV positive.  The various methods of treatment have been discussed with the patient and family. After consideration of risks, benefits and other options for treatment, the patient has consented to  Procedure(s): DILATATION AND CURETTAGE /HYSTEROSCOPY (N/A) COLPOSCOPY (N/A) as a surgical intervention.  The patient's history has been reviewed, patient examined, no change in status, stable for surgery.  I have reviewed the patient's chart and labs.  Questions were answered to the patient's satisfaction.     [name] R Cordero Surette

## 2020-05-31 ENCOUNTER — Encounter: Payer: Self-pay | Admitting: Obstetrics and Gynecology

## 2020-06-04 LAB — SURGICAL PATHOLOGY

## 2020-06-07 ENCOUNTER — Other Ambulatory Visit: Payer: Self-pay

## 2020-06-07 ENCOUNTER — Ambulatory Visit (INDEPENDENT_AMBULATORY_CARE_PROVIDER_SITE_OTHER): Payer: Managed Care, Other (non HMO) | Admitting: Obstetrics and Gynecology

## 2020-06-07 ENCOUNTER — Encounter: Payer: Self-pay | Admitting: Obstetrics and Gynecology

## 2020-06-07 VITALS — BP 130/70 | Ht 60.0 in | Wt 222.8 lb

## 2020-06-07 DIAGNOSIS — N8500 Endometrial hyperplasia, unspecified: Secondary | ICD-10-CM

## 2020-06-07 DIAGNOSIS — N950 Postmenopausal bleeding: Secondary | ICD-10-CM

## 2020-06-07 NOTE — Progress Notes (Signed)
  Postoperative Follow-up Patient presents post op from hysteroscopy D&C for postmenopausal bleeding, 1 week ago.  Subjective: Patient reports some improvement in her preop symptoms. Eating a regular diet without difficulty. Pain is controlled without any medications.  Activity: normal activities of daily living. Patient reports additional symptom's since surgery of None.  Objective: BP 130/70   Ht 5' (1.524 m)   Wt 222 lb 12.8 oz (101.1 kg)   LMP  (LMP Unknown)   BMI 43.51 kg/m  Physical Exam Constitutional:      Appearance: Normal appearance.  HENT:     Nose: Nose normal.  Eyes:     Extraocular Movements: Extraocular movements intact.     Pupils: Pupils are equal, round, and reactive to light.  Cardiovascular:     Rate and Rhythm: Normal rate and regular rhythm.  Pulmonary:     Effort: Pulmonary effort is normal.     Breath sounds: Normal breath sounds.  Abdominal:     Palpations: Abdomen is soft.  Neurological:     Mental Status: She is alert and oriented to person, place, and time.  Skin:    General: Skin is warm and dry.  Psychiatric:        Mood and Affect: Mood normal.        Behavior: Behavior normal.        Thought Content: Thought content normal.        Judgment: Judgment normal.     Assessment: s/p :  Hysteroscopy D&C stable  Plan: Patient has done well after surgery with no apparent complications.  I have discussed the post-operative course to date, and the expected progress moving forward.  The patient understands what complications to be concerned about.  I will see the patient in routine follow up, or sooner if needed.    Activity plan: No restriction. Pelvic rest.  We discussed her pathology report which showed hyperplasia without atypia. Given her risk factor for endometrial cancer including elevated BMI hysterectomy was advised. Discussed pros and cons of ovarian conservation. She will follow up to plan this procedure. Note sent to surgical  scheduler.  Frans Valente R Aaiden Depoy 06/13/2020, 7:28 AM

## 2020-06-07 NOTE — Progress Notes (Signed)
Post OP 

## 2020-06-07 NOTE — Patient Instructions (Signed)
Total Laparoscopic Hysterectomy A total laparoscopic hysterectomy is a minimally invasive surgery to remove the uterus and cervix. The fallopian tubes and ovaries can also be removed (bilateral salpingo-oophorectomy) during this surgery, if necessary. This procedure may be done to treat problems such as:  Noncancerous growths in the uterus (uterine fibroids) that cause symptoms.  A condition that causes the lining of the uterus (endometrium) to grow in other areas (endometriosis).  Problems with pelvic support. This is caused by weakened muscles of the pelvis following vaginal childbirth or menopause.  Cancer of the cervix, ovaries, uterus, or endometrium.  Excessive (dysfunctional) uterine bleeding. This surgery is performed by inserting a thin, lighted tube (laparoscope) and surgical instruments into small incisions in the abdomen. The laparoscope sends images to a monitor. The images help the health care provider perform the procedure. After this procedure, you will no longer be able to have a baby, and you will no longer have a menstrual period. Tell a health care provider about:  Any allergies you have.  All medicines you are taking, including vitamins, herbs, eye drops, creams, and over-the-counter medicines.  Any problems you or family members have had with anesthetic medicines.  Any blood disorders you have.  Any surgeries you have had.  Any medical conditions you have.  Whether you are pregnant or may be pregnant. What are the risks? Generally, this is a safe procedure. However, problems may occur, including:  Infection.  Bleeding.  Blood clots in the legs or lungs.  Allergic reactions to medicines.  Damage to other structures or organs.  The risk that the surgery may have to be switched to the regular one in which a large incision is made in the abdomen (abdominal hysterectomy). What happens before the procedure? Staying hydrated Follow instructions from your  health care provider about hydration, which may include:  Up to 2 hours before the procedure - you may continue to drink clear liquids, such as water, clear fruit juice, black coffee, and plain tea Eating and drinking restrictions Follow instructions from your health care provider about eating and drinking, which may include:  8 hours before the procedure - stop eating heavy meals or foods such as meat, fried foods, or fatty foods.  6 hours before the procedure - stop eating light meals or foods, such as toast or cereal.  6 hours before the procedure - stop drinking milk or drinks that contain milk.  2 hours before the procedure - stop drinking clear liquids. Medicines  Ask your health care provider about: ? Changing or stopping your regular medicines. This is especially important if you are taking diabetes medicines or blood thinners. ? Taking over-the-counter medicines, vitamins, herbs, and supplements. ? Taking medicines such as aspirin and ibuprofen. These medicines can thin your blood. Do not take these medicines unless your health care provider tells you to take them.  You may be given antibiotic medicine to help prevent infection.  You may be asked to take laxatives.  You may be given medicines to help prevent nausea and vomiting after the procedure. General instructions  Ask your health care provider how your surgical site will be marked or identified.  You may be asked to shower with a germ-killing soap.  Do not use any products that contain nicotine or tobacco, such as cigarettes and e-cigarettes. If you need help quitting, ask your health care provider.  You may have an exam or testing, such as an ultrasound to determine the size and shape of your pelvic organs.    You may have a blood or urine sample taken.  This procedure can affect the way you feel about yourself. Talk with your health care provider about the physical and emotional changes hysterectomy may  cause.  Plan to have someone take you home from the hospital or clinic.  Plan to have a responsible adult care for you for at least 24 hours after you leave the hospital or clinic. This is important. What happens during the procedure?  To lower your risk of infection: ? Your health care team will wash or sanitize their hands. ? Your skin will be washed with soap. ? Hair may be removed from the surgical area.  An IV will be inserted into one of your veins.  You will be given one or more of the following: ? A medicine to help you relax (sedative). ? A medicine to make you fall asleep (general anesthetic).  You will be given antibiotic medicine through your IV.  A tube may be inserted down your throat to help you breathe during the procedure.  A gas (carbon dioxide) will be used to inflate your abdomen to allow your surgeon to see inside of your abdomen.  Three or four small incisions will be made in your abdomen.  A laparoscope will be inserted into one of your incisions. Surgical instruments will be inserted through the other incisions in order to perform the procedure.  Your uterus and cervix may be removed through your vagina or cut into small pieces and removed through the small incisions. Any other organs that need to be removed will also be removed this way.  Carbon dioxide will be released from inside of your abdomen.  Your incisions will be closed with stitches (sutures).  A bandage (dressing) may be placed over your incisions. The procedure may vary among health care providers and hospitals. What happens after the procedure?  Your blood pressure, heart rate, breathing rate, and blood oxygen level will be monitored until the medicines you were given have worn off.  You will be given medicine for pain and nausea as needed.  Do not drive for 24 hours if you received a sedative. Summary  Total Laparoscopic hysterectomy is a procedure to remove your uterus, cervix and  sometimes the fallopian tubes and ovaries.  This procedure can affect the way you feel about yourself. Talk with your health care provider about the physical and emotional changes hysterectomy may cause.  After this procedure, you will no longer be able to have a baby, and you will no longer have a menstrual period.  You will be given pain medicine to control discomfort after this procedure. This information is not intended to replace advice given to you by your health care provider. Make sure you discuss any questions you have with your health care provider. Document Revised: 04/30/2017 Document Reviewed: 07/29/2016 Elsevier Patient Education  2020 [employer] Inc[employer]  

## 2020-06-11 NOTE — Anesthesia Postprocedure Evaluation (Signed)
Anesthesia Post Note  Patient: Maureen King  Procedure(s) Performed: DILATATION AND CURETTAGE /HYSTEROSCOPY (N/A ) COLPOSCOPY (N/A )  Patient location during evaluation: PACU Anesthesia Type: General Level of consciousness: awake and alert and oriented Pain management: pain level controlled Vital Signs Assessment: post-procedure vital signs reviewed and stable Respiratory status: spontaneous breathing Cardiovascular status: blood pressure returned to baseline Anesthetic complications: no   No complications documented.   Last Vitals:  Vitals:   05/30/20 1727 05/30/20 1749  BP: 115/68 115/89  Pulse: (!) 58 65  Resp: 16 16  Temp: 36.6 C   SpO2: 99% 98%    Last Pain:  Vitals:   05/30/20 1749  TempSrc:   PainSc: 0-No pain                 [name]

## 2020-06-20 ENCOUNTER — Telehealth: Payer: Self-pay | Admitting: Obstetrics and Gynecology

## 2020-06-20 NOTE — Telephone Encounter (Signed)
Called patient to schedule Robotic hysterectomy w bilateral salpingectomy, cystoscopy w [facility]  DOS 3/31  H&P 3/18 @ 9:50 am    Covid testing 3/29 @ 8-10:30, [address], drive up and wear mask. Advised pt to quarantine until DOS.  Pre-admit phone call appointment to be requested - date and time will be included on H&P paper work. Also all appointments will be updated on pt MyChart. Explained that this appointment has a call window. Based on the time scheduled will indicate if the call will be received within a 4 hour window before 1:00 or after.  Advised that pt may also receive calls from the hospital pharmacy and pre-service center.  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 No secondary insurance.

## 2020-06-20 NOTE — Telephone Encounter (Signed)
-----   Message from Homero Fellers, MD sent at 06/07/2020 12:01 PM EST ----- Surgery Booking Request Patient Full Name:  SENTA KANTOR  MRN: [id number]  DOB: 03-03-1956  Surgeon: Homero Fellers, MD  Requested Surgery Date and Time: Feb 2022 Primary Diagnosis AND Code: Endometrial hyperplasia without atypia Secondary Diagnosis and Code:  Surgical Procedure: robotic hysterectomy, bilateral salpingectomy with cystoscopy RNFA Requested?: No L&D Notification: No Admission Status: same day surgery Length of Surgery: 150 min Special Case Needs: No H&P: Yes Phone Interview???:  Yes Interpreter: No Medical Clearance:  No Special Scheduling Instructions: No Any known health/anesthesia issues, diabetes, sleep apnea, latex allergy, defibrillator/pacemaker?: No Acuity: P2   (P1 highest, P2 delay may cause harm, P3 low, elective gyn, P4 lowest)

## 2020-06-25 ENCOUNTER — Other Ambulatory Visit: Payer: Self-pay

## 2020-06-25 ENCOUNTER — Ambulatory Visit: Payer: Managed Care, Other (non HMO) | Admitting: Family Medicine

## 2020-06-25 ENCOUNTER — Encounter: Payer: Self-pay | Admitting: Family Medicine

## 2020-06-25 VITALS — BP 106/71 | HR 63 | Temp 98.8°F | Wt 224.6 lb

## 2020-06-25 DIAGNOSIS — F3341 Major depressive disorder, recurrent, in partial remission: Secondary | ICD-10-CM | POA: Diagnosis not present

## 2020-06-25 MED ORDER — TRAZODONE HCL 50 MG PO TABS: 25.0000 mg | ORAL_TABLET | Freq: Every evening | ORAL | 3 refills | Status: DC | PRN

## 2020-06-25 MED ORDER — PAROXETINE HCL ER 12.5 MG PO TB24: 12.5000 mg | ORAL_TABLET | Freq: Every day | ORAL | 1 refills | Status: DC

## 2020-06-25 NOTE — Assessment & Plan Note (Addendum)
Under good control on current regimen. Continue current regimen. Continue to monitor. Call with any concerns. Refills given. Will start trazodone for sleep. Call with any concerns.

## 2020-06-25 NOTE — Progress Notes (Signed)
BP 106/71   Pulse 63   Temp 98.8 F (37.1 C)   Wt 224 lb 9.6 oz (101.9 kg)   LMP  (LMP Unknown)   SpO2 98%   BMI 43.86 kg/m    Subjective:    Patient ID: Maureen King, female    DOB: Sep 25, 1955, 65 y.o.   MRN: [id number]  HPI: Maureen King is a 65 y.o. female  Chief Complaint  Patient presents with  . Depression  . Procedure    Patient had hysteroscopy in December and is scheduled in march to get a complete hysterectomy    DEPRESSION- was feeling jittery on the 25mg  of paxil, so went back down to the 12.5. Feels like she's doing OK. Has had a huge amount of stress at work.  Mood status: stable Satisfied with current treatment?: yes Symptom severity: mild  Duration of current treatment : chronic Side effects: no Medication compliance: excellent compliance Psychotherapy/counseling: no  Previous psychiatric medications: paxil Depressed mood: yes Anxious mood: yes Anhedonia: no Significant weight loss or gain: no Insomnia: yes hard to fall asleep Fatigue: yes Feelings of worthlessness or guilt: no Impaired concentration/indecisiveness: no Suicidal ideations: no Hopelessness: no Crying spells: no Depression screen [facility] 2/9 06/25/2020 04/12/2020 11/28/2019 03/16/2019 03/11/2018  Decreased Interest 1 0 1 1 1   Down, Depressed, Hopeless 1 1 1 1 1   PHQ - 2 Score 2 1 2 2 2   Altered sleeping 1 2 1 1 2   Tired, decreased energy 0 1 1 1 1   Change in appetite 0 0 1 1 2   Feeling bad or failure about yourself  0 0 0 0 1  Trouble concentrating 0 3 0 0 1  Moving slowly or fidgety/restless 0 0 0 0 0  Suicidal thoughts 0 0 0 0 0  PHQ-9 Score 3 7 5 5 9   Difficult doing work/chores - Very difficult Somewhat difficult Somewhat difficult Very difficult    Relevant past medical, surgical, family and social history reviewed and updated as indicated. Interim medical history since our last visit reviewed. Allergies and medications reviewed and updated.  Review of Systems   Constitutional: Negative.   Respiratory: Negative.   Cardiovascular: Negative.   Gastrointestinal: Negative.   Musculoskeletal: Negative.   Neurological: Negative.   Psychiatric/Behavioral: Positive for dysphoric mood. Negative for agitation, behavioral problems, confusion, decreased concentration, hallucinations, self-injury, sleep disturbance and suicidal ideas. The patient is nervous/anxious. The patient is not hyperactive.     Per HPI unless specifically indicated above     Objective:    BP 106/71   Pulse 63   Temp 98.8 F (37.1 C)   Wt 224 lb 9.6 oz (101.9 kg)   LMP  (LMP Unknown)   SpO2 98%   BMI 43.86 kg/m   Wt Readings from Last 3 Encounters:  06/25/20 224 lb 9.6 oz (101.9 kg)  06/07/20 222 lb 12.8 oz (101.1 kg)  05/30/20 224 lb 13.9 oz (102 kg)    Physical Exam Vitals and nursing note reviewed.  Constitutional:      General: She is not in acute distress.    Appearance: Normal appearance. She is not ill-appearing, toxic-appearing or diaphoretic.  HENT:     Head: Normocephalic and atraumatic.     Right Ear: External ear normal.     Left Ear: External ear normal.     Nose: Nose normal.     Mouth/Throat:     Mouth: Mucous membranes are moist.     Pharynx: Oropharynx is clear.  Eyes:     General: No scleral icterus.       Right eye: No discharge.        Left eye: No discharge.     Extraocular Movements: Extraocular movements intact.     Conjunctiva/sclera: Conjunctivae normal.     Pupils: Pupils are equal, round, and reactive to light.  Cardiovascular:     Rate and Rhythm: Normal rate and regular rhythm.     Pulses: Normal pulses.     Heart sounds: Normal heart sounds. No murmur heard. No friction rub. No gallop.   Pulmonary:     Effort: Pulmonary effort is normal. No respiratory distress.     Breath sounds: Normal breath sounds. No stridor. No wheezing, rhonchi or rales.  Chest:     Chest wall: No tenderness.  Musculoskeletal:        General: Normal  range of motion.     Cervical back: Normal range of motion and neck supple.  Skin:    General: Skin is warm and dry.     Capillary Refill: Capillary refill takes less than 2 seconds.     Coloration: Skin is not jaundiced or pale.     Findings: No bruising, erythema, lesion or rash.  Neurological:     General: No focal deficit present.     Mental Status: She is alert and oriented to person, place, and time. Mental status is at baseline.  Psychiatric:        Mood and Affect: Mood normal.        Behavior: Behavior normal.        Thought Content: Thought content normal.        Judgment: Judgment normal.     Results for orders placed or performed during the hospital encounter of 05/30/20  ABO/Rh  Result Value Ref Range   ABO/RH(D)      O POS Performed at [facility], [address], [place], [place] [address]   Surgical pathology  Result Value Ref Range   SURGICAL PATHOLOGY      SURGICAL PATHOLOGY CASE: [id number] PATIENT: Maureen King Surgical Pathology Report     Specimen Submitted: A. Cervix, 6:00; biopsy B. Endometrium polyp  curettings  Clinical History: Postmenopausal bleeding, ASCUS HPV positive.    DIAGNOSIS: A. UTERINE CERVIX, 6:00; BIOPSY: - BENIGN ECTOCERVICAL TISSUE. - NO DEFINITE TRANSFORMATION ZONE IDENTIFIED. - NEGATIVE FOR SQUAMOUS INTRAEPITHELIAL LESION AND MALIGNANCY.  B. ENDOMETRIUM; CURETTAGE: - FRAGMENTS OF BENIGN ENDOMETRIAL POLYP WITH ASSOCIATED NONATYPICAL HYPERPLASIA. - BACKGROUND PROLIFERATIVE APPEARING ENDOMETRIUM. - NEGATIVE FOR ATYPICAL HYPERPLASIA/EIN AND MALIGNANCY.  GROSS DESCRIPTION: A. Labeled: 6:00 cervical biopsy Received: Formalin Tissue fragment(s): 2 Size: Range from 0.6-0.7 cm Description: Received on a Telfa pad is a fragment of tan-white opaque soft tissue and a fragment of white translucent soft tissue. Entirely submitted in 1 cassette.  B. Labeled: Endometrial polyp and c uretting Received:  Formalin Tissue fragment(s): Multiple Size: Aggregate, 2.4 x 1.1 x 0.3 cm Description: Received in a mesh bag and on a Telfa pad are fragments of tan-white and red-brown soft tissue. Entirely submitted in 1 cassette.   Final Diagnosis performed by Allena Napoleon, MD.   Electronically signed 06/04/2020 1:06:24PM The electronic signature indicates that the named Attending Pathologist has evaluated the specimen Technical component performed at [facility], [address] [place], [place] [address] Lab: [phone] Dir: Rush Farmer, MD, MMM  Professional component performed at [facility], [facility], [employer], [place], [place] [address] Lab: [phone] Dir: Dellia Nims. Reuel Derby, MD  Assessment & Plan:   Problem List Items Addressed This Visit      Other   Depression - Primary    Under good control on current regimen. Continue current regimen. Continue to monitor. Call with any concerns. Refills given. Will start trazodone for sleep. Call with any concerns.            Follow up plan: Return in about 6 months (around 12/23/2020) for physical.

## 2020-08-16 ENCOUNTER — Ambulatory Visit (INDEPENDENT_AMBULATORY_CARE_PROVIDER_SITE_OTHER): Payer: Managed Care, Other (non HMO) | Admitting: Obstetrics and Gynecology

## 2020-08-16 ENCOUNTER — Other Ambulatory Visit: Payer: Self-pay

## 2020-08-16 ENCOUNTER — Encounter: Payer: Self-pay | Admitting: Obstetrics and Gynecology

## 2020-08-16 VITALS — BP 110/80 | Ht 60.0 in | Wt 228.0 lb

## 2020-08-16 DIAGNOSIS — N950 Postmenopausal bleeding: Secondary | ICD-10-CM

## 2020-08-16 DIAGNOSIS — N8500 Endometrial hyperplasia, unspecified: Secondary | ICD-10-CM | POA: Diagnosis not present

## 2020-08-16 NOTE — Patient Instructions (Signed)
Laparoscopically Assisted Vaginal Hysterectomy, Care After The following information offers guidance on how to care for yourself after your procedure. Your health care provider may also give you more specific instructions. If you have problems or questions, contact your health care provider. What can I expect after the procedure? After the procedure, it is common to have:  Soreness and numbness in your incision areas.  Abdominal pain. You will be given pain medicine to control it.  Vaginal bleeding and discharge. You will need to use a sanitary pad after this procedure.  Tiredness (fatigue).  Poor appetite.  Less interest in sex.  Feelings of sadness or other emotions. If your ovaries were also removed, it is common to have symptoms of menopause, such as hot flashes, night sweats, and lack of sleep (insomnia). Follow these instructions at home: Medicines  Take over-the-counter and prescription medicines only as told by your health care provider.  Do not take aspirin or NSAIDs, such as ibuprofen. These medicines can cause bleeding.  Ask your health care provider if the medicine prescribed to you: ? Requires you to avoid driving or using heavy machinery. ? Can cause constipation. You may need to take these actions to prevent or treat constipation:  Drink enough fluid to keep your urine pale yellow.  Take over-the-counter or prescription medicines.  Eat foods that are high in fiber, such as beans, whole grains, and fresh fruits and vegetables.  Limit foods that are high in fat and processed sugars, such as fried or sweet foods. Incision care  Follow instructions from your health care provider about how to take care of your incisions. Make sure you: ? Wash your hands with soap and water for at least 20 seconds before and after you change your bandage (dressing). If soap and water are not available, use hand sanitizer. ? Change your dressing as told by your health care  provider. ? Leave stitches (sutures), skin glue, or adhesive strips in place. These skin closures may need to stay in place for 2 weeks or longer. If adhesive strip edges start to loosen and curl up, you may trim the loose edges. Do not remove adhesive strips completely unless your health care provider tells you to do that.  Check your incision areas every day for signs of infection. Check for: ? More redness, swelling, or pain. ? Fluid or blood. ? Warmth. ? Pus or a bad smell.   Activity  Rest as told by your healthcare provider.  Return to your normal activities as told by your health care provider. Ask your health care provider what activities are safe for you.  Avoid sitting for a long time without moving. Get up to take short walks every 1-2 hours. This is important to improve blood flow and breathing. Ask for help if you feel weak or unsteady.  Do not lift anything that is heavier than 10 lb (4.5 kg), or the limit that you are told, until your health care provider says that it is safe.  If you were given a sedative during the procedure, it can affect you for several hours. Do not drive or operate machinery until your health care provider says that it is safe.   Lifestyle  Do not use any products that contain nicotine or tobacco. These products include cigarettes, chewing tobacco, and vaping devices, such as e-cigarettes. These can delay healing after surgery. If you need help quitting, ask your health care provider.  Do not drink alcohol until your health care provider approves. General  instructions  Do not douche, use tampons, or have sex for at least 6 weeks, or as told by your health care provider.  If you struggle with physical or emotional changes after your procedure, speak with your health care provider or a therapist.  Do not take baths, swim, or use a hot tub until your health care provider approves. You may only be allowed to take showers for 2-3 weeks.  Keep your  dressing dry until your health care provider says it can be removed.  Try to have someone at home with you for the first 1-2 weeks to help with your daily chores.  Wear compression stockings as told by your health care provider. These stockings help to prevent blood clots and reduce swelling in your legs.  Keep all follow-up visits. This is important.   Contact a health care provider if:  You have any of these signs of infection: ? More redness, swelling, warmth, or pain around an incision. ? Fluid or blood coming from an incision. ? Pus or a bad smell coming from an incision. ? Chills or a fever.  An incision opens.  Your pain medicine is not helping.  You feel dizzy or light-headed.  You have pain or bleeding when you urinate.  You have nausea and vomiting that does not go away.  You have pus, or a bad-smelling discharge coming from your vagina. Get help right away if:  You have a fever and your symptoms suddenly get worse.  You have severe abdominal pain.  You have chest pain.  You have shortness of breath.  You faint.  You have pain, swelling, or redness in your leg.  You have heavy vaginal bleeding and blood clots, soaking through a sanitary pad in less than 1 hour. These symptoms may represent a serious problem that is an emergency. Do not wait to see if the symptoms will go away. Get medical help right away. Call your local emergency services (911 in the [place] Do not drive yourself to the hospital. Summary  After the procedure, it is common to have abdominal pain and vaginal bleeding.  Wear a sanitary pad for vaginal discharge or bleeding.  You should not drive or lift heavy objects until your health care provider says that it is safe.  Contact your health care provider if you have any symptoms of infection, heavy vaginal bleeding, nausea, vomiting, or shortness of breath. This information is not intended to replace advice given to you by your health care  provider. Make sure you discuss any questions you have with your health care provider. Document Revised: 01/19/2020 Document Reviewed: 01/19/2020 Elsevier Patient Education  [place].

## 2020-08-16 NOTE — H&P (View-Only) (Signed)
Patient ID: Maureen King, female   DOB: Sep 29, 1955, 65 y.o.   MRN: [id number]  Reason for Consult: Pre-op Exam   Referred by Valerie Roys, DO  Subjective:     HPI:  Maureen King is a 65 y.o. female. She reports she has not been having further vaginal bleeding. She is here for a preoperative visit. She has not new concerns. She reports that she would like to have her ovaries removed.   Prior D&C 05/30/2020: - FRAGMENTS OF BENIGN ENDOMETRIAL POLYP WITH ASSOCIATED NONATYPICAL  HYPERPLASIA.  - BACKGROUND PROLIFERATIVE APPEARING ENDOMETRIUM.  - NEGATIVE FOR ATYPICAL HYPERPLASIA/EIN AND MALIGNANCY.  Pap smear: 05/15/2020 NIL, HPV +  16, 18,45 negative   Past Medical History:  Diagnosis Date  . Anxiety   . Depression   . GERD (gastroesophageal reflux disease)   . Insomnia   . Obesity    Family History  Problem Relation Age of Onset  . Hypertension Mother   . Osteoporosis Mother   . Heart disease Father 61       Stent  . Lung disease Father   . Idiopathic pulmonary fibrosis Father   . Hypertension Brother   . Cancer Maternal Grandmother        gallbladder  . Heart disease Paternal Grandfather        MI  . Breast cancer Neg Hx    Past Surgical History:  Procedure Laterality Date  . COLONOSCOPY    . COLPOSCOPY N/A 05/30/2020   Procedure: COLPOSCOPY;  Surgeon: Homero Fellers, MD;  Location: [facility] ORS;  Service: Gynecology;  Laterality: N/A;  . HYSTEROSCOPY WITH D & C N/A 05/30/2020   Procedure: DILATATION AND CURETTAGE /HYSTEROSCOPY;  Surgeon: Homero Fellers, MD;  Location: [facility] ORS;  Service: Gynecology;  Laterality: N/A;  . NO PAST SURGERIES      Short Social History:  Social History   Tobacco Use  . Smoking status: Never Smoker  . Smokeless tobacco: Never Used  Substance Use Topics  . Alcohol use: Yes    Alcohol/week: 0.0 standard drinks    Comment: on occasion (4x/year)    Allergies  Allergen Reactions  . Penicillins Rash   . Neosporin [Neomycin-Polymyxin-Gramicidin] Rash    Current Outpatient Medications  Medication Sig Dispense Refill  . aspirin EC 81 MG tablet Take 81 mg by mouth daily. Swallow whole.    . B Complex-C (B-COMPLEX WITH VITAMIN C) tablet Take 1 tablet by mouth daily.    . Calcium Carb-Cholecalciferol (CALTRATE 600+D3 PO) Take 1 tablet by mouth daily.    . Cholecalciferol (VITAMIN D3) 50 MCG (2000 UT) TABS Take 2,000 Units by mouth daily.    Marland Kitchen HOMEOPATHIC PRODUCTS PO Take 1 capsule by mouth daily. doTerra On Guard+ Protective Essential Oil Blend    . ibuprofen (ADVIL) 600 MG tablet Take 1 tablet (600 mg total) by mouth every 6 (six) hours as needed. 60 tablet 0  . Magnesium 250 MG TABS Take 250 mg by mouth daily.    . Misc Natural Products (ELDERBERRY ZINC/VIT C/IMMUNE MT) Take 1 capsule by mouth daily.    Marland Kitchen PARoxetine (PAXIL-CR) 12.5 MG 24 hr tablet Take 1 tablet (12.5 mg total) by mouth daily. 90 tablet 1  . traZODone (DESYREL) 50 MG tablet Take 0.5-1 tablets (25-50 mg total) by mouth at bedtime as needed for sleep. (Patient not taking: Reported on 08/07/2020) 30 tablet 3   No current facility-administered medications for this visit.    Review of Systems  Constitutional: Negative for chills, fatigue, fever and unexpected weight change.  HENT: Negative for trouble swallowing.  Eyes: Negative for loss of vision.  Respiratory: Negative for cough, shortness of breath and wheezing.  Cardiovascular: Negative for chest pain, leg swelling, palpitations and syncope.  GI: Negative for abdominal pain, blood in stool, diarrhea, nausea and vomiting.  GU: Negative for difficulty urinating, dysuria, frequency and hematuria.  Musculoskeletal: Negative for back pain, leg pain and joint pain.  Skin: Negative for rash.  Neurological: Negative for dizziness, headaches, light-headedness, numbness and seizures.  Psychiatric: Negative for behavioral problem, confusion, depressed mood and sleep disturbance.         Objective:  Objective   Vitals:   08/16/20 0948  BP: 110/80  Weight: 228 lb (103.4 kg)  Height: 5' (1.524 m)   Body mass index is 44.53 kg/m.  Physical Exam Vitals and nursing note reviewed. Exam conducted with a chaperone present.  Constitutional:      Appearance: Normal appearance.  HENT:     Head: Normocephalic and atraumatic.  Eyes:     Extraocular Movements: Extraocular movements intact.     Pupils: Pupils are equal, round, and reactive to light.  Cardiovascular:     Rate and Rhythm: Normal rate and regular rhythm.  Pulmonary:     Effort: Pulmonary effort is normal.     Breath sounds: Normal breath sounds.  Abdominal:     General: Abdomen is flat.     Palpations: Abdomen is soft.  Musculoskeletal:     Cervical back: Normal range of motion.  Skin:    General: Skin is warm and dry.  Neurological:     General: No focal deficit present.     Mental Status: She is alert and oriented to person, place, and time.  Psychiatric:        Behavior: Behavior normal.        Thought Content: Thought content normal.        Judgment: Judgment normal.     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 She desires to have her ovaries removed.   I have had a careful discussion with this patient about all the options available and the risk/benefits of each. I have fully informed this patient that a hysterectomy may subject her to a variety of discomforts and risks: She understands that most patients have surgery with little difficulty, but problems can happen ranging from minor to fatal. These include nausea, vomiting, pain, bleeding, infection, poor healing, hernia, wound separation, vaginal cuff separation or formation of adhesions. Unexpected reactions may occur from any drug or anesthetic given. Unintended injury may occur to other pelvic or abdominal structures such as Fallopian tubes, ovaries, bladder,  ureter (tube from kidney to bladder), or bowel. Nerves going from the pelvis to the legs may be injured. Any such injury may require immediate or later additional surgery to correct the problem. Excessive blood loss requiring transfusion is very unlikely but possible. Dangerous blood clots may form in the legs or lungs. Physical and sexual activity will be restricted in varying degrees for an indeterminate period of time but most often 2-4 weeks. She understands that the plan is to do this laparoscopically, however, there is a chance that this will need to be performed via a larger incision. She may be hospitalized overnight. Finally, she understands that it is impossible to list every possible undesirable effect and that the condition for which surgery is done  is not always cured or significantly improved, and in rare cases may be even worse. I have also counseled her extensively about the pros and cons of ovarian conservation versus removal. Ample time was given to answer all questions.  More than 30 minutes were spent face to face with the patient in the room, reviewing the medical record, labs and images, and coordinating care for the patient. The plan of management was discussed in detail and counseling was provided.      Adrian Prows MD [facility] 08/16/2020 9:49 AM

## 2020-08-16 NOTE — Progress Notes (Signed)
Patient ID: Maureen King, female   DOB: 1955/10/12, 65 y.o.   MRN: [id number]  Reason for Consult: Pre-op Exam   Referred by Valerie Roys, DO  Subjective:     HPI:  Maureen King is a 65 y.o. female. She reports she has not been having further vaginal bleeding. She is here for a preoperative visit. She has not new concerns. She reports that she would like to have her ovaries removed.   Prior D&C 05/30/2020: - FRAGMENTS OF BENIGN ENDOMETRIAL POLYP WITH ASSOCIATED NONATYPICAL  HYPERPLASIA.  - BACKGROUND PROLIFERATIVE APPEARING ENDOMETRIUM.  - NEGATIVE FOR ATYPICAL HYPERPLASIA/EIN AND MALIGNANCY.  Pap smear: 05/15/2020 NIL, HPV +  16, 18,45 negative   Past Medical History:  Diagnosis Date  . Anxiety   . Depression   . GERD (gastroesophageal reflux disease)   . Insomnia   . Obesity    Family History  Problem Relation Age of Onset  . Hypertension Mother   . Osteoporosis Mother   . Heart disease Father 64       Stent  . Lung disease Father   . Idiopathic pulmonary fibrosis Father   . Hypertension Brother   . Cancer Maternal Grandmother        gallbladder  . Heart disease Paternal Grandfather        MI  . Breast cancer Neg Hx    Past Surgical History:  Procedure Laterality Date  . COLONOSCOPY    . COLPOSCOPY N/A 05/30/2020   Procedure: COLPOSCOPY;  Surgeon: Homero Fellers, MD;  Location: [facility] ORS;  Service: Gynecology;  Laterality: N/A;  . HYSTEROSCOPY WITH D & C N/A 05/30/2020   Procedure: DILATATION AND CURETTAGE /HYSTEROSCOPY;  Surgeon: Homero Fellers, MD;  Location: [facility] ORS;  Service: Gynecology;  Laterality: N/A;  . NO PAST SURGERIES      Short Social History:  Social History   Tobacco Use  . Smoking status: Never Smoker  . Smokeless tobacco: Never Used  Substance Use Topics  . Alcohol use: Yes    Alcohol/week: 0.0 standard drinks    Comment: on occasion (4x/year)    Allergies  Allergen Reactions  . Penicillins Rash   . Neosporin [Neomycin-Polymyxin-Gramicidin] Rash    Current Outpatient Medications  Medication Sig Dispense Refill  . aspirin EC 81 MG tablet Take 81 mg by mouth daily. Swallow whole.    . B Complex-C (B-COMPLEX WITH VITAMIN C) tablet Take 1 tablet by mouth daily.    . Calcium Carb-Cholecalciferol (CALTRATE 600+D3 PO) Take 1 tablet by mouth daily.    . Cholecalciferol (VITAMIN D3) 50 MCG (2000 UT) TABS Take 2,000 Units by mouth daily.    Marland Kitchen HOMEOPATHIC PRODUCTS PO Take 1 capsule by mouth daily. doTerra On Guard+ Protective Essential Oil Blend    . ibuprofen (ADVIL) 600 MG tablet Take 1 tablet (600 mg total) by mouth every 6 (six) hours as needed. 60 tablet 0  . Magnesium 250 MG TABS Take 250 mg by mouth daily.    . Misc Natural Products (ELDERBERRY ZINC/VIT C/IMMUNE MT) Take 1 capsule by mouth daily.    Marland Kitchen PARoxetine (PAXIL-CR) 12.5 MG 24 hr tablet Take 1 tablet (12.5 mg total) by mouth daily. 90 tablet 1  . traZODone (DESYREL) 50 MG tablet Take 0.5-1 tablets (25-50 mg total) by mouth at bedtime as needed for sleep. (Patient not taking: Reported on 08/07/2020) 30 tablet 3   No current facility-administered medications for this visit.    Review of Systems  Constitutional: Negative for chills, fatigue, fever and unexpected weight change.  HENT: Negative for trouble swallowing.  Eyes: Negative for loss of vision.  Respiratory: Negative for cough, shortness of breath and wheezing.  Cardiovascular: Negative for chest pain, leg swelling, palpitations and syncope.  GI: Negative for abdominal pain, blood in stool, diarrhea, nausea and vomiting.  GU: Negative for difficulty urinating, dysuria, frequency and hematuria.  Musculoskeletal: Negative for back pain, leg pain and joint pain.  Skin: Negative for rash.  Neurological: Negative for dizziness, headaches, light-headedness, numbness and seizures.  Psychiatric: Negative for behavioral problem, confusion, depressed mood and sleep disturbance.         Objective:  Objective   Vitals:   08/16/20 0948  BP: 110/80  Weight: 228 lb (103.4 kg)  Height: 5' (1.524 m)   Body mass index is 44.53 kg/m.  Physical Exam Vitals and nursing note reviewed. Exam conducted with a chaperone present.  Constitutional:      Appearance: Normal appearance.  HENT:     Head: Normocephalic and atraumatic.  Eyes:     Extraocular Movements: Extraocular movements intact.     Pupils: Pupils are equal, round, and reactive to light.  Cardiovascular:     Rate and Rhythm: Normal rate and regular rhythm.  Pulmonary:     Effort: Pulmonary effort is normal.     Breath sounds: Normal breath sounds.  Abdominal:     General: Abdomen is flat.     Palpations: Abdomen is soft.  Musculoskeletal:     Cervical back: Normal range of motion.  Skin:    General: Skin is warm and dry.  Neurological:     General: No focal deficit present.     Mental Status: She is alert and oriented to person, place, and time.  Psychiatric:        Behavior: Behavior normal.        Thought Content: Thought content normal.        Judgment: Judgment normal.     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 She desires to have her ovaries removed.   I have had a careful discussion with this patient about all the options available and the risk/benefits of each. I have fully informed this patient that a hysterectomy may subject her to a variety of discomforts and risks: She understands that most patients have surgery with little difficulty, but problems can happen ranging from minor to fatal. These include nausea, vomiting, pain, bleeding, infection, poor healing, hernia, wound separation, vaginal cuff separation or formation of adhesions. Unexpected reactions may occur from any drug or anesthetic given. Unintended injury may occur to other pelvic or abdominal structures such as Fallopian tubes, ovaries, bladder,  ureter (tube from kidney to bladder), or bowel. Nerves going from the pelvis to the legs may be injured. Any such injury may require immediate or later additional surgery to correct the problem. Excessive blood loss requiring transfusion is very unlikely but possible. Dangerous blood clots may form in the legs or lungs. Physical and sexual activity will be restricted in varying degrees for an indeterminate period of time but most often 2-4 weeks. She understands that the plan is to do this laparoscopically, however, there is a chance that this will need to be performed via a larger incision. She may be hospitalized overnight. Finally, she understands that it is impossible to list every possible undesirable effect and that the condition for which surgery is done  is not always cured or significantly improved, and in rare cases may be even worse. I have also counseled her extensively about the pros and cons of ovarian conservation versus removal. Ample time was given to answer all questions.  More than 30 minutes were spent face to face with the patient in the room, reviewing the medical record, labs and images, and coordinating care for the patient. The plan of management was discussed in detail and counseling was provided.      Adrian Prows MD [facility], [facility] 08/16/2020 9:49 AM

## 2020-08-19 ENCOUNTER — Other Ambulatory Visit: Payer: Self-pay

## 2020-08-19 ENCOUNTER — Encounter
Admission: RE | Admit: 2020-08-19 | Discharge: 2020-08-19 | Disposition: A | Discharge status: Home / Self Care | Payer: Managed Care, Other (non HMO) | Source: Ambulatory Visit | Attending: Obstetrics and Gynecology | Admitting: Obstetrics and Gynecology

## 2020-08-19 HISTORY — DX: Palpitations: R00.2

## 2020-08-19 NOTE — Patient Instructions (Addendum)
Your procedure is scheduled on: 08/29/20 - THURSDAY Report to the Registration Desk on the 1st floor of the [facility]. To find out your arrival time, please call [phone] between 1PM - 3PM on: 08/28/20 - Wednesday REPORT TO [facility] ON 08/26/20 FOR LABS/EKG AND COVID TESTING/BAG.   REMEMBER: Instructions that are not followed completely may result in serious medical risk, up to and including death; or upon the discretion of your surgeon and anesthesiologist your surgery may need to be rescheduled.  Do not eat food after midnight the night before surgery.  No gum chewing, lozengers or hard candies.  You may however, drink CLEAR liquids up to 2 hours before you are scheduled to arrive for your surgery. Do not drink anything within 2 hours of your scheduled arrival time.  Clear liquids include: - water  - apple juice without pulp - gatorade (not RED, PURPLE, OR BLUE) - black coffee or tea (Do NOT add milk or creamers to the coffee or tea) Do NOT drink anything that is not on this lIST.  TAKE THESE MEDICATIONS THE MORNING OF SURGERY WITH A SIP OF WATER: - PARoxetine (PAXIL-CR) 12.5 MG 24 hr tableT   Follow recommendations from Cardiologist, Pulmonologist or PCP regarding stopping Aspirin, Coumadin, Plavix, Eliquis, Pradaxa, or Pletal. STOP TAKING 7 DAYS PRIOR TO SURGERY.  One week prior to surgery: STOP TAKING ON 08/21/20 Stop Anti-inflammatories (NSAIDS) such as Advil, Aleve, Ibuprofen, Motrin, Naproxen, Naprosyn and Aspirin based products such as Excedrin, Goodys Powder, BC Powder.  Stop ANY OVER THE COUNTER supplements until after surgery. STOP TAKING 08/21/20.  No Alcohol for 24 hours before or after surgery.  No Smoking including e-cigarettes for 24 hours prior to surgery.  No chewable tobacco products for at least 6 hours prior to surgery.  No nicotine patches on the day of surgery.  Do not use any "recreational" drugs for at least a week prior to your surgery.   Please be advised that the combination of cocaine and anesthesia may have negative outcomes, up to and including death. If you test positive for cocaine, your surgery will be cancelled.  On the morning of surgery brush your teeth with toothpaste and water, you may rinse your mouth with mouthwash if you wish. Do not swallow any toothpaste or mouthwash.  Do not wear jewelry, make-up, hairpins, clips or nail polish.  Do not wear lotions, powders, or perfumes.   Do not shave body from the neck down 48 hours prior to surgery just in case you cut yourself which could leave a site for infection.  Also, freshly shaved skin may become irritated if using the CHG soap.  Contact lenses, hearing aids and dentures may not be worn into surgery.  Do not bring valuables to the hospital. [facility] is not responsible for any missing/lost belongings or valuables.   Use CHG Soap or wipes as directed on instruction sheet.   Notify your doctor if there is any change in your medical condition (cold, fever, infection).  Wear comfortable clothing (specific to your surgery type) to the hospital.  Plan for stool softeners for home use; pain medications have a tendency to cause constipation. You can also help prevent constipation by eating foods high in fiber such as fruits and vegetables and drinking plenty of fluids as your diet allows.  After surgery, you can help prevent lung complications by doing breathing exercises.  Take deep breaths and cough every 1-2 hours. Your doctor may order a device called an Incentive  Spirometer to help you take deep breaths. When coughing or sneezing, hold a pillow firmly against your incision with both hands. This is called "splinting." Doing this helps protect your incision. It also decreases belly discomfort.  If you are being admitted to the hospital overnight, leave your suitcase in the car. After surgery it may be brought to your room.  If you are being discharged the  day of surgery, you will not be allowed to drive home. You will need a responsible adult (18 years or older) to drive you home and stay with you that night.   If you are taking public transportation, you will need to have a responsible adult (18 years or older) with you. Please confirm with your physician that it is acceptable to use public transportation.   Please call the [facility] at [phone] if you have any questions about these instructions.  Surgery Visitation Policy:  Patients undergoing a surgery or procedure may have one family member or support person with them as long as that person is not COVID-19 positive or experiencing its symptoms.  That person may remain in the waiting area during the procedure.  Inpatient Visitation:    Visiting hours are 7 a.m. to 8 p.m. Inpatients will be allowed two visitors daily. The visitors may change each day during the patient's stay. No visitors under the age of 12. Any visitor under the age of 39 must be accompanied by an adult. The visitor must pass COVID-19 screenings, use hand sanitizer when entering and exiting the patient's room and wear a mask at all times, including in the patient's room. Patients must also wear a mask when staff or their visitor are in the room. Masking is required regardless of vaccination status.

## 2020-08-23 NOTE — Progress Notes (Signed)
Perioperative Services Pre-Admission/Anesthesia Testing   Date: 08/23/20 Name: Maureen King MRN:   [id number]  Re: Consideration of preoperative prophylactic antibiotic change   Request sent to: No att. providers found (routed and/or faxed via [facility])  Planned Surgical Procedure(s):    Case: [id number] Date/Time: 08/29/20 7106   Procedures:      XI ROBOTIC ASSISTED LAPAROSCOPIC HYSTERECTOMY AND SALPINGECTOMY, BILATERAL OOPHORECTOMY (Bilateral )     CYSTOSCOPY (N/A )   Anesthesia type: Choice   Pre-op diagnosis: Endometrial hyperplasia without atypia   Location: [facility] OR ROOM 06 / [place] [facility]   Surgeons: Homero Fellers, MD    Notes: 1. Patient has a documented allergy to PCN  . Advising that PCN has caused her to experience a mild/moderate rash in the past.   2. Screened as appropriate for cephalosporin use during medication reconciliation . No immediate angioedema, dysphagia, SOB, anaphylaxis symptoms. . No severe rash involving mucous membranes or skin necrosis. . No hospital admissions related to side effects of PCN/cephalosporin use.  . No documented reaction to PCN or cephalosporin in the last 10 years.  Request:  As an evidence based approach to reducing the rate of incidence for post-operative SSI and the development of MDROs, could an agent with narrower coverage for preoperative prophylaxis in this patient's upcoming surgical course be considered?   1. Currently ordered preoperative prophylactic ABX: clindamycin + cipro.   2. Specifically requesting change to cephalosporin (CEFAZOLIN).   3. Please communicate decision with me and I will change the orders in Epic as per your direction.   Things to consider:  Many patients report that they were "allergic" to PCN earlier in life, however this does not translate into a true lifelong allergy. Patients can lose sensitivity to specific IgE antibodies over time if PCN is avoided (Kleris & Lugar,  2019).   Up to 10% of the adult population and 15% of hospitalized patients report an allergy to PCN, however clinical studies suggest that 90% of those reporting an allergy can tolerate PCN antibiotics (Kleris & Lugar, 2019).   Cross-sensitivity between PCN and cephalosporins has been documented as being as high as 10%, however this estimation included data believed to have been collected in a setting where there was contamination. Newer data suggests that the prevalence of cross-sensitivity between PCN and cephalosporins is actually estimated to be closer to 1% (Hermanides et al., 2018).    Patients labeled as PCN allergic, whether they are truly allergic or not, have been found to have inferior outcomes in terms of rates of serious infection, and these patients tend to have longer hospital stays ([place], 2019).   Treatment related secondary infections, such as Clostridioides difficile, have been linked to the improper use of broad spectrum antibiotics in patients improperly labeled as PCN allergic (Kleris & Lugar, 2019).   Anaphylaxis from cephalosporins is rare and the evidence suggests that there is no increased risk of an anaphylactic type reaction when cephalosporins are used in a PCN allergic patient (Pichichero, 2006).  Citations: Hermanides J, Lemkes BA, Prins Pearla Dubonnet MW, Terreehorst I. Presumed ?-Lactam Allergy and Cross-reactivity in the Operating Theater: A Practical Approach. Anesthesiology. 2018 Aug;129(2):335-342. doi: 10.1097/ALN.0000000000002252. PMID: 26948546.  Kleris, [place]., & Lugar, P. L. (2019). Things We Do For No Reason: Failing to Question a Penicillin Allergy History. Journal of hospital medicine, 14(10), [phone]. Advance online publication. https://www.wallace-middleton.info/  Pichichero, M. E. (2006). Cephalosporins can be prescribed safely for penicillin-allergic patients. Journal of family medicine,  55(2), 106-112. Accessed:  https://cdn.mdedge.com/files/s55fs-public/Document/September-2017/5502JFP_AppliedEvidence1.pdf   Honor Loh, MSN, APRN, FNP-C, CEN [facility]  Peri-operative Services Nurse Practitioner FAX: [phone] 08/23/20 5:24 PM

## 2020-08-26 ENCOUNTER — Other Ambulatory Visit: Payer: Self-pay

## 2020-08-26 ENCOUNTER — Encounter
Admission: RE | Admit: 2020-08-26 | Discharge: 2020-08-26 | Disposition: A | Discharge status: Home / Self Care | Payer: Managed Care, Other (non HMO) | Source: Ambulatory Visit | Attending: Obstetrics and Gynecology | Admitting: Obstetrics and Gynecology

## 2020-08-26 DIAGNOSIS — Z20822 Contact with and (suspected) exposure to covid-19: Secondary | ICD-10-CM | POA: Insufficient documentation

## 2020-08-26 DIAGNOSIS — Z01812 Encounter for preprocedural laboratory examination: Secondary | ICD-10-CM | POA: Diagnosis not present

## 2020-08-26 LAB — BASIC METABOLIC PANEL
Anion gap: 10 (ref 5–15)
BUN: 19 mg/dL (ref 8–23)
CO2: 26 mmol/L (ref 22–32)
Calcium: 9.4 mg/dL (ref 8.9–10.3)
Chloride: 103 mmol/L (ref 98–111)
Creatinine, Ser: 0.63 mg/dL (ref 0.44–1.00)
GFR, Estimated: >60 mL/min (ref >60)
Glucose, Bld: 88 mg/dL (ref 70–99)
Potassium: 3.7 mmol/L (ref 3.5–5.1)
Sodium: 139 mmol/L (ref 135–145)

## 2020-08-26 LAB — CBC
HCT: 42.6 % (ref 36.0–46.0)
Hemoglobin: 14.5 g/dL (ref 12.0–15.0)
MCH: 29.9 pg (ref 26.0–34.0)
MCHC: 34 g/dL (ref 30.0–36.0)
MCV: 87.8 fL (ref 80.0–100.0)
Platelets: 298 10*3/uL (ref 150–400)
RBC: 4.85 MIL/uL (ref 3.87–5.11)
RDW: 12.6 % (ref 11.5–15.5)
WBC: 6.1 10*3/uL (ref 4.0–10.5)
nRBC: 0 % (ref 0.0–0.2)

## 2020-08-26 LAB — TYPE AND SCREEN
ABO/RH(D): O POS
Antibody Screen: NEGATIVE

## 2020-08-27 ENCOUNTER — Other Ambulatory Visit: Payer: Managed Care, Other (non HMO)

## 2020-08-27 LAB — SARS CORONAVIRUS 2 (TAT 6-24 HRS): SARS Coronavirus 2: NEGATIVE

## 2020-08-28 MED ORDER — POVIDONE-IODINE 10 % EX SWAB
2.0000 "application " | Freq: Once | CUTANEOUS | Status: AC
  Administered 2020-08-29: 2 via TOPICAL

## 2020-08-28 MED ORDER — LACTATED RINGERS IV SOLN: INTRAVENOUS | Status: DC

## 2020-08-28 MED ORDER — CIPROFLOXACIN IN D5W 400 MG/200ML IV SOLN
400.0000 mg | INTRAVENOUS | Status: AC
  Administered 2020-08-29: 400 mg via INTRAVENOUS

## 2020-08-28 MED ORDER — CHLORHEXIDINE GLUCONATE 0.12 % MT SOLN
15.0000 mL | Freq: Once | OROMUCOSAL | Status: AC
  Administered 2020-08-29: 15 mL via OROMUCOSAL

## 2020-08-28 MED ORDER — CLINDAMYCIN PHOSPHATE 900 MG/50ML IV SOLN
900.0000 mg | INTRAVENOUS | Status: AC
  Administered 2020-08-29: 900 mg via INTRAVENOUS

## 2020-08-28 MED ORDER — FAMOTIDINE 20 MG PO TABS
20.0000 mg | ORAL_TABLET | Freq: Once | ORAL | Status: AC
  Administered 2020-08-29: 20 mg via ORAL

## 2020-08-28 MED ORDER — ORAL CARE MOUTH RINSE: 15.0000 mL | Freq: Once | OROMUCOSAL | Status: AC

## 2020-08-29 ENCOUNTER — Ambulatory Visit: Payer: Managed Care, Other (non HMO) | Admitting: Certified Registered Nurse Anesthetist

## 2020-08-29 ENCOUNTER — Other Ambulatory Visit: Payer: Self-pay

## 2020-08-29 ENCOUNTER — Encounter: Payer: Self-pay | Admitting: Obstetrics and Gynecology

## 2020-08-29 ENCOUNTER — Ambulatory Visit
Admission: RE | Admit: 2020-08-29 | Discharge: 2020-08-29 | Disposition: A | Discharge status: Home / Self Care | Payer: Managed Care, Other (non HMO) | Attending: Obstetrics and Gynecology | Admitting: Obstetrics and Gynecology

## 2020-08-29 ENCOUNTER — Encounter
Admission: RE | Disposition: A | Discharge status: Home / Self Care | Payer: Self-pay | Source: Home / Self Care | Attending: Obstetrics and Gynecology

## 2020-08-29 DIAGNOSIS — Z880 Allergy status to penicillin: Secondary | ICD-10-CM | POA: Diagnosis not present

## 2020-08-29 DIAGNOSIS — N800 Endometriosis of uterus: Secondary | ICD-10-CM | POA: Insufficient documentation

## 2020-08-29 DIAGNOSIS — D259 Leiomyoma of uterus, unspecified: Secondary | ICD-10-CM

## 2020-08-29 DIAGNOSIS — Z79899 Other long term (current) drug therapy: Secondary | ICD-10-CM | POA: Diagnosis not present

## 2020-08-29 DIAGNOSIS — N8500 Endometrial hyperplasia, unspecified: Secondary | ICD-10-CM | POA: Diagnosis not present

## 2020-08-29 DIAGNOSIS — Z7982 Long term (current) use of aspirin: Secondary | ICD-10-CM | POA: Diagnosis not present

## 2020-08-29 DIAGNOSIS — N950 Postmenopausal bleeding: Secondary | ICD-10-CM | POA: Diagnosis not present

## 2020-08-29 DIAGNOSIS — N838 Other noninflammatory disorders of ovary, fallopian tube and broad ligament: Secondary | ICD-10-CM | POA: Diagnosis not present

## 2020-08-29 HISTORY — PX: CYSTOSCOPY: SHX5120

## 2020-08-29 HISTORY — PX: ROBOTIC ASSISTED LAPAROSCOPIC HYSTERECTOMY AND SALPINGECTOMY: SHX6379

## 2020-08-29 SURGERY — XI ROBOTIC ASSISTED LAPAROSCOPIC HYSTERECTOMY AND SALPINGECTOMY
Anesthesia: General

## 2020-08-29 MED ORDER — ROCURONIUM BROMIDE 100 MG/10ML IV SOLN
INTRAVENOUS | Status: DC | PRN
  Administered 2020-08-29: 60 mg via INTRAVENOUS
  Administered 2020-08-29: 5 mg via INTRAVENOUS

## 2020-08-29 MED ORDER — FENTANYL CITRATE (PF) 100 MCG/2ML IJ SOLN: 25.0000 ug | INTRAMUSCULAR | Status: DC | PRN

## 2020-08-29 MED ORDER — FENTANYL CITRATE (PF) 100 MCG/2ML IJ SOLN
INTRAMUSCULAR | Status: AC
  Administered 2020-08-29: 25 ug via INTRAVENOUS
  Filled 2020-08-29: qty 2

## 2020-08-29 MED ORDER — CLINDAMYCIN PHOSPHATE 900 MG/50ML IV SOLN
INTRAVENOUS | Status: AC
  Filled 2020-08-29: qty 50

## 2020-08-29 MED ORDER — BUPIVACAINE LIPOSOME 1.3 % IJ SUSP
INTRAMUSCULAR | Status: DC | PRN
  Administered 2020-08-29: 20 mL

## 2020-08-29 MED ORDER — IBUPROFEN 600 MG PO TABS: 600.0000 mg | ORAL_TABLET | Freq: Four times a day (QID) | ORAL | 0 refills | Status: DC | PRN

## 2020-08-29 MED ORDER — DEXAMETHASONE SODIUM PHOSPHATE 10 MG/ML IJ SOLN
INTRAMUSCULAR | Status: DC | PRN
  Administered 2020-08-29: 10 mg via INTRAVENOUS

## 2020-08-29 MED ORDER — FENTANYL CITRATE (PF) 100 MCG/2ML IJ SOLN
INTRAMUSCULAR | Status: DC | PRN
  Administered 2020-08-29: 100 ug via INTRAVENOUS

## 2020-08-29 MED ORDER — KETOROLAC TROMETHAMINE 30 MG/ML IJ SOLN
INTRAMUSCULAR | Status: DC | PRN
  Administered 2020-08-29: 30 mg via INTRAVENOUS

## 2020-08-29 MED ORDER — ACETAMINOPHEN 500 MG PO TABS
500.0000 mg | ORAL_TABLET | Freq: Four times a day (QID) | ORAL | 0 refills | Status: DC
Start: 2020-08-29 — End: 2021-01-07

## 2020-08-29 MED ORDER — ACETAMINOPHEN 10 MG/ML IV SOLN
INTRAVENOUS | Status: DC | PRN
  Administered 2020-08-29: 1000 mg via INTRAVENOUS

## 2020-08-29 MED ORDER — BUPIVACAINE LIPOSOME 1.3 % IJ SUSP
INTRAMUSCULAR | Status: AC
  Filled 2020-08-29: qty 20

## 2020-08-29 MED ORDER — PHENYLEPHRINE HCL (PRESSORS) 10 MG/ML IV SOLN
INTRAVENOUS | Status: DC | PRN
  Administered 2020-08-29: 50 ug via INTRAVENOUS
  Administered 2020-08-29: 100 ug via INTRAVENOUS
  Administered 2020-08-29: 50 ug via INTRAVENOUS
  Administered 2020-08-29: 200 ug via INTRAVENOUS

## 2020-08-29 MED ORDER — CHLORHEXIDINE GLUCONATE 0.12 % MT SOLN
OROMUCOSAL | Status: AC
  Filled 2020-08-29: qty 15

## 2020-08-29 MED ORDER — MIDAZOLAM HCL 2 MG/2ML IJ SOLN
INTRAMUSCULAR | Status: AC
  Filled 2020-08-29: qty 2

## 2020-08-29 MED ORDER — CIPROFLOXACIN IN D5W 400 MG/200ML IV SOLN
INTRAVENOUS | Status: AC
  Filled 2020-08-29: qty 200

## 2020-08-29 MED ORDER — DOCUSATE SODIUM 100 MG PO CAPS: 100.0000 mg | ORAL_CAPSULE | Freq: Two times a day (BID) | ORAL | 0 refills | Status: DC | PRN

## 2020-08-29 MED ORDER — EPHEDRINE SULFATE 50 MG/ML IJ SOLN
INTRAMUSCULAR | Status: DC | PRN
  Administered 2020-08-29: 5 mg via INTRAVENOUS

## 2020-08-29 MED ORDER — ONDANSETRON HCL 4 MG/2ML IJ SOLN
INTRAMUSCULAR | Status: DC | PRN
  Administered 2020-08-29: 4 mg via INTRAVENOUS

## 2020-08-29 MED ORDER — LACTATED RINGERS IV SOLN: INTRAVENOUS | Status: DC | PRN

## 2020-08-29 MED ORDER — ONDANSETRON HCL 4 MG/2ML IJ SOLN: 4.0000 mg | Freq: Once | INTRAMUSCULAR | Status: DC | PRN

## 2020-08-29 MED ORDER — HYDROMORPHONE HCL 1 MG/ML IJ SOLN
INTRAMUSCULAR | Status: DC | PRN
  Administered 2020-08-29: .2 mg via INTRAVENOUS
  Administered 2020-08-29: .4 mg via INTRAVENOUS

## 2020-08-29 MED ORDER — PROPOFOL 10 MG/ML IV BOLUS
INTRAVENOUS | Status: DC | PRN
  Administered 2020-08-29: 150 mg via INTRAVENOUS

## 2020-08-29 MED ORDER — MIDAZOLAM HCL 2 MG/2ML IJ SOLN
INTRAMUSCULAR | Status: DC | PRN
  Administered 2020-08-29: 2 mg via INTRAVENOUS

## 2020-08-29 MED ORDER — LIDOCAINE HCL (CARDIAC) PF 100 MG/5ML IV SOSY
PREFILLED_SYRINGE | INTRAVENOUS | Status: DC | PRN
  Administered 2020-08-29: 100 mg via INTRAVENOUS

## 2020-08-29 MED ORDER — SUGAMMADEX SODIUM 200 MG/2ML IV SOLN
INTRAVENOUS | Status: DC | PRN
  Administered 2020-08-29: 206.8 mg via INTRAVENOUS

## 2020-08-29 MED ORDER — OXYCODONE HCL 5 MG PO TABS: 5.0000 mg | ORAL_TABLET | Freq: Four times a day (QID) | ORAL | 0 refills | Status: DC | PRN

## 2020-08-29 MED ORDER — FENTANYL CITRATE (PF) 100 MCG/2ML IJ SOLN
INTRAMUSCULAR | Status: AC
  Filled 2020-08-29: qty 2

## 2020-08-29 MED ORDER — ACETAMINOPHEN 10 MG/ML IV SOLN
INTRAVENOUS | Status: AC
  Filled 2020-08-29: qty 100

## 2020-08-29 MED ORDER — HYDROMORPHONE HCL 1 MG/ML IJ SOLN
INTRAMUSCULAR | Status: AC
  Filled 2020-08-29: qty 1

## 2020-08-29 MED ORDER — FAMOTIDINE 20 MG PO TABS
ORAL_TABLET | ORAL | Status: AC
  Filled 2020-08-29: qty 1

## 2020-08-29 SURGICAL SUPPLY — 91 items
APPLICATOR ARISTA FLEXITIP XL (MISCELLANEOUS) ×3 IMPLANT
BAG LAPAROSCOPIC 12 15 PORT 16 (BASKET) IMPLANT
BAG RETRIEVAL 12/15 (BASKET)
BAG URINE DRAIN 2000ML AR STRL (UROLOGICAL SUPPLIES) ×3 IMPLANT
BASIN GRAD PLASTIC 32OZ STRL (MISCELLANEOUS) ×3 IMPLANT
BLADE SURG 11 STRL SS SAFETY (MISCELLANEOUS) IMPLANT
BLADE SURG SZ10 CARB STEEL (BLADE) ×3 IMPLANT
BLADE SURG SZ11 CARB STEEL (BLADE) ×3 IMPLANT
CANNULA CAP OBTURATR AIRSEAL 8 (CAP) ×3 IMPLANT
CANNULA REDUC XI 12-8 STAPL (CANNULA) ×1
CANNULA REDUCER 12-8 DVNC XI (CANNULA) ×2 IMPLANT
CATH FOLEY 2WAY  5CC 16FR (CATHETERS) ×1
CATH URTH 16FR FL 2W BLN LF (CATHETERS) ×2 IMPLANT
CHLORAPREP W/TINT 26 (MISCELLANEOUS) ×3 IMPLANT
COVER BACK TABLE REUSABLE LG (DRAPES) IMPLANT
COVER MAYO STAND REUSABLE (DRAPES) ×3 IMPLANT
COVER TIP SHEARS 8 DVNC (MISCELLANEOUS) ×2 IMPLANT
COVER TIP SHEARS 8MM DA VINCI (MISCELLANEOUS) ×1
COVER WAND RF STERILE (DRAPES) ×6 IMPLANT
DEFOGGER SCOPE WARMER CLEARIFY (MISCELLANEOUS) ×3 IMPLANT
DERMABOND ADVANCED (GAUZE/BANDAGES/DRESSINGS) ×1
DERMABOND ADVANCED .7 DNX12 (GAUZE/BANDAGES/DRESSINGS) ×2 IMPLANT
DRAPE 3/4 80X56 (DRAPES) ×6 IMPLANT
DRAPE ARM DVNC X/XI (DISPOSABLE) ×6 IMPLANT
DRAPE COLUMN DVNC XI (DISPOSABLE) ×2 IMPLANT
DRAPE DA VINCI XI ARM (DISPOSABLE) ×3
DRAPE DA VINCI XI COLUMN (DISPOSABLE) ×1
DRAPE UNDER BUTTOCK W/FLU (DRAPES) ×3 IMPLANT
DRSG TEGADERM 2-3/8X2-3/4 SM (GAUZE/BANDAGES/DRESSINGS) ×12 IMPLANT
ELECT REM PT RETURN 9FT ADLT (ELECTROSURGICAL) ×3
ELECTRODE REM PT RTRN 9FT ADLT (ELECTROSURGICAL) ×2 IMPLANT
EXTRT SYSTEM ALEXIS 17CM (MISCELLANEOUS)
GAUZE 4X4 16PLY RFD (DISPOSABLE) ×3 IMPLANT
GLOVE SURG ENC MOIS LTX SZ7 (GLOVE) ×6 IMPLANT
GLOVE SURG SYN 6.5 ES PF (GLOVE) ×6 IMPLANT
GLOVE SURG UNDER POLY LF SZ6.5 (GLOVE) ×3 IMPLANT
GLOVE SURG UNDER POLY LF SZ7.5 (GLOVE) ×6 IMPLANT
GOWN STRL REUS W/ TWL LRG LVL3 (GOWN DISPOSABLE) ×16 IMPLANT
GOWN STRL REUS W/TWL LRG LVL3 (GOWN DISPOSABLE) ×8
GRASPER SUT TROCAR 14GX15 (MISCELLANEOUS) ×3 IMPLANT
HEMOSTAT ARISTA ABSORB 3G PWDR (HEMOSTASIS) ×3 IMPLANT
IRRIGATION STRYKERFLOW (MISCELLANEOUS) IMPLANT
IRRIGATOR STRYKERFLOW (MISCELLANEOUS)
IRRIGATOR SUCT 8 DISP DVNC XI (IRRIGATION / IRRIGATOR) ×2 IMPLANT
IRRIGATOR SUCTION 8MM XI DISP (IRRIGATION / IRRIGATOR) ×1
IV NS 1000ML (IV SOLUTION) ×1
IV NS 1000ML BAXH (IV SOLUTION) ×2 IMPLANT
KIT PINK PAD W/HEAD ARE REST (MISCELLANEOUS) ×3
KIT PINK PAD W/HEAD ARM REST (MISCELLANEOUS) ×2 IMPLANT
LABEL OR SOLS (LABEL) ×3 IMPLANT
MANIFOLD NEPTUNE II (INSTRUMENTS) ×3 IMPLANT
MANIPULATOR VCARE LG CRV RETR (MISCELLANEOUS) IMPLANT
MANIPULATOR VCARE SML CRV RETR (MISCELLANEOUS) ×3 IMPLANT
MANIPULATOR VCARE STD CRV RETR (MISCELLANEOUS) IMPLANT
NEEDLE HYPO 22GX1.5 SAFETY (NEEDLE) ×3 IMPLANT
NS IRRIG 1000ML POUR BTL (IV SOLUTION) ×3 IMPLANT
OBTURATOR OPTICAL STANDARD 8MM (TROCAR) ×1
OBTURATOR OPTICAL STND 8 DVNC (TROCAR) ×2
OBTURATOR OPTICALSTD 8 DVNC (TROCAR) ×2 IMPLANT
OCCLUDER COLPOPNEUMO (BALLOONS) ×3 IMPLANT
PACK GYN LAPAROSCOPIC (MISCELLANEOUS) ×3 IMPLANT
PAD ARMBOARD 7.5X6 YLW CONV (MISCELLANEOUS) IMPLANT
PAD OB MATERNITY 4.3X12.25 (PERSONAL CARE ITEMS) IMPLANT
PAD PREP 24X41 OB/GYN DISP (PERSONAL CARE ITEMS) ×3 IMPLANT
RETRACTOR WOUND ALXS 18CM SML (MISCELLANEOUS) IMPLANT
RTRCTR WOUND ALEXIS O 18CM SML (MISCELLANEOUS)
SEAL CANN UNIV 5-8 DVNC XI (MISCELLANEOUS) ×6 IMPLANT
SEAL XI 5MM-8MM UNIVERSAL (MISCELLANEOUS) ×3
SEALER VESSEL DA VINCI XI (MISCELLANEOUS) ×1
SEALER VESSEL EXT DVNC XI (MISCELLANEOUS) ×2 IMPLANT
SET CYSTO W/LG BORE CLAMP LF (SET/KITS/TRAYS/PACK) ×3 IMPLANT
SET TRI-LUMEN FLTR TB AIRSEAL (TUBING) ×3 IMPLANT
SET TUBE FILTERED XL AIRSEAL (SET/KITS/TRAYS/PACK) ×3 IMPLANT
SOLUTION ELECTROLUBE (MISCELLANEOUS) ×3 IMPLANT
SPONGE GAUZE 2X2 8PLY STRL LF (GAUZE/BANDAGES/DRESSINGS) ×12 IMPLANT
SPONGE LAP 18X18 RF (DISPOSABLE) ×3 IMPLANT
STAPLER CANNULA SEAL DVNC XI (STAPLE) ×2 IMPLANT
STAPLER CANNULA SEAL XI (STAPLE) ×1
SURGILUBE 2OZ TUBE FLIPTOP (MISCELLANEOUS) ×3 IMPLANT
SUT DVC VLOC 180 0 12IN GS21 (SUTURE) ×3
SUT MNCRL 4-0 (SUTURE) ×1
SUT MNCRL 4-0 27XMFL (SUTURE) ×2
SUT VIC AB 0 CT1 36 (SUTURE) ×3 IMPLANT
SUT VLOC 180 0 6IN GS21 (SUTURE) ×3 IMPLANT
SUTURE DVC VLC 180 0 12IN GS21 (SUTURE) ×2 IMPLANT
SUTURE MNCRL 4-0 27XMF (SUTURE) ×2 IMPLANT
SYR 10ML LL (SYRINGE) ×3 IMPLANT
SYR 50ML LL SCALE MARK (SYRINGE) ×3 IMPLANT
SYSTEM CONTND EXTRCTN KII BLLN (MISCELLANEOUS) IMPLANT
TROCAR PORT AIRSEAL 5X120 (TROCAR) ×3 IMPLANT
TROCAR PORT AIRSEAL 8X100 (TROCAR) ×3 IMPLANT

## 2020-08-29 NOTE — Transfer of Care (Signed)
Immediate Anesthesia Transfer of Care Note  Patient: [name]  Procedure(s) Performed: XI ROBOTIC ASSISTED LAPAROSCOPIC HYSTERECTOMY AND SALPINGECTOMY, BILATERAL OOPHORECTOMY (Bilateral ) CYSTOSCOPY (N/A )  Patient Location: PACU  Anesthesia Type:General  Level of Consciousness: awake, alert  and oriented  Airway & Oxygen Therapy: Patient Spontanous Breathing and Patient connected to face mask oxygen  Post-op Assessment: Report given to RN and Post -op Vital signs reviewed and stable  Post vital signs: Reviewed and stable  Last Vitals:  Vitals Value Taken Time  BP 148/92 08/29/20 1224  Temp    Pulse 93 08/29/20 1226  Resp 20 08/29/20 1226  SpO2 100 % 08/29/20 1226  Vitals shown include unvalidated device data.  Last Pain:  Vitals:   08/29/20 0814  TempSrc: Tympanic  PainSc: 0-No pain         Complications: No complications documented.

## 2020-08-29 NOTE — Interval H&P Note (Signed)
History and Physical Interval Note:  08/29/2020 8:57 AM  Maureen King  has presented today for surgery, with the diagnosis of Endometrial hyperplasia without atypia.  The various methods of treatment have been discussed with the patient and family. After consideration of risks, benefits and other options for treatment, the patient has consented to  Procedure(s): XI ROBOTIC ASSISTED LAPAROSCOPIC HYSTERECTOMY AND SALPINGECTOMY, BILATERAL OOPHORECTOMY (Bilateral) CYSTOSCOPY (N/A) as a surgical intervention.  The patient's history has been reviewed, patient examined, no change in status, stable for surgery.  I have reviewed the patient's chart and labs.  Questions were answered to the patient's satisfaction.     [place]

## 2020-08-29 NOTE — Anesthesia Preprocedure Evaluation (Signed)
Anesthesia Evaluation  Patient identified by MRN, date of birth, ID band Patient awake    Reviewed: Allergy & Precautions, H&P , NPO status , Patient's Chart, lab work & pertinent test results, reviewed documented beta blocker date and time   History of Anesthesia Complications Negative for: history of anesthetic complications  Airway Mallampati: III  TM Distance: >3 FB Neck ROM: full  Mouth opening: Limited Mouth Opening  Dental  (+) Dental Advidsory Given, Teeth Intact, Caps   Pulmonary neg pulmonary ROS,    Pulmonary exam normal breath sounds clear to auscultation       Cardiovascular Exercise Tolerance: Good negative cardio ROS Normal cardiovascular exam Rhythm:regular Rate:Normal     Neuro/Psych PSYCHIATRIC DISORDERS Anxiety Depression negative neurological ROS     GI/Hepatic Neg liver ROS, GERD  Controlled,  Endo/Other  neg diabetesMorbid obesity  Renal/GU negative Renal ROS  negative genitourinary   Musculoskeletal   Abdominal   Peds negative pediatric ROS (+)  Hematology negative hematology ROS (+)   Anesthesia Other Findings Past Medical History: No date: Anxiety No date: Depression No date: GERD (gastroesophageal reflux disease) No date: Insomnia No date: Obesity   Reproductive/Obstetrics negative OB ROS                             Anesthesia Physical  Anesthesia Plan  ASA: III  Anesthesia Plan: General   Post-op Pain Management:    Induction: Intravenous  PONV Risk Score and Plan: 3 and Ondansetron, Dexamethasone, Midazolam, Treatment may vary due to age or medical condition and Promethazine  Airway Management Planned: Oral ETT and Video Laryngoscope Planned  Additional Equipment:   Intra-op Plan:   Post-operative Plan: Extubation in OR  Informed Consent: I have reviewed the patients History and Physical, chart, labs and discussed the procedure including  the risks, benefits and alternatives for the proposed anesthesia with the patient or authorized representative who has indicated his/her understanding and acceptance.     Dental Advisory Given  Plan Discussed with: Anesthesiologist, CRNA and Surgeon  Anesthesia Plan Comments:         Anesthesia Quick Evaluation

## 2020-08-29 NOTE — Anesthesia Procedure Notes (Signed)
Procedure Name: Intubation Date/Time: 08/29/2020 9:43 AM Performed by: Willette Alma, CRNA Pre-anesthesia Checklist: Patient identified, Patient being monitored, Timeout performed, Emergency Drugs available and Suction available Patient Re-evaluated:Patient Re-evaluated prior to induction Oxygen Delivery Method: Circle system utilized Preoxygenation: Pre-oxygenation with 100% oxygen Induction Type: IV induction Ventilation: Mask ventilation without difficulty Laryngoscope Size: 4 and McGraph Grade View: Grade I Tube type: Oral Tube size: 7.0 mm Number of attempts: 1 Airway Equipment and Method: Stylet Placement Confirmation: ETT inserted through vocal cords under direct vision,  positive ETCO2 and breath sounds checked- equal and bilateral Secured at: 21 cm Tube secured with: Tape Dental Injury: Teeth and Oropharynx as per pre-operative assessment

## 2020-08-29 NOTE — Discharge Instructions (Addendum)
Bupivacaine Liposomal Suspension for Injection What is this medicine? BUPIVACAINE LIPOSOMAL (bue PIV a kane LIP oh som al) is an anesthetic. It causes loss of feeling in the skin or other tissues. It is used to prevent and to treat pain from some procedures. This medicine may be used for other purposes; ask your health care provider or pharmacist if you have questions. COMMON BRAND NAME(S): EXPAREL What should I tell my health care provider before I take this medicine? They need to know if you have any of these conditions:  G6PD deficiency  heart disease  kidney disease  liver disease  low blood pressure  lung or breathing disease, like asthma  an unusual or allergic reaction to bupivacaine, other medicines, foods, dyes, or preservatives  pregnant or trying to get pregnant  breast-feeding How should I use this medicine? This medicine is injected into the affected area. It is given by a health care provider in a hospital or clinic setting. Talk to your health care provider about the use of this medicine in children. While it may be given to children as young as 6 years for selected conditions, precautions do apply. Overdosage: If you think you have taken too much of this medicine contact a poison control center or emergency room at once. NOTE: This medicine is only for you. Do not share this medicine with others. What if I miss a dose? This does not apply. What may interact with this medicine? This medicine may interact with the following medications:   certain antibiotics like dapsone, nitrofurantoin, aminosalicylic acid, sulfonamides  certain medicines for seizures like phenobarbital, phenytoin, valproic acid  chloroquine  cyclophosphamide  flutamide  hydroxyurea  ifosfamide  metoclopramide  nitric oxide  nitroglycerin  nitroprusside  nitrous oxide  other local anesthetics like lidocaine, pramoxine,  tetracaine  primaquine  quinine  rasburicase  sulfasalazine This list may not describe all possible interactions. Give your health care provider a list of all the medicines, herbs, non-prescription drugs, or dietary supplements you use. Also tell them if you smoke, drink alcohol, or use illegal drugs. Some items may interact with your medicine. What should I watch for while using this medicine? Your condition will be monitored carefully while you are receiving this medicine. Be careful to avoid injury while the area is numb, and you are not aware of pain. What side effects may I notice from receiving this medicine? Side effects that you should report to your doctor or health care professional as soon as possible:  allergic reactions like skin rash, itching or hives, swelling of the face, lips, or tongue  seizures  signs and symptoms of a dangerous change in heartbeat or heart rhythm like chest pain; dizziness; fast, irregular heartbeat; palpitations; feeling faint or lightheaded; falls; breathing problems  signs and symptoms of methemoglobinemia such as pale, gray, or blue colored skin; headache; fast heartbeat; shortness of breath; feeling faint or lightheaded, falls; tiredness Side effects that usually do not require medical attention (report to your doctor or health care professional if they continue or are bothersome):  anxious  back pain  changes in taste  changes in vision  constipation  dizziness  fever  nausea, vomiting This list may not describe all possible side effects. Call your doctor for medical advice about side effects. You may report side effects to FDA at 1-800-FDA-1088. Where should I keep my medicine? This drug is given in a hospital or clinic and will not be stored at home. NOTE: This sheet is a summary.  It may not cover all possible information. If you have questions about this medicine, talk to your doctor, pharmacist, or health care provider.  2021  Elsevier/Gold Standard (2019-08-24 12:24:57)   Laparoscopically Assisted Vaginal Hysterectomy, Care After The following information offers guidance on how to care for yourself after your procedure. Your health care provider may also give you more specific instructions. If you have problems or questions, contact your health care provider. What can I expect after the procedure? After the procedure, it is common to have:  Soreness and numbness in your incision areas.  Abdominal pain. You will be given pain medicine to control it.  Vaginal bleeding and discharge. You will need to use a sanitary pad after this procedure.  Tiredness (fatigue).  Poor appetite.  Less interest in sex.  Feelings of sadness or other emotions. If your ovaries were also removed, it is common to have symptoms of menopause, such as hot flashes, night sweats, and lack of sleep (insomnia). Follow these instructions at home: Medicines  Take over-the-counter and prescription medicines only as told by your health care provider.  Do not take aspirin or NSAIDs, such as ibuprofen. These medicines can cause bleeding.  Ask your health care provider if the medicine prescribed to you: ? Requires you to avoid driving or using heavy machinery. ? Can cause constipation. You may need to take these actions to prevent or treat constipation:  Drink enough fluid to keep your urine pale yellow.  Take over-the-counter or prescription medicines.  Eat foods that are high in fiber, such as beans, whole grains, and fresh fruits and vegetables.  Limit foods that are high in fat and processed sugars, such as fried or sweet foods. Incision care  Follow instructions from your health care provider about how to take care of your incisions. Make sure you: ? Wash your hands with soap and water for at least 20 seconds before and after you change your bandage (dressing). If soap and water are not available, use hand sanitizer. ? Change  your dressing as told by your health care provider. ? Leave stitches (sutures), skin glue, or adhesive strips in place. These skin closures may need to stay in place for 2 weeks or longer. If adhesive strip edges start to loosen and curl up, you may trim the loose edges. Do not remove adhesive strips completely unless your health care provider tells you to do that.  Check your incision areas every day for signs of infection. Check for: ? More redness, swelling, or pain. ? Fluid or blood. ? Warmth. ? Pus or a bad smell.   Activity  Rest as told by your healthcare provider.  Return to your normal activities as told by your health care provider. Ask your health care provider what activities are safe for you.  Avoid sitting for a long time without moving. Get up to take short walks every 1-2 hours. This is important to improve blood flow and breathing. Ask for help if you feel weak or unsteady.  Do not lift anything that is heavier than 10 lb (4.5 kg), or the limit that you are told, until your health care provider says that it is safe.  If you were given a sedative during the procedure, it can affect you for several hours. Do not drive or operate machinery until your health care provider says that it is safe.   Lifestyle  Do not use any products that contain nicotine or tobacco. These products include cigarettes, chewing tobacco, and vaping  devices, such as e-cigarettes. These can delay healing after surgery. If you need help quitting, ask your health care provider.  Do not drink alcohol until your health care provider approves. General instructions  Do not douche, use tampons, or have sex for at least 6 weeks, or as told by your health care provider.  If you struggle with physical or emotional changes after your procedure, speak with your health care provider or a therapist.  Do not take baths, swim, or use a hot tub until your health care provider approves. You may only be allowed to  take showers for 2-3 weeks.  Keep your dressing dry until your health care provider says it can be removed.  Try to have someone at home with you for the first 1-2 weeks to help with your daily chores.  Wear compression stockings as told by your health care provider. These stockings help to prevent blood clots and reduce swelling in your legs.  Keep all follow-up visits. This is important.   Contact a health care provider if:  You have any of these signs of infection: ? More redness, swelling, warmth, or pain around an incision. ? Fluid or blood coming from an incision. ? Pus or a bad smell coming from an incision. ? Chills or a fever.  An incision opens.  Your pain medicine is not helping.  You feel dizzy or light-headed.  You have pain or bleeding when you urinate.  You have nausea and vomiting that does not go away.  You have pus, or a bad-smelling discharge coming from your vagina. Get help right away if:  You have a fever and your symptoms suddenly get worse.  You have severe abdominal pain.  You have chest pain.  You have shortness of breath.  You faint.  You have pain, swelling, or redness in your leg.  You have heavy vaginal bleeding and blood clots, soaking through a sanitary pad in less than 1 hour. These symptoms may represent a serious problem that is an emergency. Do not wait to see if the symptoms will go away. Get medical help right away. Call your local emergency services (911 in the [place] Do not drive yourself to the hospital. Summary  After the procedure, it is common to have abdominal pain and vaginal bleeding.  Wear a sanitary pad for vaginal discharge or bleeding.  You should not drive or lift heavy objects until your health care provider says that it is safe.  Contact your health care provider if you have any symptoms of infection, heavy vaginal bleeding, nausea, vomiting, or shortness of breath. This information is not intended to replace  advice given to you by your health care provider. Make sure you discuss any questions you have with your health care provider. Document Revised: 01/19/2020 Document Reviewed: 01/19/2020 Elsevier Patient Education  2021 [place]   1) The drugs that you were given will stay in your system until tomorrow so for the next 24 hours you should not:  A) Drive an automobile B) Make any legal decisions C) Drink any alcoholic beverage   2) You may resume regular meals tomorrow.  Today it is better to start with liquids and gradually work up to solid foods.  You may eat anything you prefer, but it is better to start with liquids, then soup and crackers, and gradually work up to solid foods.   3) Please notify your doctor immediately if you have any unusual bleeding,  trouble breathing, redness and pain at the surgery site, drainage, fever, or pain not relieved by medication.    4) Additional Instructions:  Do not remove green band for 4 days   Please contact your physician with any problems or Same Day Surgery at [phone], Monday through Friday 6 am to 4 pm, or [facility] number at [phone].

## 2020-08-29 NOTE — Op Note (Addendum)
Operative Note    PRE-OP DIAGNOSIS: Endometrial hyperplasia, postmenopausal bleeding   POST-OP DIAGNOSIS:  Endometrial hyperplasia, postmenopausal bleeding  SURGEON: Adrian Prows MD  ASSISTANT:  Prentice Docker MD MD  ANESTHESIA: General  PROCEDURE: Procedure(s):Robotic assisted total hysterectomy and bilateral salpingo-oophorectomy. Cystoscopy   ESTIMATED BLOOD LOSS: 10 cc  DRAINS: Foley  SPECIMENS:  Uterus, and bilateral fallopian tubes and ovaries  COMPLICATIONS: None  DISPOSITION: PACU  CONDITION: Stable  INDICATIONS:  Endometrial hyperplasia, postmenopausal bleeding  FINDINGS: Exam under anesthesia revealed a 7 week mobile uterus. There were no adnexal masses or nodularity. The parametria was smooth. The cervix was negative for gross lesions. Intraoperative findings included: The uterus was 7 cm. The adnexa were normal bilaterally. The upper abdomen was normal including omentum, bowel, liver, stomach, and diaphragmatic surfaces. There was no evidence of grossly enlarged pelvic or right para-aortic lymph nodes.   PROCEDURE IN DETAIL: After informed consent was obtained, the patient was taken to the operating room where anesthesia was obtained without difficulty. The patient was positioned in the dorsal lithotomy position in [place] and her arms were carefully tucked at her sides and the usual precautions were taken.  She was prepped and draped in normal sterile fashion.  Time-out was performed. Dr. Gilman Schmidt placed the Foley catheter. A speculum was placed in the vagina and the cervical os was dilator. The uterus sounded to 7 cm.  A standard VCare uterine manipulator was then placed in the uterus without incident.    Operative entry was obtained via a supraumbilical incision and direct entry. The 30mm robotic optiview port was placed, abdomen insuffulated, and pelvis visualized with noted findings above.  The patient was placed in Trendelenburg and the bowel was  displaced up into the upper abdomen.  The 3 additional robotic port were placed in a horizontal line across the upper abdomen. Robotic docking was performed. The right infundibulopelvic ligaments were identified and resected with the vessel sealer to remove the right ovary and fallopian tube. The right round ligaments was divided. The broad ligament was dissected and a bladder flap was created. The bladder was dissected down off the lower uterine segment and cervix. The uterine artery was skeletonized, sealed and divided with the Vesselsealer device or cautery. This same procedure was repeated on the left.   A colpotomy was performed circumferentially along the V-Care ring with electrocautery and the cervix was incised from the vagina. The V-care was removed.  The uterus, fallopian tubes, ovaries and v-care device were removed through the vagina.   A pneumo balloon was placed in the vagina and the vaginal cuff was then closed in a running continuous fashion using 0 V-Lock suture with careful attention to include the vaginal cuff angles and the vaginal mucosa within the closure.   Arista was applied to the vaginal cuff.    The trocars were removed and the fascia of the umbilical port was closed with 0 Vicryl suture using running technique.   The skin incision at the umbilicus was closed with subcuticular stitch.  The remaining skin incisions were closed with subcuticular stitch and Indermil glue.  The patient tolerated the procedure well.  Sponge, lap and needle counts were correct x2.  The patient was taken to recovery room in excellent condition.  The foley catheter was removed.  Cystoscopy was performed and efflux of urine was noted from bilateral ureters.  The cystoscope was removed from the bladder and the bladder was drained.    Antibiotics: Preoperative antibiotics were given  VTE  prophylaxis: was not ordered perioperatively.  Dr. Glennon Mac assisted with this case. This was a high level case  requiring a Physicist, medical. No other assistant was readily available. He assisted with laparoscopic entry, port placement, uterine manipulation, and skin closure.    Adrian Prows MD, [facility], [facility] 08/29/2020 12:43 PM

## 2020-08-30 ENCOUNTER — Telehealth: Payer: Self-pay

## 2020-08-30 LAB — SURGICAL PATHOLOGY

## 2020-08-30 NOTE — Telephone Encounter (Signed)
Called pt to f/u from last nights call. Says she did not take Benadryl since she didn't have any. She is looking/feeling better. Says things make sense since her skin around her eyes is very sensitive. Will f/u as needed.

## 2020-08-30 NOTE — Anesthesia Postprocedure Evaluation (Signed)
Anesthesia Post Note  Patient: Maureen King  Procedure(s) Performed: XI ROBOTIC ASSISTED LAPAROSCOPIC HYSTERECTOMY AND SALPINGECTOMY, BILATERAL OOPHORECTOMY (Bilateral ) CYSTOSCOPY (N/A )  Patient location during evaluation: PACU Anesthesia Type: General Level of consciousness: awake and alert Pain management: pain level controlled Vital Signs Assessment: post-procedure vital signs reviewed and stable Respiratory status: spontaneous breathing, nonlabored ventilation and respiratory function stable Cardiovascular status: blood pressure returned to baseline and stable Postop Assessment: no apparent nausea or vomiting Anesthetic complications: no   No complications documented.   Last Vitals:  Vitals:   08/29/20 1343 08/29/20 1422  BP: (!) 144/78 (!) 141/72  Pulse: 70 72  Resp: 16 16  Temp: (!) 36.2 C (!) 36.3 C  SpO2:  98%    Last Pain:  Vitals:   08/29/20 1422  TempSrc:   PainSc: 2                  Tera Mater

## 2020-09-03 ENCOUNTER — Ambulatory Visit: Payer: Managed Care, Other (non HMO) | Admitting: Obstetrics and Gynecology

## 2020-09-12 ENCOUNTER — Other Ambulatory Visit: Payer: Self-pay

## 2020-09-12 ENCOUNTER — Encounter: Payer: Self-pay | Admitting: Obstetrics and Gynecology

## 2020-09-12 ENCOUNTER — Ambulatory Visit (INDEPENDENT_AMBULATORY_CARE_PROVIDER_SITE_OTHER): Payer: Managed Care, Other (non HMO) | Admitting: Obstetrics and Gynecology

## 2020-09-12 VITALS — BP 130/72 | Ht 60.0 in | Wt 227.0 lb

## 2020-09-12 DIAGNOSIS — Z90710 Acquired absence of both cervix and uterus: Secondary | ICD-10-CM

## 2020-09-12 DIAGNOSIS — Z90721 Acquired absence of ovaries, unilateral: Secondary | ICD-10-CM

## 2020-09-12 NOTE — Progress Notes (Signed)
  Postoperative Follow-up Patient presents post op from  [facility] Robot-assisted Laparoscopic Hysterectomy and Bilateral Salpingo-oophorectomy  for endometrial hyperplasia and postmenopausal bleeding , 2 weeks ago.  Subjective: Patient reports marked improvement in her preop symptoms. Eating a regular diet without difficulty. Pain is controlled without any medications.  Activity: sedentary. Patient reports additional symptom's since surgery of None.  Objective: BP 130/72   Ht 5' (1.524 m)   Wt 227 lb (103 kg)   LMP  (LMP Unknown)   BMI 44.33 kg/m  Physical Exam Constitutional:      Appearance: Normal appearance. She is well-developed.  HENT:     Head: Normocephalic and atraumatic.  Eyes:     Extraocular Movements: Extraocular movements intact.     Pupils: Pupils are equal, round, and reactive to light.  Neck:     Thyroid: No thyromegaly.  Cardiovascular:     Rate and Rhythm: Normal rate and regular rhythm.     Heart sounds: Normal heart sounds.  Pulmonary:     Effort: Pulmonary effort is normal.     Breath sounds: Normal breath sounds.  Abdominal:     General: Bowel sounds are normal. There is no distension.     Palpations: Abdomen is soft. There is no mass.     Comments: Incisions are clean, dry, and intact. Minimal erythema.  Musculoskeletal:     Cervical back: Neck supple.  Neurological:     Mental Status: She is alert and oriented to person, place, and time.  Skin:    General: Skin is warm and dry.  Psychiatric:        Behavior: Behavior normal.        Thought Content: Thought content normal.        Judgment: Judgment normal.  Vitals reviewed.   Assessment: s/p :  Xi Robot-assisted Laparoscopic Hysterectomy and Bilateral Salpingo-oophorectomy  stable  Plan: Patient has done well after surgery with no apparent complications.  I have discussed the post-operative course to date, and the expected progress moving forward.  The patient understands what complications to be  concerned about.  I will see the patient in routine follow up, or sooner if needed.    Activity plan: No heavy lifting.  Pelvic rest until 12 weeks after surgery.  Adrian Prows MD, [facility], [facility] 09/12/2020 3:12 PM

## 2020-10-10 ENCOUNTER — Ambulatory Visit (INDEPENDENT_AMBULATORY_CARE_PROVIDER_SITE_OTHER): Payer: Managed Care, Other (non HMO) | Admitting: Obstetrics and Gynecology

## 2020-10-10 ENCOUNTER — Encounter: Payer: Self-pay | Admitting: Obstetrics and Gynecology

## 2020-10-10 ENCOUNTER — Other Ambulatory Visit: Payer: Self-pay

## 2020-10-10 VITALS — BP 124/80 | Ht 60.0 in | Wt 227.0 lb

## 2020-10-10 DIAGNOSIS — Z4889 Encounter for other specified surgical aftercare: Secondary | ICD-10-CM

## 2020-10-10 DIAGNOSIS — Z90710 Acquired absence of both cervix and uterus: Secondary | ICD-10-CM

## 2020-10-10 DIAGNOSIS — Z90721 Acquired absence of ovaries, unilateral: Secondary | ICD-10-CM

## 2020-10-10 NOTE — Progress Notes (Signed)
  Postoperative Follow-up Patient presents post op from  [facility] Robot-assisted Laparoscopic Hysterectomy and Bilateral Salpingo-oophorectomy  for  Endometrial hyperplasia , 6 weeks ago.  Subjective: Patient reports marked improvement in her preop symptoms. Eating a regular diet without difficulty. Pain is controlled without any medications.  Activity: normal activities of daily living. Patient reports additional symptom's since surgery of None.  Objective: BP 124/80   Ht 5' (1.524 m)   Wt 227 lb (103 kg)   LMP  (LMP Unknown)   BMI 44.33 kg/m  Physical Exam Constitutional:      Appearance: Normal appearance. She is well-developed.  HENT:     Head: Normocephalic and atraumatic.  Eyes:     Extraocular Movements: Extraocular movements intact.     Pupils: Pupils are equal, round, and reactive to light.  Neck:     Thyroid: No thyromegaly.  Cardiovascular:     Rate and Rhythm: Normal rate and regular rhythm.     Heart sounds: Normal heart sounds.  Pulmonary:     Effort: Pulmonary effort is normal.     Breath sounds: Normal breath sounds.  Abdominal:     General: Bowel sounds are normal. There is no distension.     Palpations: Abdomen is soft. There is no mass.     Comments: Incisions are clean, dry and intact  Musculoskeletal:     Cervical back: Neck supple.  Neurological:     Mental Status: She is alert and oriented to person, place, and time.  Skin:    General: Skin is warm and dry.  Psychiatric:        Behavior: Behavior normal.        Thought Content: Thought content normal.        Judgment: Judgment normal.  Vitals reviewed.     Assessment: s/p :  Xi Robot-assisted Laparoscopic Hysterectomy and Bilateral Salpingo-oophorectomy    stable  Plan: Patient has done well after surgery with no apparent complications.  I have discussed the post-operative course to date, and the expected progress moving forward.  The patient understands what complications to be concerned about.  I  will see the patient in routine follow up, or sooner if needed.    Activity plan: No restriction.  Return to work on 10/15/2020, note given.  Adrian Prows MD, [place], [facility] 10/10/2020 10:40 AM

## 2020-11-14 ENCOUNTER — Ambulatory Visit (INDEPENDENT_AMBULATORY_CARE_PROVIDER_SITE_OTHER): Payer: Managed Care, Other (non HMO) | Admitting: Obstetrics and Gynecology

## 2020-11-14 ENCOUNTER — Encounter: Payer: Self-pay | Admitting: Obstetrics and Gynecology

## 2020-11-14 ENCOUNTER — Other Ambulatory Visit: Payer: Self-pay

## 2020-11-14 VITALS — BP 128/72 | Ht 60.0 in | Wt 226.0 lb

## 2020-11-14 DIAGNOSIS — Z90721 Acquired absence of ovaries, unilateral: Secondary | ICD-10-CM

## 2020-11-14 DIAGNOSIS — Z4889 Encounter for other specified surgical aftercare: Secondary | ICD-10-CM

## 2020-11-14 DIAGNOSIS — Z90710 Acquired absence of both cervix and uterus: Secondary | ICD-10-CM

## 2020-11-14 NOTE — Progress Notes (Signed)
  Postoperative Follow-up Patient presents post op from  [facility] Robot-assisted Laparoscopic Hysterectomy and Bilateral Salpingo-oophorectomy  for  endometrial hyperplasia , 3 months ago.  Subjective: Patient reports marked improvement in her preop symptoms. Eating a regular diet without difficulty. The patient is not having any pain.  Activity: normal activities of daily living. Patient reports additional symptom's since surgery of None.  Objective: BP 128/72   Ht 5' (1.524 m)   Wt 226 lb (102.5 kg)   LMP  (LMP Unknown)   BMI 44.14 kg/m  Physical Exam Constitutional:      Appearance: She is well-developed.  Genitourinary:     Genitourinary Comments: External: Normal appearing vulva. No lesions noted.  Speculum examination: Normal appearing vaginal cuff. Intact Bimanual examination: Cuff intact.  HENT:     Head: Normocephalic and atraumatic.  Neck:     Thyroid: No thyromegaly.  Cardiovascular:     Rate and Rhythm: Normal rate and regular rhythm.     Heart sounds: Normal heart sounds.  Pulmonary:     Effort: Pulmonary effort is normal.     Breath sounds: Normal breath sounds.  Abdominal:     General: Bowel sounds are normal. There is no distension.     Palpations: Abdomen is soft. There is no mass.  Musculoskeletal:     Cervical back: Neck supple.  Neurological:     Mental Status: She is alert and oriented to person, place, and time.  Skin:    General: Skin is warm and dry.  Psychiatric:        Behavior: Behavior normal.        Thought Content: Thought content normal.  Vitals reviewed.    Assessment: s/p :  Xi Robot-assisted Laparoscopic Hysterectomy and Bilateral Salpingo-oophorectomy stable  Plan: Patient has done well after surgery with no apparent complications.  I have discussed the post-operative course to date, and the expected progress moving forward.  The patient understands what complications to be concerned about.  I will see the patient in routine follow up, or  sooner if needed.    Note given for work.  Activity plan: No restriction.  Adrian Prows MD, [place], [facility] 11/14/2020 10:17 AM

## 2020-12-20 ENCOUNTER — Encounter: Payer: Managed Care, Other (non HMO) | Admitting: Family Medicine

## 2021-01-07 ENCOUNTER — Encounter: Payer: Self-pay | Admitting: Family Medicine

## 2021-01-07 ENCOUNTER — Ambulatory Visit (INDEPENDENT_AMBULATORY_CARE_PROVIDER_SITE_OTHER): Payer: Managed Care, Other (non HMO) | Admitting: Family Medicine

## 2021-01-07 ENCOUNTER — Other Ambulatory Visit: Payer: Self-pay

## 2021-01-07 VITALS — BP 114/75 | HR 60 | Temp 98.2°F | Ht 59.2 in | Wt 230.0 lb

## 2021-01-07 DIAGNOSIS — F3341 Major depressive disorder, recurrent, in partial remission: Secondary | ICD-10-CM

## 2021-01-07 DIAGNOSIS — Z0000 Encounter for general adult medical examination without abnormal findings: Secondary | ICD-10-CM

## 2021-01-07 DIAGNOSIS — Z23 Encounter for immunization: Secondary | ICD-10-CM

## 2021-01-07 LAB — URINALYSIS, ROUTINE W REFLEX MICROSCOPIC
Bilirubin, UA: NEGATIVE
Glucose, UA: NEGATIVE
Ketones, UA: NEGATIVE
Leukocytes,UA: NEGATIVE
Nitrite, UA: NEGATIVE
Protein,UA: NEGATIVE
RBC, UA: NEGATIVE
Specific Gravity, UA: 1.01 (ref 1.005–1.030)
Urobilinogen, Ur: 0.2 mg/dL (ref 0.2–1.0)
pH, UA: 6 (ref 5.0–7.5)

## 2021-01-07 MED ORDER — PAROXETINE HCL ER 12.5 MG PO TB24: 12.5000 mg | ORAL_TABLET | Freq: Every day | ORAL | 1 refills | Status: DC

## 2021-01-07 NOTE — Assessment & Plan Note (Signed)
Will consider medications. Information provided. Will let us know if she'd like to start something. Encouraged diet and exercise with goal of losing 1-2lbs per week

## 2021-01-07 NOTE — Assessment & Plan Note (Signed)
Under good control on current regimen. Continue current regimen. Continue to monitor. Call with any concerns. Refills given.   

## 2021-01-07 NOTE — Progress Notes (Signed)
BP 114/75   Pulse 60   Temp 98.2 F (36.8 C) (Oral)   Ht 4' 11.2" (1.504 m)   Wt 230 lb (104.3 kg)   LMP  (LMP Unknown)   SpO2 97%   BMI 46.14 kg/m    Subjective:    Patient ID: Maureen King, female    DOB: March 05, 1956, 65 y.o.   MRN: [id number]  HPI: [name] is a 65 y.o. female presenting on 01/07/2021 for comprehensive medical examination. Current medical complaints include:  DEPRESSION Mood status: controlled Satisfied with current treatment?: yes Symptom severity: mild  Duration of current treatment : chronic Side effects: no Medication compliance: excellent compliance Psychotherapy/counseling: no  Previous psychiatric medications: paxil Depressed mood: no Anxious mood: no Anhedonia: no Significant weight loss or gain: no Insomnia: no  Fatigue: no Feelings of worthlessness or guilt: no Impaired concentration/indecisiveness: no Suicidal ideations: no Hopelessness: no Crying spells: no Depression screen [facility] 2/9 01/07/2021 06/25/2020 04/12/2020 11/28/2019 03/16/2019  Decreased Interest 0 1 0 1 1  Down, Depressed, Hopeless 0 '1 1 1 1  '$ PHQ - 2 Score 0 '2 1 2 2  '$ Altered sleeping 0 '1 2 1 1  '$ Tired, decreased energy 0 0 '1 1 1  '$ Change in appetite 1 0 0 1 1  Feeling bad or failure about yourself  0 0 0 0 0  Trouble concentrating 0 0 3 0 0  Moving slowly or fidgety/restless 0 0 0 0 0  Suicidal thoughts 0 0 0 0 0  PHQ-9 Score '1 3 7 5 5  '$ Difficult doing work/chores Not difficult at all - Very difficult Somewhat difficult Somewhat difficult   OBESITY Duration: chronic Previous attempts at weight loss: yes Complications of obesity: depression, GERD Peak weight: current (230) Weight loss goal: to be healthy Weight loss to date: none Requesting obesity pharmacotherapy: yes Current weight loss supplements/medications: no Previous weight loss supplements/meds: no  Menopausal Symptoms: no  Depression Screen done today and results listed below:   Depression screen [facility] 2/9 01/07/2021 06/25/2020 04/12/2020 11/28/2019 03/16/2019  Decreased Interest 0 1 0 1 1  Down, Depressed, Hopeless 0 '1 1 1 1  '$ PHQ - 2 Score 0 '2 1 2 2  '$ Altered sleeping 0 '1 2 1 1  '$ Tired, decreased energy 0 0 '1 1 1  '$ Change in appetite 1 0 0 1 1  Feeling bad or failure about yourself  0 0 0 0 0  Trouble concentrating 0 0 3 0 0  Moving slowly or fidgety/restless 0 0 0 0 0  Suicidal thoughts 0 0 0 0 0  PHQ-9 Score '1 3 7 5 5  '$ Difficult doing work/chores Not difficult at all - Very difficult Somewhat difficult Somewhat difficult    Past Medical History:  Past Medical History:  Diagnosis Date   Anxiety    Depression    GERD (gastroesophageal reflux disease)    Insomnia    Obesity    Palpitations     Surgical History:  Past Surgical History:  Procedure Laterality Date   COLONOSCOPY     COLPOSCOPY N/A 05/30/2020   Procedure: COLPOSCOPY;  Surgeon: Homero Fellers, MD;  Location: [facility] ORS;  Service: Gynecology;  Laterality: N/A;   CYSTOSCOPY N/A 08/29/2020   Procedure: CYSTOSCOPY;  Surgeon: Homero Fellers, MD;  Location: [facility] ORS;  Service: Gynecology;  Laterality: N/A;   HYSTEROSCOPY WITH D & C N/A 05/30/2020   Procedure: DILATATION AND CURETTAGE /HYSTEROSCOPY;  Surgeon: Homero Fellers, MD;  Location: [facility]  ORS;  Service: Gynecology;  Laterality: N/A;   NO PAST SURGERIES     ROBOTIC ASSISTED LAPAROSCOPIC HYSTERECTOMY AND SALPINGECTOMY Bilateral 08/29/2020   Procedure: XI ROBOTIC ASSISTED LAPAROSCOPIC HYSTERECTOMY AND SALPINGECTOMY, BILATERAL OOPHORECTOMY;  Surgeon: Homero Fellers, MD;  Location: [facility] ORS;  Service: Gynecology;  Laterality: Bilateral;    Medications:  Current Outpatient Medications on File Prior to Visit  Medication Sig   aspirin EC 81 MG tablet Take 81 mg by mouth daily. Swallow whole.   B Complex-C (B-COMPLEX WITH VITAMIN C) tablet Take 1 tablet by mouth daily.   Calcium Carb-Cholecalciferol (CALTRATE 600+D3 PO) Take 1  tablet by mouth daily.   Cholecalciferol (VITAMIN D3) 50 MCG (2000 UT) TABS Take 2,000 Units by mouth daily.   HOMEOPATHIC PRODUCTS PO Take 1 capsule by mouth daily. doTerra On Guard+ Protective Essential Oil Blend   Magnesium 250 MG TABS Take 250 mg by mouth daily.   Misc Natural Products (ELDERBERRY ZINC/VIT C/IMMUNE MT) Take 1 capsule by mouth daily.   No current facility-administered medications on file prior to visit.    Allergies:  Allergies  Allergen Reactions   Wound Dressing Adhesive Rash   Penicillins Rash   Neosporin [Neomycin-Polymyxin-Gramicidin] Rash    Social History:  Social History   Socioeconomic History   Marital status: Married    Spouse name: Not on file   Number of children: Not on file   Years of education: Not on file   Highest education level: Not on file  Occupational History   Not on file  Tobacco Use   Smoking status: Never   Smokeless tobacco: Never  Vaping Use   Vaping Use: Never used  Substance and Sexual Activity   Alcohol use: Yes    Alcohol/week: 0.0 standard drinks    Comment: on occasion (4x/year)   Drug use: No   Sexual activity: Not Currently  Other Topics Concern   Not on file  Social History Narrative   Not on file   Social Determinants of Health   Financial Resource Strain: Not on file  Food Insecurity: Not on file  Transportation Needs: Not on file  Physical Activity: Not on file  Stress: Not on file  Social Connections: Not on file  Intimate Partner Violence: Not on file   Social History   Tobacco Use  Smoking Status Never  Smokeless Tobacco Never   Social History   Substance and Sexual Activity  Alcohol Use Yes   Alcohol/week: 0.0 standard drinks   Comment: on occasion (4x/year)    Family History:  Family History  Problem Relation Age of Onset   Hypertension Mother    Osteoporosis Mother    Heart disease Father 23       Stent   Lung disease Father    Idiopathic pulmonary fibrosis Father     Hypertension Brother    Cancer Maternal Grandmother        gallbladder   Heart disease Paternal Grandfather        MI   Breast cancer Neg Hx     Past medical history, surgical history, medications, allergies, family history and social history reviewed with patient today and changes made to appropriate areas of the chart.   Review of Systems  Constitutional: Negative.   HENT: Negative.    Eyes: Negative.   Respiratory: Negative.    Cardiovascular: Negative.   Gastrointestinal: Negative.   Genitourinary: Negative.   Musculoskeletal: Negative.   Skin: Negative.   Neurological: Negative.   Endo/Heme/Allergies: Negative.  Psychiatric/Behavioral: Negative.    All other ROS negative except what is listed above and in the HPI.      Objective:    BP 114/75   Pulse 60   Temp 98.2 F (36.8 C) (Oral)   Ht 4' 11.2" (1.504 m)   Wt 230 lb (104.3 kg)   LMP  (LMP Unknown)   SpO2 97%   BMI 46.14 kg/m   Wt Readings from Last 3 Encounters:  01/07/21 230 lb (104.3 kg)  11/14/20 226 lb (102.5 kg)  10/10/20 227 lb (103 kg)    Physical Exam Vitals and nursing note reviewed.  Constitutional:      General: She is not in acute distress.    Appearance: Normal appearance. She is obese. She is not ill-appearing, toxic-appearing or diaphoretic.  HENT:     Head: Normocephalic and atraumatic.     Right Ear: Tympanic membrane, ear canal and external ear normal. There is no impacted cerumen.     Left Ear: Tympanic membrane, ear canal and external ear normal. There is no impacted cerumen.     Nose: Nose normal. No congestion or rhinorrhea.     Mouth/Throat:     Mouth: Mucous membranes are moist.     Pharynx: Oropharynx is clear. No oropharyngeal exudate or posterior oropharyngeal erythema.  Eyes:     General: No scleral icterus.       Right eye: No discharge.        Left eye: No discharge.     Extraocular Movements: Extraocular movements intact.     Conjunctiva/sclera: Conjunctivae normal.      Pupils: Pupils are equal, round, and reactive to light.  Neck:     Vascular: No carotid bruit.  Cardiovascular:     Rate and Rhythm: Normal rate and regular rhythm.     Pulses: Normal pulses.     Heart sounds: No murmur heard.   No friction rub. No gallop.  Pulmonary:     Effort: Pulmonary effort is normal. No respiratory distress.     Breath sounds: Normal breath sounds. No stridor. No wheezing, rhonchi or rales.  Chest:     Chest wall: No tenderness.  Abdominal:     General: Abdomen is flat. Bowel sounds are normal. There is no distension.     Palpations: Abdomen is soft. There is no mass.     Tenderness: There is no abdominal tenderness. There is no right CVA tenderness, left CVA tenderness, guarding or rebound.     Hernia: No hernia is present.  Genitourinary:    Comments: Breast and pelvic exams deferred with shared decision making Musculoskeletal:        General: No swelling, tenderness, deformity or signs of injury.     Cervical back: Normal range of motion and neck supple. No rigidity. No muscular tenderness.     Right lower leg: No edema.     Left lower leg: No edema.  Lymphadenopathy:     Cervical: No cervical adenopathy.  Skin:    General: Skin is warm and dry.     Capillary Refill: Capillary refill takes less than 2 seconds.     Coloration: Skin is not jaundiced or pale.     Findings: No bruising, erythema, lesion or rash.  Neurological:     General: No focal deficit present.     Mental Status: She is alert and oriented to person, place, and time. Mental status is at baseline.     Cranial Nerves: No cranial nerve deficit.  Sensory: No sensory deficit.     Motor: No weakness.     Coordination: Coordination normal.     Gait: Gait normal.     Deep Tendon Reflexes: Reflexes normal.  Psychiatric:        Mood and Affect: Mood normal.        Behavior: Behavior normal.        Thought Content: Thought content normal.        Judgment: Judgment normal.     Results for orders placed or performed during the hospital encounter of 08/29/20  Surgical pathology  Result Value Ref Range   SURGICAL PATHOLOGY      SURGICAL PATHOLOGY CASE: [id number] PATIENT: [name] Surgical Pathology Report     Specimen Submitted: A. Uterus, cervix, bil fall tubes, ovaries  Clinical History: Endometrial hyperplasia without atypia    DIAGNOSIS: A. UTERUS WITH CERVIX, BILATERAL FALLOPIAN TUBES AND OVARIES; TOTAL HYSTERECTOMY WITH BILATERAL SALPINGO-OOPHORECTOMY: - UTERINE CERVIX:      - BENIGN TRANSFORMATION ZONE.      - NEGATIVE FOR SQUAMOUS INTRAEPITHELIAL LESION AND MALIGNANCY. - ENDOMETRIUM:      - DISORDERED PROLIFERATIVE ENDOMETRIUM.      - NEGATIVE FOR ATYPICAL HYPERPLASIA/EIN AND MALIGNANCY. - MYOMETRIUM:      - ADENOMYOSIS.      - LEIOMYOMATA UTERI.      - NEGATIVE FOR FEATURES OF MALIGNANCY. - FALLOPIAN TUBES:      - BENIGN PARATUBAL CYSTS.      - OTHERWISE NO SIGNIFICANT HISTOPATHOLOGIC CHANGE. - OVARIES:      - BENIGN PHYSIOLOGIC CHANGES.      - NEGATIVE FOR MALIGNANCY.  Comment: Multiple additional deeper recut levels were performed, to fully visuali ze the cervical transformation zone.  GROSS DESCRIPTION: A. Labeled: Uterus, cervix, bilateral tubes and ovaries Received: Formalin Collection time: 11:24 AM on 08/29/2020 Placed into formalin time: 11:44 AM on 08/29/2020 Weight: 38 grams Dimensions:      Fundus -5.9 (superior to inferior) x 3.9 (breadth of uterus at fundus) x 2 (anterior to posterior) cm      Cervix -3.1 x 2.6 cm Serosa: The serosa is tan-pink, smooth, and glistening with a 0.8 x 0.6 cm defect on the right slightly anterior fundus. Cervix: The cervix is tan-pink, smooth, and pearly with areas of hemorrhage at the 12 o'clock position. Endocervix: The endocervix is tan, mucoid, and finely granular with a 0.6 cm slightly rounded endocervical os.  The adjacent areas of cautery are inked as  follows: Anterior = blue and posterior = black. Endometrial cavity:      Dimensions -2.2 (superior to inferior) x 1.8 (cornu to cornu) cm      Thickness -Ranges from 0.1-0.4 cm      Other findings -the endometrium is  tan-red and granular with scattered areas of thickening.  The serosal defect focally extends into the endometrial cavity at the fundus. Myometrium:     Thickness -1.1 cm     Other findings -the myometrium is tan-pink with 5 intramural nodules ranging from 0.3 to 0.8 cm in greatest dimension.  The nodules have tan-white, whorled, firm, and well-circumscribed cut surfaces. Adnexa:      Right ovary           Weight -225 grams           Measurement -2.5 x 1.6 x 1.2 cm           Serosa -the serosa is tan-yellow and cerebriform.  Cut surface -the cut surface is tan-pink and homogenous. There is a peripheral 0.7 x 0.3 x 0.2 cm area of hemorrhage.      Right fallopian tube           Measurements -5.2 cm in length x 0.5 cm in diameter           Other findings -the fallopian tube is fimbriated with a tan-pink, smooth, and glistening serosa.  There are multiple paratubal cysts ranging from 0.1 to 0.3 cm in greatest dimension.  The lumen is pinpoint and patent.       Left ovary            Weight -2.08 grams           Measurement -2.5 x 1.4 x 1.2 cm           Serosa -the serosa is tan-yellow and cerebriform.           Cut surface -the cut surface is tan-white with a peripheral 0.4 x 0.3 x 0.2 cm area of yellow discoloration and a central 0.7 x 0.7 x 0.4 cm area of hemorrhage.      Left fallopian tube            Measurements -4.2 cm in length x 0.5 cm in diameter           Other findings -the fallopian tube is fimbriated with a pink-tan, smooth, and glistening serosa.  There are multiple paratubal cysts, up to 0.1 cm in greatest dimension.  The lumen is pinpoint and patent. Other comments: None grossly identified.  Block summary: 1 - representative cervix/endocervix,  12:00 2 - representative cervix/endocervix, 3:00 3 - representative cervix/endocervix, 6:00 4 - representative cervix/endocervix, 9:00 5 - representative anterior transmural endomyometrium with thickened endometrium 6 - representative posterior transmural endomyomet rium with thickened endometrium 7 - representative serosal defect 8 - representative intramural nodules 9 - representative right ovary 10 - right fallopian tube fimbria, longitudinally sectioned and submitted entirely 11 - representative right fallopian tube cross-sections 12 - representative left ovary 13 - left fallopian tube fimbria, longitudinally sectioned and submitted entirely 14 - representative left fallopian tube cross-sections  RB 08/29/2020  Final Diagnosis performed by Allena Napoleon, MD.   Electronically signed 08/30/2020 4:33:53PM The electronic signature indicates that the named Attending Pathologist has evaluated the specimen Technical component performed at [facility], [address] [place], [place] [address] Lab: [phone] Dir: Rush Farmer, MD, MMM  Professional component performed at [facility], [facility], [place], [place], [place] [address] Lab: [phone] Dir: Dellia Nims. Reuel Derby, MD       Assessment & Plan:   Problem List Items Addressed This Visit       Other   Morbid obesity ([place])    Will consider medications. Information provided. Will let us know if she'd like to start something. Encouraged diet and exercise with goal of losing 1-2lbs per week       Depression    Under good control on current regimen. Continue current regimen. Continue to monitor. Call with any concerns. Refills given.         Relevant Medications   PARoxetine (PAXIL-CR) 12.5 MG 24 hr tablet   Other Visit Diagnoses     Routine general medical examination at a health care facility    -  Primary   Vaccines updated. Screening labs checked today. Pap, mammo, colonoscopy up to date. Continue  diet and exercise. Call with any concerns.    Relevant Orders  CBC with Differential/Platelet   Comprehensive metabolic panel   Lipid Panel w/o Chol/HDL Ratio   Urinalysis, Routine w reflex microscopic   TSH   Need for diphtheria-tetanus-pertussis (Tdap) vaccine       Relevant Orders   Tdap vaccine greater than or equal to 7yo IM (Completed)        Follow up plan: Return in about 6 months (around 07/10/2021).   LABORATORY TESTING:  - Pap smear: not applicable  IMMUNIZATIONS:   - Tdap: Tetanus vaccination status reviewed: Tdap vaccination indicated and given today. - Influenza: Postponed to flu season - Pneumovax: Not applicable - Prevnar: Not applicable - COVID: Up to date - Shingrix vaccine: Refused  SCREENING: -Mammogram: Up to date  - Colonoscopy: Up to date   PATIENT COUNSELING:   Advised to take 1 mg of folate supplement per day if capable of pregnancy.   Sexuality: Discussed sexually transmitted diseases, partner selection, use of condoms, avoidance of unintended pregnancy  and contraceptive alternatives.   Advised to avoid cigarette smoking.  I discussed with the patient that most people either abstain from alcohol or drink within safe limits (<=14/week and <=4 drinks/occasion for males, <=7/weeks and <= 3 drinks/occasion for females) and that the risk for alcohol disorders and other health effects rises proportionally with the number of drinks per week and how often a drinker exceeds daily limits.  Discussed cessation/primary prevention of drug use and availability of treatment for abuse.   Diet: Encouraged to adjust caloric intake to maintain  or achieve ideal body weight, to reduce intake of dietary saturated fat and total fat, to limit sodium intake by avoiding high sodium foods and not adding table salt, and to maintain adequate dietary potassium and calcium preferably from fresh fruits, vegetables, and low-fat dairy products.    stressed the importance of  regular exercise  Injury prevention: Discussed safety belts, safety helmets, smoke detector, smoking near bedding or upholstery.   Dental health: Discussed importance of regular tooth brushing, flossing, and dental visits.    NEXT PREVENTATIVE PHYSICAL DUE IN 1 YEAR. Return in about 6 months (around 07/10/2021).

## 2021-01-08 LAB — COMPREHENSIVE METABOLIC PANEL
ALT: 20 IU/L (ref 0–32)
AST: 21 IU/L (ref 0–40)
Albumin/Globulin Ratio: 2.4 — ABNORMAL HIGH (ref 1.2–2.2)
Albumin: 4.5 g/dL (ref 3.8–4.8)
Alkaline Phosphatase: 116 IU/L (ref 44–121)
BUN/Creatinine Ratio: 22 (ref 12–28)
BUN: 17 mg/dL (ref 8–27)
Bilirubin Total: 0.3 mg/dL (ref 0.0–1.2)
CO2: 23 mmol/L (ref 20–29)
Calcium: 9.7 mg/dL (ref 8.7–10.3)
Chloride: 102 mmol/L (ref 96–106)
Creatinine, Ser: 0.79 mg/dL (ref 0.57–1.00)
Globulin, Total: 1.9 g/dL (ref 1.5–4.5)
Glucose: 93 mg/dL (ref 65–99)
Potassium: 4.6 mmol/L (ref 3.5–5.2)
Sodium: 141 mmol/L (ref 134–144)
Total Protein: 6.4 g/dL (ref 6.0–8.5)
eGFR: 83 mL/min/{1.73_m2} (ref >59)

## 2021-01-08 LAB — CBC WITH DIFFERENTIAL/PLATELET
Basophils Absolute: 0.1 10*3/uL (ref 0.0–0.2)
Basos: 1 %
EOS (ABSOLUTE): 0.5 10*3/uL — ABNORMAL HIGH (ref 0.0–0.4)
Eos: 7 %
Hematocrit: 42.3 % (ref 34.0–46.6)
Hemoglobin: 14.3 g/dL (ref 11.1–15.9)
Immature Grans (Abs): 0 10*3/uL (ref 0.0–0.1)
Immature Granulocytes: 0 %
Lymphocytes Absolute: 1.2 10*3/uL (ref 0.7–3.1)
Lymphs: 18 %
MCH: 29.9 pg (ref 26.6–33.0)
MCHC: 33.8 g/dL (ref 31.5–35.7)
MCV: 88 fL (ref 79–97)
Monocytes Absolute: 0.5 10*3/uL (ref 0.1–0.9)
Monocytes: 8 %
Neutrophils Absolute: 4.3 10*3/uL (ref 1.4–7.0)
Neutrophils: 66 %
Platelets: 300 10*3/uL (ref 150–450)
RBC: 4.79 x10E6/uL (ref 3.77–5.28)
RDW: 12.9 % (ref 11.7–15.4)
WBC: 6.5 10*3/uL (ref 3.4–10.8)

## 2021-01-08 LAB — TSH: TSH: 1.33 u[IU]/mL (ref 0.450–4.500)

## 2021-01-08 LAB — LIPID PANEL W/O CHOL/HDL RATIO
Cholesterol, Total: 239 mg/dL — ABNORMAL HIGH (ref 100–199)
HDL: 86 mg/dL (ref >39)
LDL Chol Calc (NIH): 121 mg/dL — ABNORMAL HIGH (ref 0–99)
Triglycerides: 189 mg/dL — ABNORMAL HIGH (ref 0–149)
VLDL Cholesterol Cal: 32 mg/dL (ref 5–40)

## 2021-01-29 ENCOUNTER — Other Ambulatory Visit: Payer: Self-pay | Admitting: Family Medicine

## 2021-01-29 NOTE — Telephone Encounter (Signed)
Requested medication (s) are due for refill today: No  Requested medication (s) are on the active medication list: Yes  Last refill:  01/07/21  Future visit scheduled: Yes  Notes to clinic:  Pharmacy requesting 1 year supply.    Requested Prescriptions  Pending Prescriptions Disp Refills   PARoxetine (PAXIL-CR) 12.5 MG 24 hr tablet [Pharmacy Med Name: PAROXETINE  12.'5MG'$   TAB  EXTENDED RELEASE] 90 tablet 3    Sig: TAKE 1 TABLET BY MOUTH  DAILY     Psychiatry:  Antidepressants - SSRI Passed - 01/29/2021  8:38 AM      Passed - Completed PHQ-2 or PHQ-9 in the last 360 days      Passed - Valid encounter within last 6 months    Recent Outpatient Visits           3 weeks ago Routine general medical examination at a health care facility   [facility], [place] P, DO   7 months ago Recurrent major depressive disorder, in partial remission ([place])   [place], Megan P, DO   9 months ago Postmenopausal bleeding   [place] P, DO   1 year ago Recurrent major depressive disorder, in partial remission ([place])   [place], Megan P, DO   1 year ago Routine general medical examination at a health care facility   [place], Barb Merino, DO       Future Appointments             In 5 months Wynetta Emery, Barb Merino, DO [facility]

## 2021-03-11 ENCOUNTER — Other Ambulatory Visit: Payer: Self-pay | Admitting: Family Medicine

## 2021-03-12 NOTE — Telephone Encounter (Signed)
Too soon. Last refill documented on 02/04/21 #90 Requested Prescriptions  Refused Prescriptions Disp Refills  . PARoxetine (PAXIL-CR) 12.5 MG 24 hr tablet [Pharmacy Med Name: PAROXETINE ER 12.5 MG TABLET] 90 tablet 2    Sig: TAKE 1 TABLET BY MOUTH EVERY DAY     Psychiatry:  Antidepressants - SSRI Passed - 03/11/2021 10:07 PM      Passed - Completed PHQ-2 or PHQ-9 in the last 360 days      Passed - Valid encounter within last 6 months    Recent Outpatient Visits          2 months ago Routine general medical examination at a health care facility   [facility], [place] P, DO   8 months ago Recurrent major depressive disorder, in partial remission ([place])   [place], Megan P, DO   11 months ago Postmenopausal bleeding   [place] P, DO   1 year ago Recurrent major depressive disorder, in partial remission ([place])   [place], Megan P, DO   1 year ago Routine general medical examination at a health care facility   [place], Barb Merino, DO      Future Appointments            In 4 months Wynetta Emery, Barb Merino, DO [facility], [facility]

## 2021-04-01 ENCOUNTER — Other Ambulatory Visit: Payer: Self-pay | Admitting: Family Medicine

## 2021-04-01 DIAGNOSIS — Z1231 Encounter for screening mammogram for malignant neoplasm of breast: Secondary | ICD-10-CM

## 2021-04-11 ENCOUNTER — Other Ambulatory Visit: Payer: Self-pay | Admitting: Family Medicine

## 2021-04-11 ENCOUNTER — Encounter: Payer: Self-pay | Admitting: Family Medicine

## 2021-04-11 NOTE — Telephone Encounter (Signed)
Change in pharmacy- remainder of Rx forwarded to new pharmacy Requested Prescriptions  Pending Prescriptions Disp Refills  . PARoxetine (PAXIL-CR) 12.5 MG 24 hr tablet [Pharmacy Med Name: PAROXETINE  12.5MG   TAB  EXTENDED RELEASE] 90 tablet 0    Sig: TAKE 1 TABLET BY MOUTH  DAILY     Psychiatry:  Antidepressants - SSRI Passed - 04/11/2021 12:57 PM      Passed - Completed PHQ-2 or PHQ-9 in the last 360 days      Passed - Valid encounter within last 6 months    Recent Outpatient Visits          3 months ago Routine general medical examination at a health care facility   [facility], [place] P, DO   9 months ago Recurrent major depressive disorder, in partial remission ([place])   [place], Megan P, DO   12 months ago Postmenopausal bleeding   [place] P, DO   1 year ago Recurrent major depressive disorder, in partial remission [facility])   [place], Megan P, DO   2 years ago Routine general medical examination at a health care facility   [place], Barb Merino, DO      Future Appointments            In 3 months Wynetta Emery, Barb Merino, DO [facility], [facility]

## 2021-04-11 NOTE — Telephone Encounter (Signed)
Patient requesting RX to be sent to mail order.

## 2021-04-28 ENCOUNTER — Ambulatory Visit
Admission: RE | Admit: 2021-04-28 | Discharge: 2021-04-28 | Disposition: A | Discharge status: Home / Self Care | Payer: Managed Care, Other (non HMO) | Source: Ambulatory Visit | Attending: Family Medicine | Admitting: Family Medicine

## 2021-04-28 ENCOUNTER — Other Ambulatory Visit: Payer: Self-pay

## 2021-04-28 DIAGNOSIS — Z1231 Encounter for screening mammogram for malignant neoplasm of breast: Secondary | ICD-10-CM | POA: Insufficient documentation

## 2021-06-05 ENCOUNTER — Other Ambulatory Visit: Payer: Self-pay | Admitting: Family Medicine

## 2021-06-06 NOTE — Telephone Encounter (Signed)
Requested Prescriptions  Pending Prescriptions Disp Refills   PARoxetine (PAXIL-CR) 12.5 MG 24 hr tablet [Pharmacy Med Name: PAROXETINE  12.5MG   TAB  EXTENDED RELEASE] 90 tablet 0    Sig: TAKE 1 TABLET BY MOUTH DAILY     Psychiatry:  Antidepressants - SSRI Passed - 06/05/2021 10:59 PM      Passed - Completed PHQ-2 or PHQ-9 in the last 360 days      Passed - Valid encounter within last 6 months    Recent Outpatient Visits          5 months ago Routine general medical examination at a health care facility   [facility], [place], DO   11 months ago Recurrent major depressive disorder, in partial remission ([place])   [place], Elsa, DO   1 year ago Postmenopausal bleeding   [place], Megan P, DO   1 year ago Recurrent major depressive disorder, in partial remission [facility])   [place], Megan P, DO   2 years ago Routine general medical examination at a health care facility   [place], [place], DO      Future Appointments            In 1 month Wynetta Emery, Barb Merino, DO [employer], [place]

## 2021-07-10 ENCOUNTER — Encounter: Payer: Self-pay | Admitting: Family Medicine

## 2021-07-10 ENCOUNTER — Ambulatory Visit
Admission: RE | Admit: 2021-07-10 | Discharge: 2021-07-10 | Disposition: A | Discharge status: Home / Self Care | Payer: Managed Care, Other (non HMO) | Attending: Family Medicine | Admitting: Family Medicine

## 2021-07-10 ENCOUNTER — Other Ambulatory Visit: Payer: Self-pay

## 2021-07-10 ENCOUNTER — Ambulatory Visit: Payer: Managed Care, Other (non HMO) | Admitting: Family Medicine

## 2021-07-10 ENCOUNTER — Ambulatory Visit
Admission: RE | Admit: 2021-07-10 | Discharge: 2021-07-10 | Disposition: A | Discharge status: Home / Self Care | Payer: Managed Care, Other (non HMO) | Source: Ambulatory Visit | Attending: Family Medicine | Admitting: Family Medicine

## 2021-07-10 VITALS — BP 115/70 | HR 62 | Temp 98.6°F | Wt 230.2 lb

## 2021-07-10 DIAGNOSIS — R32 Unspecified urinary incontinence: Secondary | ICD-10-CM

## 2021-07-10 DIAGNOSIS — G8929 Other chronic pain: Secondary | ICD-10-CM | POA: Insufficient documentation

## 2021-07-10 DIAGNOSIS — F3341 Major depressive disorder, recurrent, in partial remission: Secondary | ICD-10-CM | POA: Diagnosis not present

## 2021-07-10 DIAGNOSIS — M25551 Pain in right hip: Secondary | ICD-10-CM

## 2021-07-10 DIAGNOSIS — M25561 Pain in right knee: Secondary | ICD-10-CM | POA: Insufficient documentation

## 2021-07-10 DIAGNOSIS — M25562 Pain in left knee: Secondary | ICD-10-CM

## 2021-07-10 DIAGNOSIS — M545 Low back pain, unspecified: Secondary | ICD-10-CM | POA: Diagnosis present

## 2021-07-10 DIAGNOSIS — M25552 Pain in left hip: Secondary | ICD-10-CM | POA: Insufficient documentation

## 2021-07-10 DIAGNOSIS — R159 Full incontinence of feces: Secondary | ICD-10-CM

## 2021-07-10 DIAGNOSIS — Z90710 Acquired absence of both cervix and uterus: Secondary | ICD-10-CM

## 2021-07-10 DIAGNOSIS — M2550 Pain in unspecified joint: Secondary | ICD-10-CM

## 2021-07-10 NOTE — Assessment & Plan Note (Signed)
Will stop her paxil and see if weight goes down. If not improving, will consider saxenda.

## 2021-07-10 NOTE — Assessment & Plan Note (Signed)
Will cut down to every other day on her paxil with goal of weaning off. Recheck in about a month. Call with any concerns.

## 2021-07-10 NOTE — Progress Notes (Signed)
BP 115/70    Pulse 62    Temp 98.6 F (37 C) (Oral)    Wt 230 lb 3.2 oz (104.4 kg)    LMP  (LMP Unknown)    SpO2 99%    BMI 46.18 kg/m    Subjective:    Patient ID: Maureen King, female    DOB: Jun 13, 1955, 66 y.o.   MRN: [id number]  HPI: LAIAH King is a 66 y.o. female  Chief Complaint  Patient presents with   Depression   Since her hysterectomy she notes that things are very different. She notes for the last 3-4 months she has been in a lot of pain when laying in bed at night. Her back, hips and knees are just aching. It is aching and sore and moderate to severe. She notes it's worst first thing in the AM. Pain is in her hips, knees and back. No redness or swelling. No radiation of her pain. She does note that she sits in the same chair all the time and is not able to get up and move during the day.  Has been having incontinence of both urine and feces since her hysterectomy. Stress induced. Not happening every day, but it's very disturbing to   OBESITY Duration: chronic Previous attempts at weight loss: yes Complications of obesity: GERD, depression Peak weight: current 230 Weight loss goal: to be healthy Weight loss to date: none Requesting obesity pharmacotherapy: no Current weight loss supplements/medications: no Previous weight loss supplements/meds: no  DEPRESSION Mood status: stable Satisfied with current treatment?: yes Symptom severity: mild  Duration of current treatment : chronic Side effects: no Medication compliance: excellent compliance Psychotherapy/counseling: no  Previous psychiatric medications: paxil Depressed mood: no Anxious mood: no Anhedonia: no Significant weight loss or gain: no Insomnia: no  Fatigue: yes Feelings of worthlessness or guilt: no Impaired concentration/indecisiveness: no Suicidal ideations: no Hopelessness: no Crying spells: no Depression screen [facility] 2/9 07/10/2021 01/07/2021 06/25/2020 04/12/2020 11/28/2019   Decreased Interest 3 0 1 0 1  Down, Depressed, Hopeless 3 0 _0 PHQ - 2 Score 6 0 _1 Altered sleeping 1 0 _2 Tired, decreased energy 1 0 0 1 1  Change in appetite 1 1 0 0 1  Feeling bad or failure about yourself  0 0 0 0 0  Trouble concentrating 0 0 0 3 0  Moving slowly or fidgety/restless 0 0 0 0 0  Suicidal thoughts 0 0 0 0 0  PHQ-9 Score _3 Difficult doing work/chores Somewhat difficult Not difficult at all - Very difficult Somewhat difficult  Some recent data might be hidden    Relevant past medical, surgical, family and social history reviewed and updated as indicated. Interim medical history since our last visit reviewed. Allergies and medications reviewed and updated.  Review of Systems  Constitutional: Negative.   Respiratory: Negative.    Cardiovascular: Negative.   Gastrointestinal: Negative.   Musculoskeletal: Negative.   Neurological: Negative.   Psychiatric/Behavioral: Negative.     Per HPI unless specifically indicated above     Objective:    BP 115/70    Pulse 62    Temp 98.6 F (37 C) (Oral)    Wt 230 lb 3.2 oz (104.4 kg)    LMP  (LMP Unknown)    SpO2 99%    BMI 46.18 kg/m   Wt Readings from Last 3 Encounters:  07/10/21 230 lb 3.2 oz (  104.4 kg)  01/07/21 230 lb (104.3 kg)  11/14/20 226 lb (102.5 kg)    Physical Exam Vitals and nursing note reviewed.  Constitutional:      General: She is not in acute distress.    Appearance: Normal appearance. She is not ill-appearing, toxic-appearing or diaphoretic.  HENT:     Head: Normocephalic and atraumatic.     Right Ear: External ear normal.     Left Ear: External ear normal.     Nose: Nose normal.     Mouth/Throat:     Mouth: Mucous membranes are moist.     Pharynx: Oropharynx is clear.  Eyes:     General: No scleral icterus.       Right eye: No discharge.        Left eye: No discharge.     Extraocular Movements: Extraocular movements intact.     Conjunctiva/sclera: Conjunctivae  normal.     Pupils: Pupils are equal, round, and reactive to light.  Cardiovascular:     Rate and Rhythm: Normal rate and regular rhythm.     Pulses: Normal pulses.     Heart sounds: Normal heart sounds. No murmur heard.   No friction rub. No gallop.  Pulmonary:     Effort: Pulmonary effort is normal. No respiratory distress.     Breath sounds: Normal breath sounds. No stridor. No wheezing, rhonchi or rales.  Chest:     Chest wall: No tenderness.  Musculoskeletal:        General: Normal range of motion.     Cervical back: Normal range of motion and neck supple.  Skin:    General: Skin is warm and dry.     Capillary Refill: Capillary refill takes less than 2 seconds.     Coloration: Skin is not jaundiced or pale.     Findings: No bruising, erythema, lesion or rash.  Neurological:     General: No focal deficit present.     Mental Status: She is alert and oriented to person, place, and time. Mental status is at baseline.  Psychiatric:        Mood and Affect: Mood normal.        Behavior: Behavior normal.        Thought Content: Thought content normal.        Judgment: Judgment normal.    Results for orders placed or performed in visit on 01/07/21  CBC with Differential/Platelet  Result Value Ref Range   WBC 6.5 3.4 - 10.8 x10E3/uL   RBC 4.79 3.77 - 5.28 x10E6/uL   Hemoglobin 14.3 11.1 - 15.9 g/dL   Hematocrit 42.3 34.0 - 46.6 %   MCV 88 79 - 97 fL   MCH 29.9 26.6 - 33.0 pg   MCHC 33.8 31.5 - 35.7 g/dL   RDW 12.9 11.7 - 15.4 %   Platelets 300 150 - 450 x10E3/uL   Neutrophils 66 Not Estab. %   Lymphs 18 Not Estab. %   Monocytes 8 Not Estab. %   Eos 7 Not Estab. %   Basos 1 Not Estab. %   Neutrophils Absolute 4.3 1.4 - 7.0 x10E3/uL   Lymphocytes Absolute 1.2 0.7 - 3.1 x10E3/uL   Monocytes Absolute 0.5 0.1 - 0.9 x10E3/uL   EOS (ABSOLUTE) 0.5 (H) 0.0 - 0.4 x10E3/uL   Basophils Absolute 0.1 0.0 - 0.2 x10E3/uL   Immature Granulocytes 0 Not Estab. %   Immature Grans (Abs)  0.0 0.0 - 0.1 x10E3/uL  Comprehensive metabolic panel  Result  Value Ref Range   Glucose 93 65 - 99 mg/dL   BUN 17 8 - 27 mg/dL   Creatinine, Ser 0.79 0.57 - 1.00 mg/dL   eGFR 83 >59 mL/min/1.73   BUN/Creatinine Ratio 22 12 - 28   Sodium 141 134 - 144 mmol/L   Potassium 4.6 3.5 - 5.2 mmol/L   Chloride 102 96 - 106 mmol/L   CO2 23 20 - 29 mmol/L   Calcium 9.7 8.7 - 10.3 mg/dL   Total Protein 6.4 6.0 - 8.5 g/dL   Albumin 4.5 3.8 - 4.8 g/dL   Globulin, Total 1.9 1.5 - 4.5 g/dL   Albumin/Globulin Ratio 2.4 (H) 1.2 - 2.2   Bilirubin Total 0.3 0.0 - 1.2 mg/dL   Alkaline Phosphatase 116 44 - 121 IU/L   AST 21 0 - 40 IU/L   ALT 20 0 - 32 IU/L  Lipid Panel w/o Chol/HDL Ratio  Result Value Ref Range   Cholesterol, Total 239 (H) 100 - 199 mg/dL   Triglycerides 189 (H) 0 - 149 mg/dL   HDL 86 >39 mg/dL   VLDL Cholesterol Cal 32 5 - 40 mg/dL   LDL Chol Calc (NIH) 121 (H) 0 - 99 mg/dL  Urinalysis, Routine w reflex microscopic  Result Value Ref Range   Specific Gravity, UA 1.010 1.005 - 1.030   pH, UA 6.0 5.0 - 7.5   Color, UA Yellow Yellow   Appearance Ur Clear Clear   Leukocytes,UA Negative Negative   Protein,UA Negative Negative/Trace   Glucose, UA Negative Negative   Ketones, UA Negative Negative   RBC, UA Negative Negative   Bilirubin, UA Negative Negative   Urobilinogen, Ur 0.2 0.2 - 1.0 mg/dL   Nitrite, UA Negative Negative  TSH  Result Value Ref Range   TSH 1.330 0.450 - 4.500 uIU/mL      Assessment & Plan:   Problem List Items Addressed This Visit       Other   Morbid obesity ([place])    Will stop her paxil and see if weight goes down. If not improving, will consider saxenda.      Depression - Primary    Will cut down to every other day on her paxil with goal of weaning off. Recheck in about a month. Call with any concerns.       Other Visit Diagnoses     Chronic pain of both knees       Will check labs and x-rays. Await results. Treat as needed.    Relevant  Orders   DG Knee Complete 4 Views Right   DG Knee Complete 4 Views Left   Bilateral hip pain       Will check labs and x-rays. Await results. Treat as needed.    Relevant Orders   DG Hip Unilat W OR W/O Pelvis 2-3 Views Right   DG Hip Unilat W OR W/O Pelvis 2-3 Views Left   Acute bilateral low back pain without sciatica       Will check labs and x-rays. Await results. Treat as needed.    Relevant Orders   DG Lumbar Spine Complete   Arthralgia, unspecified joint       Will check labs and x-rays. Await results. Treat as needed.    Relevant Orders   TSH   Comprehensive metabolic panel   CBC with Differential/Platelet   VITAMIN D 25 Hydroxy (Vit-D Deficiency, Fractures)   Uric acid   Lyme Disease Serology w/Reflex   Rocky mtn spotted  fvr abs pnl(IgG+IgM)   Babesia microti Antibody Panel   Ehrlichia Antibody Panel   S/P hysterectomy       Having increased issues since hysterectomy. Will get her into pelvic PT. Call with any concerns. Continue to monitor.    Relevant Orders   Ambulatory referral to Physical Therapy   Urinary incontinence, unspecified type       Having increased issues since hysterectomy. Will get her into pelvic PT. Call with any concerns. Continue to monitor.    Relevant Orders   Ambulatory referral to Physical Therapy   Incontinence of feces, unspecified fecal incontinence type       Having increased issues since hysterectomy. Will get her into pelvic PT. Call with any concerns. Continue to monitor.    Relevant Orders   Ambulatory referral to Physical Therapy        Follow up plan: Return in about 4 weeks (around 08/07/2021).

## 2021-07-14 ENCOUNTER — Encounter: Payer: Self-pay | Admitting: Family Medicine

## 2021-07-15 LAB — COMPREHENSIVE METABOLIC PANEL
ALT: 18 IU/L (ref 0–32)
AST: 21 IU/L (ref 0–40)
Albumin/Globulin Ratio: 2.4 — ABNORMAL HIGH (ref 1.2–2.2)
Albumin: 4.4 g/dL (ref 3.8–4.8)
Alkaline Phosphatase: 109 IU/L (ref 44–121)
BUN/Creatinine Ratio: 18 (ref 12–28)
BUN: 14 mg/dL (ref 8–27)
Bilirubin Total: 0.4 mg/dL (ref 0.0–1.2)
CO2: 23 mmol/L (ref 20–29)
Calcium: 9.9 mg/dL (ref 8.7–10.3)
Chloride: 103 mmol/L (ref 96–106)
Creatinine, Ser: 0.76 mg/dL (ref 0.57–1.00)
Globulin, Total: 1.8 g/dL (ref 1.5–4.5)
Glucose: 90 mg/dL (ref 70–99)
Potassium: 4.5 mmol/L (ref 3.5–5.2)
Sodium: 142 mmol/L (ref 134–144)
Total Protein: 6.2 g/dL (ref 6.0–8.5)
eGFR: 87 mL/min/{1.73_m2} (ref >59)

## 2021-07-15 LAB — CBC WITH DIFFERENTIAL/PLATELET
Basophils Absolute: 0 10*3/uL (ref 0.0–0.2)
Basos: 1 %
EOS (ABSOLUTE): 0.3 10*3/uL (ref 0.0–0.4)
Eos: 6 %
Hematocrit: 42.7 % (ref 34.0–46.6)
Hemoglobin: 14.3 g/dL (ref 11.1–15.9)
Immature Grans (Abs): 0 10*3/uL (ref 0.0–0.1)
Immature Granulocytes: 0 %
Lymphocytes Absolute: 1.1 10*3/uL (ref 0.7–3.1)
Lymphs: 19 %
MCH: 30.2 pg (ref 26.6–33.0)
MCHC: 33.5 g/dL (ref 31.5–35.7)
MCV: 90 fL (ref 79–97)
Monocytes Absolute: 0.4 10*3/uL (ref 0.1–0.9)
Monocytes: 7 %
Neutrophils Absolute: 3.9 10*3/uL (ref 1.4–7.0)
Neutrophils: 67 %
Platelets: 277 10*3/uL (ref 150–450)
RBC: 4.73 x10E6/uL (ref 3.77–5.28)
RDW: 13.1 % (ref 11.7–15.4)
WBC: 5.8 10*3/uL (ref 3.4–10.8)

## 2021-07-15 LAB — LYME DISEASE SEROLOGY W/REFLEX: Lyme Total Antibody EIA: NEGATIVE

## 2021-07-15 LAB — EHRLICHIA ANTIBODY PANEL
E. Chaffeensis (HME) IgM Titer: NEGATIVE
E.Chaffeensis (HME) IgG: NEGATIVE
HGE IgG Titer: NEGATIVE
HGE IgM Titer: NEGATIVE

## 2021-07-15 LAB — URIC ACID: Uric Acid: 4.2 mg/dL (ref 3.0–7.2)

## 2021-07-15 LAB — BABESIA MICROTI ANTIBODY PANEL
Babesia microti IgG: <1:10 {titer}
Babesia microti IgM: <1:10 {titer}

## 2021-07-15 LAB — ROCKY MTN SPOTTED FVR ABS PNL(IGG+IGM)
RMSF IgG: NEGATIVE
RMSF IgM: 0.28 index (ref 0.00–0.89)

## 2021-07-15 LAB — VITAMIN D 25 HYDROXY (VIT D DEFICIENCY, FRACTURES): Vit D, 25-Hydroxy: 37.5 ng/mL (ref 30.0–100.0)

## 2021-07-15 LAB — TSH: TSH: 1.26 u[IU]/mL (ref 0.450–4.500)

## 2021-07-21 ENCOUNTER — Encounter: Payer: Self-pay | Admitting: Family Medicine

## 2021-07-21 ENCOUNTER — Other Ambulatory Visit: Payer: Self-pay | Admitting: Family Medicine

## 2021-07-21 DIAGNOSIS — G8929 Other chronic pain: Secondary | ICD-10-CM

## 2021-07-21 DIAGNOSIS — M25562 Pain in left knee: Secondary | ICD-10-CM

## 2021-07-21 DIAGNOSIS — M545 Low back pain, unspecified: Secondary | ICD-10-CM

## 2021-07-21 DIAGNOSIS — M25551 Pain in right hip: Secondary | ICD-10-CM

## 2021-08-07 ENCOUNTER — Encounter: Payer: Self-pay | Admitting: Family Medicine

## 2021-08-07 ENCOUNTER — Other Ambulatory Visit: Payer: Self-pay

## 2021-08-07 ENCOUNTER — Ambulatory Visit: Payer: Managed Care, Other (non HMO) | Admitting: Family Medicine

## 2021-08-07 VITALS — BP 107/68 | HR 66 | Temp 98.3°F | Ht 60.0 in | Wt 228.6 lb

## 2021-08-07 DIAGNOSIS — M79671 Pain in right foot: Secondary | ICD-10-CM | POA: Diagnosis not present

## 2021-08-07 DIAGNOSIS — G8929 Other chronic pain: Secondary | ICD-10-CM

## 2021-08-07 DIAGNOSIS — F3341 Major depressive disorder, recurrent, in partial remission: Secondary | ICD-10-CM

## 2021-08-07 MED ORDER — MELOXICAM 15 MG PO TABS: 15.0000 mg | ORAL_TABLET | Freq: Every day | ORAL | 1 refills | Status: DC

## 2021-08-07 MED ORDER — PAROXETINE HCL ER 12.5 MG PO TB24
12.5000 mg | ORAL_TABLET | Freq: Every day | ORAL | 1 refills | Status: DC
Start: 2021-08-07 — End: 2022-02-06

## 2021-08-07 NOTE — Assessment & Plan Note (Signed)
Congratulated patient on 2lb weight loss. Interested in starting [place] when her hips and back are a little more stable. Will continue diet and exercise. Will message when she's ready and we'll follow up 4-6 weeks after starting medicine.  ?

## 2021-08-07 NOTE — Progress Notes (Signed)
BP 107/68    Pulse 66    Temp 98.3 F (36.8 C)    Ht 5' (1.524 m)    Wt 228 lb 9.6 oz (103.7 kg)    LMP  (LMP Unknown)    SpO2 97%    BMI 44.65 kg/m    Subjective:    Patient ID: Maureen King, female    DOB: 1956-03-23, 66 y.o.   MRN: [id number]  HPI: Maureen King is a 66 y.o. female  Chief Complaint  Patient presents with   Depression    Patient has been taking paxil daily, needs refill today   Obesity   DEPRESSION Mood status: exacerbated Satisfied with current treatment?: yes Symptom severity: moderate  Duration of current treatment : chronic Side effects: no Medication compliance: excellent compliance Psychotherapy/counseling: no  Previous psychiatric medications: paxil Depressed mood: yes Anxious mood: yes Anhedonia: no Significant weight loss or gain: no Insomnia: no  Fatigue: yes Feelings of worthlessness or guilt: no Impaired concentration/indecisiveness: no Suicidal ideations: no Hopelessness: no Crying spells: no Depression screen [facility] 2/9 08/07/2021 07/10/2021 01/07/2021 06/25/2020 04/12/2020  Decreased Interest 0 3 0 1 0  Down, Depressed, Hopeless 0 3 0 1 1  PHQ - 2 Score 0 6 0 2 1  Altered sleeping 2 1 0 1 2  Tired, decreased energy 2 1 0 0 1  Change in appetite 1 1 1  0 0  Feeling bad or failure about yourself  0 0 0 0 0  Trouble concentrating 0 0 0 0 3  Moving slowly or fidgety/restless 0 0 0 0 0  Suicidal thoughts 0 0 0 0 0  PHQ-9 Score 5 9 1 3 7   Difficult doing work/chores - Somewhat difficult Not difficult at all - Very difficult  Some recent data might be hidden   OBESITY- thinking that she may want to start saxenda, but is not sure if she wants to until her back and hip are doing a little better.  Duration: chronic Previous attempts at weight loss: yes Complications of obesity:  Peak weight: 230 Weight loss goal: to be healthy  Weight loss to date: 2 lbs Requesting obesity pharmacotherapy: no Current weight loss  supplements/medications: no Previous weight loss supplements/meds: no  Relevant past medical, surgical, family and social history reviewed and updated as indicated. Interim medical history since our last visit reviewed. Allergies and medications reviewed and updated.  Review of Systems  Constitutional: Negative.   Respiratory: Negative.    Cardiovascular: Negative.   Gastrointestinal: Negative.   Musculoskeletal:  Positive for myalgias. Negative for arthralgias, back pain, gait problem, joint swelling, neck pain and neck stiffness.  Skin: Negative.   Neurological: Negative.   Psychiatric/Behavioral: Negative.     Per HPI unless specifically indicated above     Objective:    BP 107/68    Pulse 66    Temp 98.3 F (36.8 C)    Ht 5' (1.524 m)    Wt 228 lb 9.6 oz (103.7 kg)    LMP  (LMP Unknown)    SpO2 97%    BMI 44.65 kg/m   Wt Readings from Last 3 Encounters:  08/07/21 228 lb 9.6 oz (103.7 kg)  07/10/21 230 lb 3.2 oz (104.4 kg)  01/07/21 230 lb (104.3 kg)    Physical Exam Vitals and nursing note reviewed.  Constitutional:      General: She is not in acute distress.    Appearance: Normal appearance. She is not ill-appearing, toxic-appearing or diaphoretic.  HENT:     Head: Normocephalic and atraumatic.     Right Ear: External ear normal.     Left Ear: External ear normal.     Nose: Nose normal.     Mouth/Throat:     Mouth: Mucous membranes are moist.     Pharynx: Oropharynx is clear.  Eyes:     General: No scleral icterus.       Right eye: No discharge.        Left eye: No discharge.     Extraocular Movements: Extraocular movements intact.     Conjunctiva/sclera: Conjunctivae normal.     Pupils: Pupils are equal, round, and reactive to light.  Cardiovascular:     Rate and Rhythm: Normal rate and regular rhythm.     Pulses: Normal pulses.     Heart sounds: Normal heart sounds. No murmur heard.   No friction rub. No gallop.  Pulmonary:     Effort: Pulmonary effort is  normal. No respiratory distress.     Breath sounds: Normal breath sounds. No stridor. No wheezing, rhonchi or rales.  Chest:     Chest wall: No tenderness.  Musculoskeletal:        General: Tenderness (posterior aspect R heel) present. Normal range of motion.     Cervical back: Normal range of motion and neck supple.  Skin:    General: Skin is warm and dry.     Capillary Refill: Capillary refill takes less than 2 seconds.     Coloration: Skin is not jaundiced or pale.     Findings: No bruising, erythema, lesion or rash.  Neurological:     General: No focal deficit present.     Mental Status: She is alert and oriented to person, place, and time. Mental status is at baseline.  Psychiatric:        Mood and Affect: Mood normal.        Behavior: Behavior normal.        Thought Content: Thought content normal.        Judgment: Judgment normal.    Results for orders placed or performed in visit on 07/10/21  TSH  Result Value Ref Range   TSH 1.260 0.450 - 4.500 uIU/mL  Comprehensive metabolic panel  Result Value Ref Range   Glucose 90 70 - 99 mg/dL   BUN 14 8 - 27 mg/dL   Creatinine, Ser 0.76 0.57 - 1.00 mg/dL   eGFR 87 >59 mL/min/1.73   BUN/Creatinine Ratio 18 12 - 28   Sodium 142 134 - 144 mmol/L   Potassium 4.5 3.5 - 5.2 mmol/L   Chloride 103 96 - 106 mmol/L   CO2 23 20 - 29 mmol/L   Calcium 9.9 8.7 - 10.3 mg/dL   Total Protein 6.2 6.0 - 8.5 g/dL   Albumin 4.4 3.8 - 4.8 g/dL   Globulin, Total 1.8 1.5 - 4.5 g/dL   Albumin/Globulin Ratio 2.4 (H) 1.2 - 2.2   Bilirubin Total 0.4 0.0 - 1.2 mg/dL   Alkaline Phosphatase 109 44 - 121 IU/L   AST 21 0 - 40 IU/L   ALT 18 0 - 32 IU/L  CBC with Differential/Platelet  Result Value Ref Range   WBC 5.8 3.4 - 10.8 x10E3/uL   RBC 4.73 3.77 - 5.28 x10E6/uL   Hemoglobin 14.3 11.1 - 15.9 g/dL   Hematocrit 42.7 34.0 - 46.6 %   MCV 90 79 - 97 fL   MCH 30.2 26.6 - 33.0 pg   MCHC 33.5  31.5 - 35.7 g/dL   RDW 13.1 11.7 - 15.4 %   Platelets  277 150 - 450 x10E3/uL   Neutrophils 67 Not Estab. %   Lymphs 19 Not Estab. %   Monocytes 7 Not Estab. %   Eos 6 Not Estab. %   Basos 1 Not Estab. %   Neutrophils Absolute 3.9 1.4 - 7.0 x10E3/uL   Lymphocytes Absolute 1.1 0.7 - 3.1 x10E3/uL   Monocytes Absolute 0.4 0.1 - 0.9 x10E3/uL   EOS (ABSOLUTE) 0.3 0.0 - 0.4 x10E3/uL   Basophils Absolute 0.0 0.0 - 0.2 x10E3/uL   Immature Granulocytes 0 Not Estab. %   Immature Grans (Abs) 0.0 0.0 - 0.1 x10E3/uL  VITAMIN D 25 Hydroxy (Vit-D Deficiency, Fractures)  Result Value Ref Range   Vit D, 25-Hydroxy 37.5 30.0 - 100.0 ng/mL  Uric acid  Result Value Ref Range   Uric Acid 4.2 3.0 - 7.2 mg/dL  Lyme Disease Serology w/Reflex  Result Value Ref Range   Lyme Total Antibody EIA Negative Negative  Rocky mtn spotted fvr abs pnl(IgG+IgM)  Result Value Ref Range   RMSF IgG Negative Negative   RMSF IgM 0.28 0.00 - 0.89 index  Babesia microti Antibody Panel  Result Value Ref Range   Babesia microti IgM <1:10 Neg:<1:10   Babesia microti IgG <7:90 WIO:<9:73  Ehrlichia Antibody Panel  Result Value Ref Range   E.Chaffeensis (HME) IgG Negative Neg:<1:64   E. Chaffeensis (HME) IgM Titer Negative Neg:<1:20   HGE IgG Titer Negative Neg:<1:64   HGE IgM Titer Negative Neg:<1:20      Assessment & Plan:   Problem List Items Addressed This Visit       Other   Morbid obesity ([place])    Congratulated patient on 2lb weight loss. Interested in starting [place] when her hips and back are a little more stable. Will continue diet and exercise. Will message when she's ready and we'll follow up 4-6 weeks after starting medicine.       Depression - Primary    Stable. Increased responsibilities at work. Will continue paxil. Continue to monitor.       Relevant Medications   PARoxetine (PAXIL-CR) 12.5 MG 24 hr tablet   Other Visit Diagnoses     Chronic heel pain, right       Seeing ortho for hip and back shortly. Will let us know if she needs a referral for  podiatry.   Relevant Medications   PARoxetine (PAXIL-CR) 12.5 MG 24 hr tablet   meloxicam (MOBIC) 15 MG tablet        Follow up plan: Return in about 6 months (around 02/07/2022) for for physical (if starts saxenda, will follow up 4-6 weeks after starts it).

## 2021-08-07 NOTE — Assessment & Plan Note (Signed)
Stable. Increased responsibilities at work. Will continue paxil. Continue to monitor.  ?

## 2021-08-11 DIAGNOSIS — M7660 Achilles tendinitis, unspecified leg: Secondary | ICD-10-CM | POA: Insufficient documentation

## 2021-08-11 DIAGNOSIS — M170 Bilateral primary osteoarthritis of knee: Secondary | ICD-10-CM | POA: Insufficient documentation

## 2021-08-11 DIAGNOSIS — M7071 Other bursitis of hip, right hip: Secondary | ICD-10-CM | POA: Insufficient documentation

## 2021-10-02 ENCOUNTER — Encounter: Payer: Self-pay | Admitting: Nurse Practitioner

## 2021-10-02 ENCOUNTER — Ambulatory Visit: Payer: Managed Care, Other (non HMO) | Admitting: Nurse Practitioner

## 2021-10-02 VITALS — BP 119/77 | HR 83 | Temp 97.8°F | Wt 232.6 lb

## 2021-10-02 DIAGNOSIS — L02219 Cutaneous abscess of trunk, unspecified: Secondary | ICD-10-CM | POA: Diagnosis not present

## 2021-10-02 DIAGNOSIS — Z23 Encounter for immunization: Secondary | ICD-10-CM | POA: Diagnosis not present

## 2021-10-02 MED ORDER — CEPHALEXIN 500 MG PO CAPS: 500.0000 mg | ORAL_CAPSULE | Freq: Four times a day (QID) | ORAL | 0 refills | Status: DC

## 2021-10-02 NOTE — Progress Notes (Signed)
? ?BP 119/77   Pulse 83   Temp 97.8 ?F (36.6 ?C) (Oral)   Wt 232 lb 9.6 oz (105.5 kg)   LMP  (LMP Unknown)   BMI 45.43 kg/m?   ? ?Subjective:  ? ? Patient ID: Maureen King, female    DOB: 01-08-1956, 66 y.o.   MRN: [id number] ? ?HPI: ?Maureen King is a 66 y.o. female ? ?Chief Complaint  ?Patient presents with  ? Recurrent Skin Infections  ?  On upper back x 1 week. Has tried essential oils for relief- only temporary pain relief per patient.   ? ?On upper back x 1 week. Has tried essential oils for relief- only temporary pain relief per patient.  The essential oils helped last night but it is worse this morning.  Denies fever.   ? ?She may have had a little discharge this morning. Has had one similar one before.  ? ? ?Relevant past medical, surgical, family and social history reviewed and updated as indicated. Interim medical history since our last visit reviewed. ?Allergies and medications reviewed and updated. ? ?Review of Systems  ?Skin:   ?     Boil on back  ? ?Per HPI unless specifically indicated above ? ?   ?Objective:  ?  ?BP 119/77   Pulse 83   Temp 97.8 ?F (36.6 ?C) (Oral)   Wt 232 lb 9.6 oz (105.5 kg)   LMP  (LMP Unknown)   BMI 45.43 kg/m?   ?Wt Readings from Last 3 Encounters:  ?10/02/21 232 lb 9.6 oz (105.5 kg)  ?08/07/21 228 lb 9.6 oz (103.7 kg)  ?07/10/21 230 lb 3.2 oz (104.4 kg)  ?  ?Physical Exam ?Vitals and nursing note reviewed.  ?Constitutional:   ?   General: She is not in acute distress. ?   Appearance: Normal appearance. She is normal weight. She is not ill-appearing, toxic-appearing or diaphoretic.  ?HENT:  ?   Head: Normocephalic.  ?   Right Ear: External ear normal.  ?   Left Ear: External ear normal.  ?   Nose: Nose normal.  ?   Mouth/Throat:  ?   Mouth: Mucous membranes are moist.  ?   Pharynx: Oropharynx is clear.  ?Eyes:  ?   General:     ?   Right eye: No discharge.     ?   Left eye: No discharge.  ?   Extraocular Movements: Extraocular movements intact.  ?    Conjunctiva/sclera: Conjunctivae normal.  ?   Pupils: Pupils are equal, round, and reactive to light.  ?Cardiovascular:  ?   Rate and Rhythm: Normal rate and regular rhythm.  ?   Heart sounds: No murmur heard. ?Pulmonary:  ?   Effort: Pulmonary effort is normal. No respiratory distress.  ?   Breath sounds: Normal breath sounds. No wheezing or rales.  ?Musculoskeletal:  ?   Cervical back: Normal range of motion and neck supple.  ?Skin: ?   General: Skin is warm and dry.  ?   Capillary Refill: Capillary refill takes less than 2 seconds.  ? ?    ?Neurological:  ?   General: No focal deficit present.  ?   Mental Status: She is alert and oriented to person, place, and time. Mental status is at baseline.  ?Psychiatric:     ?   Mood and Affect: Mood normal.     ?   Behavior: Behavior normal.     ?   Thought Content: Thought content  normal.     ?   Judgment: Judgment normal.  ? ? ?Results for orders placed or performed in visit on 07/10/21  ?TSH  ?Result Value Ref Range  ? TSH 1.260 0.450 - 4.500 uIU/mL  ?Comprehensive metabolic panel  ?Result Value Ref Range  ? Glucose 90 70 - 99 mg/dL  ? BUN 14 8 - 27 mg/dL  ? Creatinine, Ser 0.76 0.57 - 1.00 mg/dL  ? eGFR 87 >59 mL/min/1.73  ? BUN/Creatinine Ratio 18 12 - 28  ? Sodium 142 134 - 144 mmol/L  ? Potassium 4.5 3.5 - 5.2 mmol/L  ? Chloride 103 96 - 106 mmol/L  ? CO2 23 20 - 29 mmol/L  ? Calcium 9.9 8.7 - 10.3 mg/dL  ? Total Protein 6.2 6.0 - 8.5 g/dL  ? Albumin 4.4 3.8 - 4.8 g/dL  ? Globulin, Total 1.8 1.5 - 4.5 g/dL  ? Albumin/Globulin Ratio 2.4 (H) 1.2 - 2.2  ? Bilirubin Total 0.4 0.0 - 1.2 mg/dL  ? Alkaline Phosphatase 109 44 - 121 IU/L  ? AST 21 0 - 40 IU/L  ? ALT 18 0 - 32 IU/L  ?CBC with Differential/Platelet  ?Result Value Ref Range  ? WBC 5.8 3.4 - 10.8 x10E3/uL  ? RBC 4.73 3.77 - 5.28 x10E6/uL  ? Hemoglobin 14.3 11.1 - 15.9 g/dL  ? Hematocrit 42.7 34.0 - 46.6 %  ? MCV 90 79 - 97 fL  ? MCH 30.2 26.6 - 33.0 pg  ? MCHC 33.5 31.5 - 35.7 g/dL  ? RDW 13.1 11.7 - 15.4 %  ?  Platelets 277 150 - 450 x10E3/uL  ? Neutrophils 67 Not Estab. %  ? Lymphs 19 Not Estab. %  ? Monocytes 7 Not Estab. %  ? Eos 6 Not Estab. %  ? Basos 1 Not Estab. %  ? Neutrophils Absolute 3.9 1.4 - 7.0 x10E3/uL  ? Lymphocytes Absolute 1.1 0.7 - 3.1 x10E3/uL  ? Monocytes Absolute 0.4 0.1 - 0.9 x10E3/uL  ? EOS (ABSOLUTE) 0.3 0.0 - 0.4 x10E3/uL  ? Basophils Absolute 0.0 0.0 - 0.2 x10E3/uL  ? Immature Granulocytes 0 Not Estab. %  ? Immature Grans (Abs) 0.0 0.0 - 0.1 x10E3/uL  ?VITAMIN D 25 Hydroxy (Vit-D Deficiency, Fractures)  ?Result Value Ref Range  ? Vit D, 25-Hydroxy 37.5 30.0 - 100.0 ng/mL  ?Uric acid  ?Result Value Ref Range  ? Uric Acid 4.2 3.0 - 7.2 mg/dL  ?Lyme Disease Serology w/Reflex  ?Result Value Ref Range  ? Lyme Total Antibody EIA Negative Negative  ?Rocky mtn spotted fvr abs pnl(IgG+IgM)  ?Result Value Ref Range  ? RMSF IgG Negative Negative  ? RMSF IgM 0.28 0.00 - 0.89 index  ?Babesia microti Antibody Panel  ?Result Value Ref Range  ? Babesia microti IgM <1:10 Neg:<1:10  ? Babesia microti IgG <1:10 Neg:<1:10  ?Ehrlichia Antibody Panel  ?Result Value Ref Range  ? E.Chaffeensis (HME) IgG Negative Neg:<1:64  ? E. Chaffeensis (HME) IgM Titer Negative Neg:<1:20  ? HGE IgG Titer Negative Neg:<1:64  ? HGE IgM Titer Negative Neg:<1:20  ? ?   ?Assessment & Plan:  ? ?Problem List Items Addressed This Visit   ?None ?Visit Diagnoses   ? ? Cutaneous abscess of trunk, unspecified site of trunk    -  Primary  ? Treat with Keflex QID.  Recommend warm compress.  Tylenol/ibuprofen for pain. Discussed signs and symptoms to follow up for. May need to drain if not improved.  ? Need for shingles vaccine      ?  Relevant Orders  ? Varicella-zoster vaccine IM (Shingrix) (Completed)  ? ?  ?  ? ?Follow up plan: ?Return if symptoms worsen or fail to improve. ? ? ? ? ? ?

## 2021-12-03 ENCOUNTER — Other Ambulatory Visit: Payer: Self-pay | Admitting: Family Medicine

## 2021-12-04 NOTE — Telephone Encounter (Signed)
Requested Prescriptions  Pending Prescriptions Disp Refills  . meloxicam (MOBIC) 15 MG tablet [Pharmacy Med Name: Meloxicam 15 MG Oral Tablet] 90 tablet 0    Sig: TAKE 1 TABLET BY MOUTH DAILY     Analgesics:  COX2 Inhibitors Failed - 12/03/2021 10:52 PM      Failed - Manual Review: Labs are only required if the patient has taken medication for more than 8 weeks.      Passed - HGB in normal range and within 360 days    Hemoglobin  Date Value Ref Range Status  07/10/2021 14.3 11.1 - 15.9 g/dL Final         Passed - Cr in normal range and within 360 days    Creatinine, Ser  Date Value Ref Range Status  07/10/2021 0.76 0.57 - 1.00 mg/dL Final         Passed - HCT in normal range and within 360 days    Hematocrit  Date Value Ref Range Status  07/10/2021 42.7 34.0 - 46.6 % Final         Passed - AST in normal range and within 360 days    AST  Date Value Ref Range Status  07/10/2021 21 0 - 40 IU/L Final         Passed - ALT in normal range and within 360 days    ALT  Date Value Ref Range Status  07/10/2021 18 0 - 32 IU/L Final         Passed - eGFR is 30 or above and within 360 days    GFR calc Af Amer  Date Value Ref Range Status  04/12/2020 107 >59 mL/min/1.73 Final    Comment:    **In accordance with recommendations from the NKF-ASN Task force,**   [facility] is in the process of updating its eGFR calculation to the   2021 CKD-EPI creatinine equation that estimates kidney function   without a race variable.    GFR, Estimated  Date Value Ref Range Status  08/26/2020 >60 >60 mL/min Final    Comment:    (NOTE) Calculated using the CKD-EPI Creatinine Equation (2021)    eGFR  Date Value Ref Range Status  07/10/2021 87 >59 mL/min/1.73 Final         Passed - Patient is not pregnant      Passed - Valid encounter within last 12 months    Recent Outpatient Visits          2 months ago Cutaneous abscess of trunk, unspecified site of trunk   [place], Santiago Glad, NP   3 months ago Recurrent major depressive disorder, in partial remission ([place])   [place], Megan P, DO   4 months ago Recurrent major depressive disorder, in partial remission ([place])   [place], Megan P, DO   11 months ago Routine general medical examination at a health care facility   [facility], [place], DO   1 year ago Recurrent major depressive disorder, in partial remission [facility])   [place], [place], DO      Future Appointments            In 2 months Wynetta Emery, Barb Merino, DO [facility], [facility]

## 2022-01-05 ENCOUNTER — Ambulatory Visit (INDEPENDENT_AMBULATORY_CARE_PROVIDER_SITE_OTHER): Payer: No Typology Code available for payment source | Admitting: Family Medicine

## 2022-01-05 ENCOUNTER — Encounter: Payer: Self-pay | Admitting: Family Medicine

## 2022-01-05 VITALS — BP 133/72 | HR 61 | Temp 98.2°F | Ht 60.0 in | Wt 230.4 lb

## 2022-01-05 DIAGNOSIS — M2240 Chondromalacia patellae, unspecified knee: Secondary | ICD-10-CM | POA: Insufficient documentation

## 2022-01-05 DIAGNOSIS — M170 Bilateral primary osteoarthritis of knee: Secondary | ICD-10-CM

## 2022-01-05 DIAGNOSIS — M7660 Achilles tendinitis, unspecified leg: Secondary | ICD-10-CM | POA: Diagnosis not present

## 2022-01-05 DIAGNOSIS — Z23 Encounter for immunization: Secondary | ICD-10-CM

## 2022-01-05 MED ORDER — TRIAMCINOLONE ACETONIDE 0.5 % EX OINT: 1.0000 | TOPICAL_OINTMENT | Freq: Two times a day (BID) | CUTANEOUS | 0 refills | Status: DC

## 2022-01-05 NOTE — Progress Notes (Signed)
BP 133/72   Pulse 61   Temp 98.2 F (36.8 C) (Oral)   Ht 5' (1.524 m)   Wt 230 lb 6.4 oz (104.5 kg)   LMP  (LMP Unknown)   SpO2 100%   BMI 45.00 kg/m    Subjective:    Patient ID: Maureen King, female    DOB: 06-01-1956, 66 y.o.   MRN: [id number]  HPI: Maureen King is a 66 y.o. female  Chief Complaint  Patient presents with  . Medication Problem    Patient says she stopped taking Meloxicam due to stomach issues.  . Referral    Patient says she would like to discuss a different referral to Orthopedic or Podiatrist office as she is still having issues with her foot.    Having abdominal pain on the meloxicam. Has been fee  OBESITY Duration:  Previous attempts at weight loss: {Blank WSFKCL:27517::"GYF","VC"} Complications of obesity:  Peak weight:  Weight loss goal:  Weight loss to date:  Requesting obesity pharmacotherapy: {Blank single:19197::"yes","no"} Current weight loss supplements/medications: {Blank single:19197::"yes","no"} Previous weight loss supplements/meds: {Blank single:19197::"yes","no"} Calories:   Relevant past medical, surgical, family and social history reviewed and updated as indicated. Interim medical history since our last visit reviewed. Allergies and medications reviewed and updated.  Review of Systems  Constitutional: Negative.   Respiratory: Negative.    Cardiovascular: Negative.   Gastrointestinal: Negative.   Musculoskeletal:  Positive for arthralgias and myalgias. Negative for back pain, gait problem, joint swelling, neck pain and neck stiffness.  Skin: Negative.   Neurological: Negative.   Psychiatric/Behavioral: Negative.      Per HPI unless specifically indicated above     Objective:    BP 133/72   Pulse 61   Temp 98.2 F (36.8 C) (Oral)   Ht 5' (1.524 m)   Wt 230 lb 6.4 oz (104.5 kg)   LMP  (LMP Unknown)   SpO2 100%   BMI 45.00 kg/m   Wt Readings from Last 3 Encounters:  01/05/22 230 lb 6.4 oz (104.5  kg)  10/02/21 232 lb 9.6 oz (105.5 kg)  08/07/21 228 lb 9.6 oz (103.7 kg)    Physical Exam Vitals and nursing note reviewed.  Constitutional:      General: She is not in acute distress.    Appearance: Normal appearance. She is obese. She is not ill-appearing, toxic-appearing or diaphoretic.  HENT:     Head: Normocephalic and atraumatic.     Right Ear: External ear normal.     Left Ear: External ear normal.     Nose: Nose normal.     Mouth/Throat:     Mouth: Mucous membranes are moist.     Pharynx: Oropharynx is clear.  Eyes:     General: No scleral icterus.       Right eye: No discharge.        Left eye: No discharge.     Extraocular Movements: Extraocular movements intact.     Conjunctiva/sclera: Conjunctivae normal.     Pupils: Pupils are equal, round, and reactive to light.  Cardiovascular:     Rate and Rhythm: Normal rate and regular rhythm.     Pulses: Normal pulses.     Heart sounds: Normal heart sounds. No murmur heard.    No friction rub. No gallop.  Pulmonary:     Effort: Pulmonary effort is normal. No respiratory distress.     Breath sounds: Normal breath sounds. No stridor. No wheezing, rhonchi or rales.  Chest:  Chest wall: No tenderness.  Musculoskeletal:        General: Normal range of motion.     Cervical back: Normal range of motion and neck supple.  Skin:    General: Skin is warm and dry.     Capillary Refill: Capillary refill takes less than 2 seconds.     Coloration: Skin is not jaundiced or pale.     Findings: No bruising, erythema, lesion or rash.  Neurological:     General: No focal deficit present.     Mental Status: She is alert and oriented to person, place, and time. Mental status is at baseline.  Psychiatric:        Mood and Affect: Mood normal.        Behavior: Behavior normal.        Thought Content: Thought content normal.        Judgment: Judgment normal.    Results for orders placed or performed in visit on 07/10/21  TSH  Result  Value Ref Range   TSH 1.260 0.450 - 4.500 uIU/mL  Comprehensive metabolic panel  Result Value Ref Range   Glucose 90 70 - 99 mg/dL   BUN 14 8 - 27 mg/dL   Creatinine, Ser 0.76 0.57 - 1.00 mg/dL   eGFR 87 >59 mL/min/1.73   BUN/Creatinine Ratio 18 12 - 28   Sodium 142 134 - 144 mmol/L   Potassium 4.5 3.5 - 5.2 mmol/L   Chloride 103 96 - 106 mmol/L   CO2 23 20 - 29 mmol/L   Calcium 9.9 8.7 - 10.3 mg/dL   Total Protein 6.2 6.0 - 8.5 g/dL   Albumin 4.4 3.8 - 4.8 g/dL   Globulin, Total 1.8 1.5 - 4.5 g/dL   Albumin/Globulin Ratio 2.4 (H) 1.2 - 2.2   Bilirubin Total 0.4 0.0 - 1.2 mg/dL   Alkaline Phosphatase 109 44 - 121 IU/L   AST 21 0 - 40 IU/L   ALT 18 0 - 32 IU/L  CBC with Differential/Platelet  Result Value Ref Range   WBC 5.8 3.4 - 10.8 x10E3/uL   RBC 4.73 3.77 - 5.28 x10E6/uL   Hemoglobin 14.3 11.1 - 15.9 g/dL   Hematocrit 42.7 34.0 - 46.6 %   MCV 90 79 - 97 fL   MCH 30.2 26.6 - 33.0 pg   MCHC 33.5 31.5 - 35.7 g/dL   RDW 13.1 11.7 - 15.4 %   Platelets 277 150 - 450 x10E3/uL   Neutrophils 67 Not Estab. %   Lymphs 19 Not Estab. %   Monocytes 7 Not Estab. %   Eos 6 Not Estab. %   Basos 1 Not Estab. %   Neutrophils Absolute 3.9 1.4 - 7.0 x10E3/uL   Lymphocytes Absolute 1.1 0.7 - 3.1 x10E3/uL   Monocytes Absolute 0.4 0.1 - 0.9 x10E3/uL   EOS (ABSOLUTE) 0.3 0.0 - 0.4 x10E3/uL   Basophils Absolute 0.0 0.0 - 0.2 x10E3/uL   Immature Granulocytes 0 Not Estab. %   Immature Grans (Abs) 0.0 0.0 - 0.1 x10E3/uL  VITAMIN D 25 Hydroxy (Vit-D Deficiency, Fractures)  Result Value Ref Range   Vit D, 25-Hydroxy 37.5 30.0 - 100.0 ng/mL  Uric acid  Result Value Ref Range   Uric Acid 4.2 3.0 - 7.2 mg/dL  Lyme Disease Serology w/Reflex  Result Value Ref Range   Lyme Total Antibody EIA Negative Negative  Rocky mtn spotted fvr abs pnl(IgG+IgM)  Result Value Ref Range   RMSF IgG Negative Negative   RMSF IgM  0.28 0.00 - 0.89 index  Babesia microti Antibody Panel  Result Value Ref Range    Babesia microti IgM <1:10 Neg:<1:10   Babesia microti IgG <3:14 HFW:<2:63  Ehrlichia Antibody Panel  Result Value Ref Range   E.Chaffeensis (HME) IgG Negative Neg:<1:64   E. Chaffeensis (HME) IgM Titer Negative Neg:<1:20   HGE IgG Titer Negative Neg:<1:64   HGE IgM Titer Negative Neg:<1:20      Assessment & Plan:   Problem List Items Addressed This Visit   None    Follow up plan: No follow-ups on file.

## 2022-01-05 NOTE — Patient Instructions (Signed)
[facility]

## 2022-01-07 ENCOUNTER — Encounter: Payer: Self-pay | Admitting: Family Medicine

## 2022-01-07 NOTE — Assessment & Plan Note (Signed)
Will get her into ortho. Referral generated today. OK to continue meloxicam PRN. Call with any concerns. Continue to monitor.

## 2022-01-07 NOTE — Assessment & Plan Note (Signed)
Will check on coverage for wegovy and saxenda. Will let us know if she would like to start something. Continue to monitor.

## 2022-01-13 ENCOUNTER — Ambulatory Visit: Payer: Self-pay

## 2022-01-13 ENCOUNTER — Ambulatory Visit (INDEPENDENT_AMBULATORY_CARE_PROVIDER_SITE_OTHER): Payer: No Typology Code available for payment source | Admitting: Surgery

## 2022-01-13 DIAGNOSIS — M25571 Pain in right ankle and joints of right foot: Secondary | ICD-10-CM | POA: Diagnosis not present

## 2022-01-13 DIAGNOSIS — M7661 Achilles tendinitis, right leg: Secondary | ICD-10-CM | POA: Diagnosis not present

## 2022-01-13 NOTE — Progress Notes (Signed)
Office Visit Note   Patient: Maureen King           Date of Birth: 1955/08/15           MRN: [id number] Visit Date: 01/13/2022              Requested by: Valerie Roys, DO [place],  [place] [address] PCP: Valerie Roys, DO   Assessment & Plan: Visit Diagnoses:  1. Pain in right ankle and joints of right foot   2. Achilles tendinitis, right leg     Plan: With patient's ongoing pain and findings on x-ray I will order MRI scan to better evaluate her Achilles tendon.  Follow-up with Dr. Sharol Given in a couple weeks to discuss results and treatment options.  Patient is put in a cam walker.  Follow-Up Instructions: Return in about 2 weeks (around 01/27/2022) for WITH DR DUDA (PER Orry Sigl) TO REVIEW RIGHT ANKLE MRI AND DISCUSS POSSIBLE SURGICAL INTERVENTION.   Orders:  Orders Placed This Encounter  Procedures   XR Ankle Complete Right   MR Ankle Right w/o contrast   No orders of the defined types were placed in this encounter.     Procedures: No procedures performed   Clinical Data: No additional findings.   Subjective: Chief Complaint  Patient presents with   Right Ankle - Pain    HPI 66 year old white female comes in today with complaints of right posterior heel pain.  Pain off-and-on for years.  Has been told in the past that has plantar fasciitis.  Patient has also worn a cam boot in the past for treatment.  Pain when she is ambulating. Review of Systems No current complaints of cardiopulmonary GI/GU issues  Objective: Vital Signs: LMP  (LMP Unknown)   Physical Exam HENT:     Head: Normocephalic.     Nose: Nose normal.  Musculoskeletal:     Comments: Gait is antalgic.  Patient is marked tenderness at the Achilles tendon calcaneal insertion.  Neurological:     General: No focal deficit present.     Mental Status: She is alert.     Ortho Exam  Specialty Comments:  No specialty comments available.  Imaging: No results found.   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   Endometrial hyperplasia without atypia    Endometrial polyp    Bradycardia 03/08/2017   Morbid obesity ([place]) 02/12/2015   Depression 02/12/2015   Insomnia 02/12/2015   GERD (gastroesophageal reflux disease) 02/12/2015   Past Medical History:  Diagnosis Date   Anxiety    Depression    GERD (gastroesophageal reflux disease)    Insomnia    Obesity    Palpitations     Family History  Problem Relation Age of Onset   Hypertension Mother    Osteoporosis Mother    Heart disease Father 87       Stent   Lung disease Father    Idiopathic pulmonary fibrosis Father    Hypertension Brother    Cancer Maternal Grandmother        gallbladder   Heart disease Paternal Grandfather        MI   Breast cancer Neg Hx     Past Surgical History:  Procedure Laterality Date   COLONOSCOPY     COLPOSCOPY N/A 05/30/2020  Procedure: COLPOSCOPY;  Surgeon: Homero Fellers, MD;  Location: [facility];  Service: Gynecology;  Laterality: N/A;   CYSTOSCOPY N/A 08/29/2020   Procedure: CYSTOSCOPY;  Surgeon: Homero Fellers, MD;  Location: [facility];  Service: Gynecology;  Laterality: N/A;   HYSTEROSCOPY WITH D & C N/A 05/30/2020   Procedure: DILATATION AND CURETTAGE /HYSTEROSCOPY;  Surgeon: Homero Fellers, MD;  Location: [facility];  Service: Gynecology;  Laterality: N/A;   NO PAST SURGERIES     ROBOTIC ASSISTED LAPAROSCOPIC HYSTERECTOMY AND SALPINGECTOMY Bilateral 08/29/2020   Procedure: XI ROBOTIC ASSISTED LAPAROSCOPIC HYSTERECTOMY AND SALPINGECTOMY, BILATERAL OOPHORECTOMY;  Surgeon: Homero Fellers, MD;  Location: [facility];  Service: Gynecology;  Laterality: Bilateral;   Social History   Occupational History   Not on file  Tobacco Use   Smoking status:  Never   Smokeless tobacco: Never  Vaping Use   Vaping Use: Never used  Substance and Sexual Activity   Alcohol use: Yes    Alcohol/week: 0.0 standard drinks of alcohol    Comment: on occasion (4x/year)   Drug use: No   Sexual activity: Not Currently

## 2022-01-17 ENCOUNTER — Ambulatory Visit
Admission: RE | Admit: 2022-01-17 | Discharge: 2022-01-17 | Disposition: A | Discharge status: Home / Self Care | Payer: No Typology Code available for payment source | Source: Ambulatory Visit | Attending: Surgery | Admitting: Surgery

## 2022-01-17 DIAGNOSIS — M7661 Achilles tendinitis, right leg: Secondary | ICD-10-CM

## 2022-01-17 DIAGNOSIS — M25571 Pain in right ankle and joints of right foot: Secondary | ICD-10-CM

## 2022-01-29 ENCOUNTER — Ambulatory Visit (INDEPENDENT_AMBULATORY_CARE_PROVIDER_SITE_OTHER): Payer: No Typology Code available for payment source | Admitting: Orthopedic Surgery

## 2022-01-29 ENCOUNTER — Encounter: Payer: Self-pay | Admitting: Orthopedic Surgery

## 2022-01-29 DIAGNOSIS — M9261 Juvenile osteochondrosis of tarsus, right ankle: Secondary | ICD-10-CM

## 2022-01-29 DIAGNOSIS — M7661 Achilles tendinitis, right leg: Secondary | ICD-10-CM | POA: Diagnosis not present

## 2022-01-29 NOTE — Progress Notes (Signed)
Office Visit Note   Patient: Maureen King           Date of Birth: 06-30-1955           MRN: [id number] Visit Date: 01/29/2022              Requested by: Valerie Roys, DO [place],  [place] [address] PCP: Valerie Roys, DO  Chief Complaint  Patient presents with   Right Ankle - Follow-up    MRI review      HPI: Patient is a 66 year old woman who is seen for initial evaluation referral from [place].  She has been having insertional Achilles tendinitis on the right heel she states she has undergone prolonged conservative therapy including using a fracture boot.  She states she still has pain with activities of daily living difficulty walking difficulty going up and down stairs.  Assessment & Plan: Visit Diagnoses:  1. Achilles tendinitis, right leg   2. Haglund's deformity, right     Plan: Discussed with the patient options including strengthening versus surgical intervention.  Surgical intervention that was discussed would be resection of the Haglund's deformity detachment of the Achilles tendon and reattachment of the Achilles tendon.  Risks and benefits were discussed including infection neurovascular injury persistent pain nonhealing of the wound nonhealing the repair need for additional surgery.  Patient states she understands wishes to proceed with surgery after her trip in October.  Follow-Up Instructions: Return if symptoms worsen or fail to improve.   Ortho Exam  Patient is alert, oriented, no adenopathy, well-dressed, normal affect, normal respiratory effort. Examination patient is a good dorsalis pedis pulse she has dorsiflexion to neutral.  She is tender to palpation the insertion of the Achilles tendon.  Peroneal and posterior tibial tendon is not tender to palpation.  There are no palpable defects along the Achilles no nodules.  She can do a heel raise.  Posterior tibial tendon intact.  Imaging: No results found. No images are attached to the  encounter.  Labs: Lab Results  Component Value Date   HGBA1C 5.0 03/11/2018   LABURIC 4.2 07/10/2021     Lab Results  Component Value Date   ALBUMIN 4.4 07/10/2021   ALBUMIN 4.5 01/07/2021   ALBUMIN 4.2 04/12/2020    No results found for: "MG" Lab Results  Component Value Date   VD25OH 37.5 07/10/2021   VD25OH 40.3 03/08/2017    No results found for: "PREALBUMIN"    Latest Ref Rng & Units 07/10/2021    9:11 AM 01/07/2021    9:39 AM 08/26/2020   11:25 AM  CBC EXTENDED  WBC 3.4 - 10.8 x10E3/uL 5.8  6.5  6.1   RBC 3.77 - 5.28 x10E6/uL 4.73  4.79  4.85   Hemoglobin 11.1 - 15.9 g/dL 14.3  14.3  14.5   HCT 34.0 - 46.6 % 42.7  42.3  42.6   Platelets 150 - 450 x10E3/uL 277  300  298   NEUT# 1.4 - 7.0 x10E3/uL 3.9  4.3    Lymph# 0.7 - 3.1 x10E3/uL 1.1  1.2       There is no height or weight on file to calculate BMI.  Orders:  No orders of the defined types were placed in this encounter.  No orders of the defined types were placed in this encounter.    Procedures: No procedures performed  Clinical Data: No additional findings.  ROS:  All other systems negative, except as noted in the  HPI. Review of Systems  Objective: Vital Signs: LMP  (LMP Unknown)   Specialty Comments:  No specialty comments available.  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   Endometrial hyperplasia without atypia    Endometrial polyp    Bradycardia 03/08/2017   Morbid obesity ([place]) 02/12/2015   Depression 02/12/2015   Insomnia 02/12/2015   GERD (gastroesophageal reflux disease) 02/12/2015   Past Medical History:  Diagnosis Date   Anxiety    Depression    GERD (gastroesophageal reflux disease)    Insomnia    Obesity    Palpitations     Family History  Problem Relation Age of Onset    Hypertension Mother    Osteoporosis Mother    Heart disease Father 9       Stent   Lung disease Father    Idiopathic pulmonary fibrosis Father    Hypertension Brother    Cancer Maternal Grandmother        gallbladder   Heart disease Paternal Grandfather        MI   Breast cancer Neg Hx     Past Surgical History:  Procedure Laterality Date   COLONOSCOPY     COLPOSCOPY N/A 05/30/2020   Procedure: COLPOSCOPY;  Surgeon: Homero Fellers, MD;  Location: [facility];  Service: Gynecology;  Laterality: N/A;   CYSTOSCOPY N/A 08/29/2020   Procedure: CYSTOSCOPY;  Surgeon: Homero Fellers, MD;  Location: [facility];  Service: Gynecology;  Laterality: N/A;   HYSTEROSCOPY WITH D & C N/A 05/30/2020   Procedure: DILATATION AND CURETTAGE /HYSTEROSCOPY;  Surgeon: Homero Fellers, MD;  Location: [facility];  Service: Gynecology;  Laterality: N/A;   NO PAST SURGERIES     ROBOTIC ASSISTED LAPAROSCOPIC HYSTERECTOMY AND SALPINGECTOMY Bilateral 08/29/2020   Procedure: XI ROBOTIC ASSISTED LAPAROSCOPIC HYSTERECTOMY AND SALPINGECTOMY, BILATERAL OOPHORECTOMY;  Surgeon: Homero Fellers, MD;  Location: [facility];  Service: Gynecology;  Laterality: Bilateral;   Social History   Occupational History   Not on file  Tobacco Use   Smoking status: Never   Smokeless tobacco: Never  Vaping Use   Vaping Use: Never used  Substance and Sexual Activity   Alcohol use: Yes    Alcohol/week: 0.0 standard drinks of alcohol    Comment: on occasion (4x/year)   Drug use: No   Sexual activity: Not Currently

## 2022-02-05 ENCOUNTER — Other Ambulatory Visit: Payer: Self-pay | Admitting: Family Medicine

## 2022-02-06 NOTE — Telephone Encounter (Signed)
Requested Prescriptions  Pending Prescriptions Disp Refills  . PARoxetine (PAXIL-CR) 12.5 MG 24 hr tablet [Pharmacy Med Name: PAROXETINE  12.'5MG'$   TAB  EXTENDED RELEASE] 90 tablet 0    Sig: TAKE 1 TABLET BY MOUTH DAILY     Psychiatry:  Antidepressants - SSRI Passed - 02/05/2022  5:15 AM      Passed - Completed PHQ-2 or PHQ-9 in the last 360 days      Passed - Valid encounter within last 6 months    Recent Outpatient Visits          1 month ago Insertional Achilles tendinopathy   [place], Megan P, DO   4 months ago Cutaneous abscess of trunk, unspecified site of trunk   [place], Santiago Glad, NP   6 months ago Recurrent major depressive disorder, in partial remission ([place])   [place] P, DO   7 months ago Recurrent major depressive disorder, in partial remission ([place])   [place], Megan P, DO   1 year ago Routine general medical examination at a health care facility   [place], Barb Merino, DO      Future Appointments            In 3 weeks Wynetta Emery, Barb Merino, DO [facility], [facility]

## 2022-02-12 ENCOUNTER — Ambulatory Visit: Payer: Managed Care, Other (non HMO) | Admitting: Family Medicine

## 2022-02-23 ENCOUNTER — Other Ambulatory Visit: Payer: Self-pay | Admitting: Family Medicine

## 2022-02-24 NOTE — Telephone Encounter (Signed)
Requested Prescriptions  Pending Prescriptions Disp Refills  . meloxicam (MOBIC) 15 MG tablet [Pharmacy Med Name: Meloxicam 15 MG Oral Tablet] 90 tablet 3    Sig: TAKE 1 TABLET BY MOUTH DAILY     Analgesics:  COX2 Inhibitors Failed - 02/23/2022 11:09 PM      Failed - Manual Review: Labs are only required if the patient has taken medication for more than 8 weeks.      Passed - HGB in normal range and within 360 days    Hemoglobin  Date Value Ref Range Status  07/10/2021 14.3 11.1 - 15.9 g/dL Final         Passed - Cr in normal range and within 360 days    Creatinine, Ser  Date Value Ref Range Status  07/10/2021 0.76 0.57 - 1.00 mg/dL Final         Passed - HCT in normal range and within 360 days    Hematocrit  Date Value Ref Range Status  07/10/2021 42.7 34.0 - 46.6 % Final         Passed - AST in normal range and within 360 days    AST  Date Value Ref Range Status  07/10/2021 21 0 - 40 IU/L Final         Passed - ALT in normal range and within 360 days    ALT  Date Value Ref Range Status  07/10/2021 18 0 - 32 IU/L Final         Passed - eGFR is 30 or above and within 360 days    GFR calc Af Amer  Date Value Ref Range Status  04/12/2020 107 >59 mL/min/1.73 Final    Comment:    **In accordance with recommendations from the NKF-ASN Task force,**   [facility] is in the process of updating its eGFR calculation to the   2021 CKD-EPI creatinine equation that estimates kidney function   without a race variable.    GFR, Estimated  Date Value Ref Range Status  08/26/2020 >60 >60 mL/min Final    Comment:    (NOTE) Calculated using the CKD-EPI Creatinine Equation (2021)    eGFR  Date Value Ref Range Status  07/10/2021 87 >59 mL/min/1.73 Final         Passed - Patient is not pregnant      Passed - Valid encounter within last 12 months    Recent Outpatient Visits          1 month ago Insertional Achilles tendinopathy   [place], Megan P, DO    4 months ago Cutaneous abscess of trunk, unspecified site of trunk   [place], Santiago Glad, NP   6 months ago Recurrent major depressive disorder, in partial remission ([place])   [place], Megan P, DO   7 months ago Recurrent major depressive disorder, in partial remission ([place])   [place], Megan P, DO   1 year ago Routine general medical examination at a health care facility   [place], Barb Merino, DO      Future Appointments            In 3 days Wynetta Emery, Barb Merino, [place], [facility]

## 2022-02-25 ENCOUNTER — Encounter: Payer: Self-pay | Admitting: Podiatry

## 2022-02-25 ENCOUNTER — Ambulatory Visit (INDEPENDENT_AMBULATORY_CARE_PROVIDER_SITE_OTHER): Payer: No Typology Code available for payment source

## 2022-02-25 ENCOUNTER — Ambulatory Visit: Payer: No Typology Code available for payment source | Admitting: Podiatry

## 2022-02-25 DIAGNOSIS — M7661 Achilles tendinitis, right leg: Secondary | ICD-10-CM

## 2022-02-25 DIAGNOSIS — M79671 Pain in right foot: Secondary | ICD-10-CM | POA: Diagnosis not present

## 2022-02-25 MED ORDER — TRIAMCINOLONE ACETONIDE 10 MG/ML IJ SUSP
10.0000 mg | Freq: Once | INTRAMUSCULAR | Status: AC
  Administered 2022-02-25: 10 mg

## 2022-02-25 NOTE — Patient Instructions (Signed)

## 2022-02-25 NOTE — Progress Notes (Signed)
Subjective:   Patient ID: Maureen King, female   DOB: 66 y.o.   MRN: [id number]   HPI Patient states she has had long-term history of pain in the back of the right heel but in the last 6 months its become more intense and she has not had anything done by other physicians except that gave her the choice of surgery but she is desperate to avoid if she can.  States its been very sore.  Patient does not smoke likes to be active   Review of Systems  All other systems reviewed and are negative.       Objective:  Physical Exam Vitals and nursing note reviewed.  Constitutional:      Appearance: She is well-developed.  Pulmonary:     Effort: Pulmonary effort is normal.  Musculoskeletal:        General: Normal range of motion.  Skin:    General: Skin is warm.  Neurological:     Mental Status: She is alert.     Neurovascular status found to be intact muscle strength found to be adequate range of motion adequate with quite a bit of discomfort posterior medial aspect of the right Achilles insertion into the calcaneus with milking of the muscle tendon junction.  There is some swelling of the area which is acute and prior to this event over the last few months she was able to tolerate this and live with it with good shoe gear choices.  Patient has mild equinus condition also noted     Assessment:  Due to inflammatory condition of the posterior Achilles tendon with fluid buildup around the medial side and very painful when pressed      Plan:  H&P condition reviewed and discussed at great length.  I like to avoid surgery if we can and patient with really like this and I did explain injection chances for risk with injection she is willing to take this risk.  I also am going to immobilize her and she needs a short air fracture walker that she can tolerate.  Today I went ahead did sterile prep injected the medial side of the Achilles insertion 3 mg Dexasone Kenalog 5 mg Xylocaine applied a  short air fracture walker which was fitted to her lower leg with instructions on usage and advised on ice therapy and in a couple weeks can begin stretch reappoint 4 weeks or earlier if needed  X-rays indicate posterior spurring of the Achilles insertion into the calcaneus right heel moderate in its nature

## 2022-02-27 ENCOUNTER — Ambulatory Visit (INDEPENDENT_AMBULATORY_CARE_PROVIDER_SITE_OTHER): Payer: No Typology Code available for payment source | Admitting: Family Medicine

## 2022-02-27 ENCOUNTER — Encounter: Payer: Self-pay | Admitting: Family Medicine

## 2022-02-27 ENCOUNTER — Other Ambulatory Visit: Payer: Self-pay | Admitting: Podiatry

## 2022-02-27 VITALS — BP 109/64 | HR 73 | Temp 98.0°F | Ht 60.0 in | Wt 225.2 lb

## 2022-02-27 DIAGNOSIS — Z23 Encounter for immunization: Secondary | ICD-10-CM | POA: Diagnosis not present

## 2022-02-27 DIAGNOSIS — Z0001 Encounter for general adult medical examination with abnormal findings: Secondary | ICD-10-CM | POA: Diagnosis not present

## 2022-02-27 DIAGNOSIS — Z136 Encounter for screening for cardiovascular disorders: Secondary | ICD-10-CM | POA: Diagnosis not present

## 2022-02-27 DIAGNOSIS — Z13820 Encounter for screening for osteoporosis: Secondary | ICD-10-CM | POA: Diagnosis not present

## 2022-02-27 DIAGNOSIS — Z114 Encounter for screening for human immunodeficiency virus [HIV]: Secondary | ICD-10-CM

## 2022-02-27 DIAGNOSIS — Z0000 Encounter for general adult medical examination without abnormal findings: Secondary | ICD-10-CM

## 2022-02-27 DIAGNOSIS — Z008 Encounter for other general examination: Secondary | ICD-10-CM | POA: Diagnosis not present

## 2022-02-27 DIAGNOSIS — M7661 Achilles tendinitis, right leg: Secondary | ICD-10-CM

## 2022-02-27 DIAGNOSIS — Z7189 Other specified counseling: Secondary | ICD-10-CM | POA: Diagnosis not present

## 2022-02-27 DIAGNOSIS — Z1231 Encounter for screening mammogram for malignant neoplasm of breast: Secondary | ICD-10-CM

## 2022-02-27 DIAGNOSIS — R2681 Unsteadiness on feet: Secondary | ICD-10-CM | POA: Diagnosis not present

## 2022-02-27 DIAGNOSIS — F419 Anxiety disorder, unspecified: Secondary | ICD-10-CM | POA: Diagnosis not present

## 2022-02-27 DIAGNOSIS — F3341 Major depressive disorder, recurrent, in partial remission: Secondary | ICD-10-CM | POA: Diagnosis not present

## 2022-02-27 DIAGNOSIS — F324 Major depressive disorder, single episode, in partial remission: Secondary | ICD-10-CM | POA: Diagnosis not present

## 2022-02-27 DIAGNOSIS — K219 Gastro-esophageal reflux disease without esophagitis: Secondary | ICD-10-CM

## 2022-02-27 DIAGNOSIS — Z6841 Body Mass Index (BMI) 40.0 and over, adult: Secondary | ICD-10-CM | POA: Diagnosis not present

## 2022-02-27 LAB — URINALYSIS, ROUTINE W REFLEX MICROSCOPIC
Bilirubin, UA: NEGATIVE
Glucose, UA: NEGATIVE
Ketones, UA: NEGATIVE
Leukocytes,UA: NEGATIVE
Nitrite, UA: NEGATIVE
Protein,UA: NEGATIVE
Specific Gravity, UA: 1.025 (ref 1.005–1.030)
Urobilinogen, Ur: 0.2 mg/dL (ref 0.2–1.0)
pH, UA: 5.5 (ref 5.0–7.5)

## 2022-02-27 LAB — MICROSCOPIC EXAMINATION: Bacteria, UA: NONE SEEN

## 2022-02-27 NOTE — Assessment & Plan Note (Signed)
Under good control on current regimen. Continue current regimen. Continue to monitor. Call with any concerns. Refills given.   

## 2022-02-27 NOTE — Assessment & Plan Note (Signed)
A voluntary discussion about advance care planning including the explanation and discussion of advance directives was extensively discussed  with the patient for 5 minutes with patient present.  Explanation about the health care proxy and Living will was reviewed and packet with forms with explanation of how to fill them out was given.  During this discussion, the patient was able to identify a health care proxy as her husband and has filled out the paperwork required.  Patient was offered a separate [place] visit for further assistance with forms.

## 2022-02-27 NOTE — Progress Notes (Signed)
BP 109/64   Pulse 73   Temp 98 F (36.7 C) (Oral)   Ht 5' (1.524 m)   Wt 225 lb 3.2 oz (102.2 kg)   LMP  (LMP Unknown)   SpO2 99%   BMI 43.98 kg/m    Subjective:    Patient ID: Maureen King, female    DOB: 1955/12/14, 66 y.o.   MRN: [id number]  HPI: Maureen King is a 66 y.o. female presenting on 02/27/2022 for comprehensive medical examination. Current medical complaints include:  DEPRESSION Mood status: controlled Satisfied with current treatment?: yes Symptom severity: mild  Duration of current treatment : chronic Side effects: no Medication compliance: excellent compliance Psychotherapy/counseling: no  Previous psychiatric medications: paxil Depressed mood: no Anxious mood: yes Anhedonia: no Significant weight loss or gain: no Insomnia: no  Fatigue: yes Feelings of worthlessness or guilt: no Impaired concentration/indecisiveness: no Suicidal ideations: no Hopelessness: no Crying spells: no    02/27/2022    9:54 AM 10/02/2021    3:05 PM 08/07/2021    9:16 AM 07/10/2021    8:32 AM 01/07/2021    9:05 AM  Depression screen PHQ 2/9  Decreased Interest 1 1 0 3 0  Down, Depressed, Hopeless 0 1 0 3 0  PHQ - 2 Score 1 2 0 6 0  Altered sleeping '1 2 2 1 '$ 0  Tired, decreased energy 0 '1 2 1 '$ 0  Change in appetite '1 1 1 1 1  '$ Feeling bad or failure about yourself  0 0 0 0 0  Trouble concentrating 1 1 0 0 0  Moving slowly or fidgety/restless 0 0 0 0 0  Suicidal thoughts 0 0 0 0 0  PHQ-9 Score '4 7 5 9 1  '$ Difficult doing work/chores Not difficult at all Not difficult at all  Somewhat difficult Not difficult at all   She currently lives with: husband Menopausal Symptoms: no  Functional Status Survey: Is the patient deaf or have difficulty hearing?: No Does the patient have difficulty seeing, even when wearing glasses/contacts?: Yes Does the patient have difficulty concentrating, remembering, or making decisions?: No Does the patient have difficulty walking or  climbing stairs?: No Does the patient have difficulty dressing or bathing?: No Does the patient have difficulty doing errands alone such as visiting a doctor's office or shopping?: No     02/27/2022    9:20 AM 01/05/2022   10:28 AM 10/02/2021    3:05 PM 08/07/2021    9:17 AM 07/10/2021    8:32 AM  Fall Risk   Falls in the past year? 0 1 1 0 0  Number falls in past yr: 0 1 0 0 0  Injury with Fall? 0 0 0 0 0  Risk for fall due to : Orthopedic patient;No Fall Risks History of fall(s) No Fall Risks No Fall Risks No Fall Risks  Follow up Falls evaluation completed Falls evaluation completed Falls evaluation completed Falls evaluation completed Falls evaluation completed    Depression Screen    02/27/2022    9:54 AM 10/02/2021    3:05 PM 08/07/2021    9:16 AM 07/10/2021    8:32 AM 01/07/2021    9:05 AM  Depression screen PHQ 2/9  Decreased Interest 1 1 0 3 0  Down, Depressed, Hopeless 0 1 0 3 0  PHQ - 2 Score 1 2 0 6 0  Altered sleeping '1 2 2 1 '$ 0  Tired, decreased energy 0 '1 2 1 '$ 0  Change in appetite 1  $'1 1 1 1  'l$ Feeling bad or failure about yourself  0 0 0 0 0  Trouble concentrating 1 1 0 0 0  Moving slowly or fidgety/restless 0 0 0 0 0  Suicidal thoughts 0 0 0 0 0  PHQ-9 Score '4 7 5 9 1  '$ Difficult doing work/chores Not difficult at all Not difficult at all  Somewhat difficult Not difficult at all   Advanced Directives Does patient have a HCPOA?    yes If yes, name and contact information: her husband Does patient have a living will or MOST form?  no  Past Medical History:  Past Medical History:  Diagnosis Date   Anxiety    Depression    GERD (gastroesophageal reflux disease)    Insomnia    Obesity    Palpitations     Surgical History:  Past Surgical History:  Procedure Laterality Date   COLONOSCOPY     COLPOSCOPY N/A 05/30/2020   Procedure: COLPOSCOPY;  Surgeon: Homero Fellers, MD;  Location: [facility];  Service: Gynecology;  Laterality: N/A;   CYSTOSCOPY N/A 08/29/2020    Procedure: CYSTOSCOPY;  Surgeon: Homero Fellers, MD;  Location: [facility];  Service: Gynecology;  Laterality: N/A;   HYSTEROSCOPY WITH D & C N/A 05/30/2020   Procedure: DILATATION AND CURETTAGE /HYSTEROSCOPY;  Surgeon: Homero Fellers, MD;  Location: [facility];  Service: Gynecology;  Laterality: N/A;   NO PAST SURGERIES     ROBOTIC ASSISTED LAPAROSCOPIC HYSTERECTOMY AND SALPINGECTOMY Bilateral 08/29/2020   Procedure: XI ROBOTIC ASSISTED LAPAROSCOPIC HYSTERECTOMY AND SALPINGECTOMY, BILATERAL OOPHORECTOMY;  Surgeon: Homero Fellers, MD;  Location: [facility];  Service: Gynecology;  Laterality: Bilateral;    Medications:  Current Outpatient Medications on File Prior to Visit  Medication Sig   aspirin EC 81 MG tablet Take 81 mg by mouth daily. Swallow whole.   B Complex-C (B-COMPLEX WITH VITAMIN C) tablet Take 1 tablet by mouth daily.   Calcium Carb-Cholecalciferol (CALTRATE 600+D3 PO) Take 1 tablet by mouth daily.   Cholecalciferol (VITAMIN D3) 50 MCG (2000 UT) TABS Take 2,000 Units by mouth daily.   HOMEOPATHIC PRODUCTS PO Take 1 capsule by mouth daily. doTerra On Guard+ Protective Essential Oil Blend   Magnesium 250 MG TABS Take 250 mg by mouth daily.   Misc Natural Products (ELDERBERRY ZINC/VIT C/IMMUNE MT) Take 1 capsule by mouth daily.   PARoxetine (PAXIL-CR) 12.5 MG 24 hr tablet TAKE 1 TABLET BY MOUTH DAILY   triamcinolone ointment (KENALOG) 0.5 % Apply 1 Application topically 2 (two) times daily.   No current facility-administered medications on file prior to visit.    Allergies:  Allergies  Allergen Reactions   Wound Dressing Adhesive Rash   Penicillins Rash   Neosporin [Neomycin-Polymyxin-Gramicidin] Rash    Social History:  Social History   Socioeconomic History   Marital status: Married    Spouse name: Not on file   Number of children: Not on file   Years of education: Not on file   Highest education level: Not on file  Occupational History   Not on  file  Tobacco Use   Smoking status: Never   Smokeless tobacco: Never  Vaping Use   Vaping Use: Never used  Substance and Sexual Activity   Alcohol use: Yes    Alcohol/week: 0.0 standard drinks of alcohol    Comment: on occasion (4x/year)   Drug use: No   Sexual activity: Not Currently  Other Topics Concern   Not on file  Social History  Narrative   Not on file   Social Determinants of Health   Financial Resource Strain: Not on file  Food Insecurity: Not on file  Transportation Needs: Not on file  Physical Activity: Not on file  Stress: Not on file  Social Connections: Not on file  Intimate Partner Violence: Not on file   Social History   Tobacco Use  Smoking Status Never  Smokeless Tobacco Never   Social History   Substance and Sexual Activity  Alcohol Use Yes   Alcohol/week: 0.0 standard drinks of alcohol   Comment: on occasion (4x/year)    Family History:  Family History  Problem Relation Age of Onset   Hypertension Mother    Osteoporosis Mother    Heart disease Father 34       Stent   Lung disease Father    Idiopathic pulmonary fibrosis Father    Hypertension Brother    Cancer Maternal Grandmother        gallbladder   Heart disease Paternal Grandfather        MI   Breast cancer Neg Hx     Past medical history, surgical history, medications, allergies, family history and social history reviewed with patient today and changes made to appropriate areas of the chart.   Review of Systems  Constitutional: Negative.   HENT: Negative.    Eyes: Negative.   Respiratory: Negative.    Cardiovascular: Negative.   Gastrointestinal: Negative.   Genitourinary: Negative.   Musculoskeletal: Negative.   Skin: Negative.   Neurological: Negative.   Endo/Heme/Allergies: Negative.   Psychiatric/Behavioral:  Negative for depression, hallucinations, memory loss, substance abuse and suicidal ideas. The patient is nervous/anxious. The patient does not have insomnia.      All other ROS negative except what is listed above and in the HPI.      Objective:    BP 109/64   Pulse 73   Temp 98 F (36.7 C) (Oral)   Ht 5' (1.524 m)   Wt 225 lb 3.2 oz (102.2 kg)   LMP  (LMP Unknown)   SpO2 99%   BMI 43.98 kg/m   Wt Readings from Last 3 Encounters:  02/27/22 225 lb 3.2 oz (102.2 kg)  01/05/22 230 lb 6.4 oz (104.5 kg)  10/02/21 232 lb 9.6 oz (105.5 kg)    Hearing Screening   '500Hz'$  '1000Hz'$  '2000Hz'$  '4000Hz'$   Right ear Pass Pass Pass Pass  Left ear Pass Pass Pass Pass   Vision Screening   Right eye Left eye Both eyes  Without correction     With correction '20/25 20/20 20/20 '$    Physical Exam Vitals and nursing note reviewed.  Constitutional:      General: She is not in acute distress.    Appearance: Normal appearance. She is obese. She is not ill-appearing, toxic-appearing or diaphoretic.  HENT:     Head: Normocephalic and atraumatic.     Right Ear: Tympanic membrane, ear canal and external ear normal. There is no impacted cerumen.     Left Ear: Tympanic membrane, ear canal and external ear normal. There is no impacted cerumen.     Nose: Nose normal. No congestion or rhinorrhea.     Mouth/Throat:     Mouth: Mucous membranes are moist.     Pharynx: Oropharynx is clear. No oropharyngeal exudate or posterior oropharyngeal erythema.  Eyes:     General: No scleral icterus.       Right eye: No discharge.        Left  eye: No discharge.     Extraocular Movements: Extraocular movements intact.     Conjunctiva/sclera: Conjunctivae normal.     Pupils: Pupils are equal, round, and reactive to light.  Neck:     Vascular: No carotid bruit.  Cardiovascular:     Rate and Rhythm: Normal rate and regular rhythm.     Pulses: Normal pulses.     Heart sounds: No murmur heard.    No friction rub. No gallop.  Pulmonary:     Effort: Pulmonary effort is normal. No respiratory distress.     Breath sounds: Normal breath sounds. No stridor. No wheezing, rhonchi or  rales.  Chest:     Chest wall: No tenderness.  Abdominal:     General: Abdomen is flat. Bowel sounds are normal. There is no distension.     Palpations: Abdomen is soft. There is no mass.     Tenderness: There is no abdominal tenderness. There is no right CVA tenderness, left CVA tenderness, guarding or rebound.     Hernia: No hernia is present.  Genitourinary:    Comments: Breast and pelvic exams deferred with shared decision making Musculoskeletal:        General: No swelling, tenderness, deformity or signs of injury. Normal range of motion.     Cervical back: Normal range of motion and neck supple. No rigidity. No muscular tenderness.     Right lower leg: No edema.     Left lower leg: No edema.  Lymphadenopathy:     Cervical: No cervical adenopathy.  Skin:    General: Skin is warm and dry.     Capillary Refill: Capillary refill takes less than 2 seconds.     Coloration: Skin is not jaundiced or pale.     Findings: No bruising, erythema, lesion or rash.  Neurological:     General: No focal deficit present.     Mental Status: She is alert and oriented to person, place, and time. Mental status is at baseline.     Cranial Nerves: No cranial nerve deficit.     Sensory: No sensory deficit.     Motor: No weakness.     Coordination: Coordination normal.     Gait: Gait normal.     Deep Tendon Reflexes: Reflexes normal.  Psychiatric:        Mood and Affect: Mood normal.        Behavior: Behavior normal.        Thought Content: Thought content normal.        Judgment: Judgment normal.        02/27/2022   10:04 AM  6CIT Screen  What Year? 0 points  What month? 0 points  What time? 0 points  Count back from 20 0 points  Months in reverse 0 points  Repeat phrase 0 points  Total Score 0 points    Results for orders placed or performed in visit on 02/27/22  Microscopic Examination   Urine  Result Value Ref Range   WBC, UA 0-5 0 - 5 /hpf   RBC, Urine 0-2 0 - 2 /hpf    Epithelial Cells (non renal) 0-10 0 - 10 /hpf   Bacteria, UA None seen None seen/Few  Urinalysis, Routine w reflex microscopic  Result Value Ref Range   Specific Gravity, UA 1.025 1.005 - 1.030   pH, UA 5.5 5.0 - 7.5   Color, UA Yellow Yellow   Appearance Ur Clear Clear   Leukocytes,UA Negative Negative   Protein,UA Negative  Negative/Trace   Glucose, UA Negative Negative   Ketones, UA Negative Negative   RBC, UA Trace (A) Negative   Bilirubin, UA Negative Negative   Urobilinogen, Ur 0.2 0.2 - 1.0 mg/dL   Nitrite, UA Negative Negative   Microscopic Examination See below:       Assessment & Plan:   Problem List Items Addressed This Visit       Digestive   GERD (gastroesophageal reflux disease)    Under good control on current regimen. Continue current regimen. Continue to monitor. Call with any concerns. Refills given.        Relevant Orders   CBC with Differential/Platelet     Other   Depression    Under good control on current regimen. Continue current regimen. Continue to monitor. Call with any concerns. Refills given.        Relevant Orders   Urinalysis, Routine w reflex microscopic (Completed)   TSH   Advance directive discussed with patient    A voluntary discussion about advance care planning including the explanation and discussion of advance directives was extensively discussed  with the patient for 5 minutes with patient present.  Explanation about the health care proxy and Living will was reviewed and packet with forms with explanation of how to fill them out was given.  During this discussion, the patient was able to identify a health care proxy as her husband and has filled out the paperwork required.  Patient was offered a separate [place] visit for further assistance with forms.         Other Visit Diagnoses     Welcome to Medicare preventive visit    -  Primary   Preventative care discussed today as below.    Screening for cardiovascular  condition       EKG normal. Labs drawn today. Await results.    Relevant Orders   EKG 12-Lead (Completed)   Comprehensive metabolic panel   Lipid Panel w/o Chol/HDL Ratio   Screening for HIV (human immunodeficiency virus)       Labs drawn today. Await results.    Relevant Orders   HIV Antibody (routine testing w rflx)   Need for influenza vaccination       Flu shot given today.   Relevant Orders   Flu Vaccine QUAD High Dose(Fluad) (Completed)   Need for pneumococcal 20-valent conjugate vaccination       Relevant Orders   Pneumococcal conjugate vaccine 20-valent (Prevnar 20) (Completed)   Screening for osteoporosis       DEXA ordered today.   Relevant Orders   DG Bone Density   Encounter for screening mammogram for malignant neoplasm of breast       Mammo ordered today.   Relevant Orders   MM 3D SCREEN BREAST BILATERAL        Preventative Services:  AAA screening: N/A Health Risk Assessment and Personalized Prevention Plan: Done today Bone Mass Measurements: Ordered today Breast Cancer Screening: Ordered today CVD Screening: Done today Cervical Cancer Screening: N/A Colon Cancer Screening: up to date Depression Screening: Done today Diabetes Screening: Done today Glaucoma Screening: See your eye doctor Hepatitis B vaccine: N/A Hepatitis C screening: up to date HIV Screening: up to date Flu Vaccine: Done today Lung cancer Screening: N/A Obesity Screening: Done today Pneumonia Vaccines: Given today STI Screening: N/A  Follow up plan: Return in about 6 months (around 08/28/2022).   LABORATORY TESTING:  - Pap smear: not applicable  IMMUNIZATIONS:   -  Tdap: Tetanus vaccination status reviewed: last tetanus booster within 10 years. - Influenza: Administered today - Prevnar: Administered today - Zostavax vaccine: Up to date  SCREENING: -Mammogram: Ordered today  - Colonoscopy: Up to date  - Bone Density: Ordered today  -Hearing Test: Ordered today   PATIENT  COUNSELING:   Advised to take 1 mg of folate supplement per day if capable of pregnancy.   Sexuality: Discussed sexually transmitted diseases, partner selection, use of condoms, avoidance of unintended pregnancy  and contraceptive alternatives.   Advised to avoid cigarette smoking.  I discussed with the patient that most people either abstain from alcohol or drink within safe limits (<=14/week and <=4 drinks/occasion for males, <=7/weeks and <= 3 drinks/occasion for females) and that the risk for alcohol disorders and other health effects rises proportionally with the number of drinks per week and how often a drinker exceeds daily limits.  Discussed cessation/primary prevention of drug use and availability of treatment for abuse.   Diet: Encouraged to adjust caloric intake to maintain  or achieve ideal body weight, to reduce intake of dietary saturated fat and total fat, to limit sodium intake by avoiding high sodium foods and not adding table salt, and to maintain adequate dietary potassium and calcium preferably from fresh fruits, vegetables, and low-fat dairy products.    stressed the importance of regular exercise  Injury prevention: Discussed safety belts, safety helmets, smoke detector, smoking near bedding or upholstery.   Dental health: Discussed importance of regular tooth brushing, flossing, and dental visits.    NEXT PREVENTATIVE PHYSICAL DUE IN 1 YEAR. Return in about 6 months (around 08/28/2022).

## 2022-02-27 NOTE — Patient Instructions (Addendum)
Preventative Services:  AAA screening: N/A Health Risk Assessment and Personalized Prevention Plan: Done today Bone Mass Measurements: Ordered today Breast Cancer Screening: Ordered today CVD Screening: Done today Cervical Cancer Screening: N/A Colon Cancer Screening: up to date Depression Screening: Done today Diabetes Screening: Done today Glaucoma Screening: See your eye doctor Hepatitis B vaccine: N/A Hepatitis C screening: up to date HIV Screening: up to date Flu Vaccine: Done today Lung cancer Screening: N/A Obesity Screening: Done today Pneumonia Vaccines: Given today STI Screening: N/A  Please call to schedule your mammogram and/or bone density: [facility]: [address], [place], [place] [address] Phone: [phone]  [place] at [facility] [address] [place],    [address] Phone: [phone]

## 2022-02-27 NOTE — Progress Notes (Signed)
Interpreted by me today. NSR 55bpm. No ST segment changes.

## 2022-02-28 LAB — CBC WITH DIFFERENTIAL/PLATELET
Basophils Absolute: 0.1 10*3/uL (ref 0.0–0.2)
Basos: 1 %
EOS (ABSOLUTE): 0.1 10*3/uL (ref 0.0–0.4)
Eos: 2 %
Hematocrit: 43.5 % (ref 34.0–46.6)
Hemoglobin: 14.5 g/dL (ref 11.1–15.9)
Immature Grans (Abs): 0 10*3/uL (ref 0.0–0.1)
Immature Granulocytes: 0 %
Lymphocytes Absolute: 1.1 10*3/uL (ref 0.7–3.1)
Lymphs: 15 %
MCH: 30 pg (ref 26.6–33.0)
MCHC: 33.3 g/dL (ref 31.5–35.7)
MCV: 90 fL (ref 79–97)
Monocytes Absolute: 0.4 10*3/uL (ref 0.1–0.9)
Monocytes: 5 %
Neutrophils Absolute: 5.6 10*3/uL (ref 1.4–7.0)
Neutrophils: 77 %
Platelets: 251 10*3/uL (ref 150–450)
RBC: 4.84 x10E6/uL (ref 3.77–5.28)
RDW: 12.8 % (ref 11.7–15.4)
WBC: 7.3 10*3/uL (ref 3.4–10.8)

## 2022-02-28 LAB — COMPREHENSIVE METABOLIC PANEL
ALT: 22 IU/L (ref 0–32)
AST: 27 IU/L (ref 0–40)
Albumin/Globulin Ratio: 2.5 — ABNORMAL HIGH (ref 1.2–2.2)
Albumin: 5 g/dL — ABNORMAL HIGH (ref 3.9–4.9)
Alkaline Phosphatase: 115 IU/L (ref 44–121)
BUN/Creatinine Ratio: 24 (ref 12–28)
BUN: 20 mg/dL (ref 8–27)
Bilirubin Total: 0.3 mg/dL (ref 0.0–1.2)
CO2: 23 mmol/L (ref 20–29)
Calcium: 9.8 mg/dL (ref 8.7–10.3)
Chloride: 100 mmol/L (ref 96–106)
Creatinine, Ser: 0.82 mg/dL (ref 0.57–1.00)
Globulin, Total: 2 g/dL (ref 1.5–4.5)
Glucose: 92 mg/dL (ref 70–99)
Potassium: 4.2 mmol/L (ref 3.5–5.2)
Sodium: 139 mmol/L (ref 134–144)
Total Protein: 7 g/dL (ref 6.0–8.5)
eGFR: 79 mL/min/{1.73_m2} (ref >59)

## 2022-02-28 LAB — HIV ANTIBODY (ROUTINE TESTING W REFLEX): HIV Screen 4th Generation wRfx: NONREACTIVE

## 2022-02-28 LAB — LIPID PANEL W/O CHOL/HDL RATIO
Cholesterol, Total: 212 mg/dL — ABNORMAL HIGH (ref 100–199)
HDL: 93 mg/dL (ref >39)
LDL Chol Calc (NIH): 101 mg/dL — ABNORMAL HIGH (ref 0–99)
Triglycerides: 106 mg/dL (ref 0–149)
VLDL Cholesterol Cal: 18 mg/dL (ref 5–40)

## 2022-02-28 LAB — TSH: TSH: 1.53 u[IU]/mL (ref 0.450–4.500)

## 2022-03-25 ENCOUNTER — Encounter: Payer: Self-pay | Admitting: Podiatry

## 2022-03-25 ENCOUNTER — Ambulatory Visit (INDEPENDENT_AMBULATORY_CARE_PROVIDER_SITE_OTHER): Payer: No Typology Code available for payment source | Admitting: Podiatry

## 2022-03-25 DIAGNOSIS — M722 Plantar fascial fibromatosis: Secondary | ICD-10-CM

## 2022-03-25 MED ORDER — TRIAMCINOLONE ACETONIDE 10 MG/ML IJ SUSP
10.0000 mg | Freq: Once | INTRAMUSCULAR | Status: AC
  Administered 2022-03-25: 10 mg

## 2022-03-25 NOTE — Progress Notes (Signed)
Subjective:   Patient ID: Maureen King, female   DOB: 66 y.o.   MRN: [id number]   HPI States she is improving but is having 1 area of continued pain in the plantar aspect of the right heel   ROS      Objective:  Physical Exam  Neurovascular status intact with patient's right plantar heel showing an area of discomfort with pressure at the insertional point of the tendon calcaneus     Assessment:  Acute plantar fasciitis right with inflammation pain     Plan:  Sterile prep injected the fascia 3 mg Kenalog 5 mg Xylocaine advised on shoe gear modification stretching exercises discharge unless symptoms were to recur.  Hopefully this will and the problem

## 2022-04-17 ENCOUNTER — Telehealth: Payer: No Typology Code available for payment source | Admitting: Physician Assistant

## 2022-04-17 ENCOUNTER — Ambulatory Visit: Payer: Self-pay | Admitting: *Deleted

## 2022-04-17 DIAGNOSIS — U071 COVID-19: Secondary | ICD-10-CM | POA: Diagnosis not present

## 2022-04-17 MED ORDER — BENZONATATE 100 MG PO CAPS: 100.0000 mg | ORAL_CAPSULE | Freq: Three times a day (TID) | ORAL | 0 refills | Status: DC | PRN

## 2022-04-17 MED ORDER — NIRMATRELVIR/RITONAVIR (PAXLOVID)TABLET: 3.0000 | ORAL_TABLET | Freq: Two times a day (BID) | ORAL | 0 refills | Status: AC

## 2022-04-17 NOTE — Telephone Encounter (Signed)
Summary: positive for COVID today.   Pt stated he tested positive for COVID today stated her current symptoms are body aches, cough, and a slight fever. Denied SOB.  Pt is asking if something could be called in . Pt seeking clinical advice.     Reason for Disposition  [1] HIGH RISK patient (e.g., weak immune system, age > 55 years, obesity with BMI 30 or higher, pregnant, chronic lung disease or other chronic medical condition) AND [2] COVID symptoms (e.g., cough, fever)  (Exceptions: Already seen by PCP and no new or worsening symptoms.)  Answer Assessment - Initial Assessment Questions 1. COVID-19 DIAGNOSIS: "How do you know that you have COVID?" (e.g., positive lab test or self-test, diagnosed by doctor or NP/PA, symptoms after exposure).     + home test 2. COVID-19 EXPOSURE: "Was there any known exposure to COVID before the symptoms began?" CDC Definition of close contact: within 6 feet (2 meters) for a total of 15 minutes or more over a 24-hour period.      husband 3. ONSET: "When did the COVID-19 symptoms start?"      2 days ago 4. WORST SYMPTOM: "What is your worst symptom?" (e.g., cough, fever, shortness of breath, muscle aches)     Headache- slight, achy, chills 5. COUGH: "Do you have a cough?" If Yes, ask: "How bad is the cough?"       Not a lot- slight 6. FEVER: "Do you have a fever?" If Yes, ask: "What is your temperature, how was it measured, and when did it start?"     100.4 7. RESPIRATORY STATUS: "Describe your breathing?" (e.g., normal; shortness of breath, wheezing, unable to speak)      normal 8. BETTER-SAME-WORSE: "Are you getting better, staying the same or getting worse compared to yesterday?"  If getting worse, ask, "In what way?"     same 9. OTHER SYMPTOMS: "Do you have any other symptoms?"  (e.g., chills, fatigue, headache, loss of smell or taste, muscle pain, sore throat)     Headache, muscle pain, sore throat, chills 10. HIGH RISK DISEASE: "Do you have any  chronic medical problems?" (e.g., asthma, heart or lung disease, weak immune system, obesity, etc.)       Overweight  11. VACCINE: "Have you had the COVID-19 vaccine?" If Yes, ask: "Which one, how many shots, when did you get it?"       2 vaccines, 1 booster  Protocols used: Coronavirus (COVID-19) Diagnosed or Suspected-A-AH

## 2022-04-17 NOTE — Patient Instructions (Signed)
Maureen King, thank you for joining Mar Daring, PA-C for today's virtual visit.  While this provider is not your primary care provider (PCP), if your PCP is located in our provider database this encounter information will be shared with them immediately following your visit.   [place] account gives you access to today's visit and all your visits, tests, and labs performed at [facility] " click here if you don't have a [place] account or go to [email or web address]  Consent: (Patient) Maureen King provided verbal consent for this virtual visit at the beginning of the encounter.  Current Medications:  Current Outpatient Medications:    benzonatate (TESSALON) 100 MG capsule, Take 1 capsule (100 mg total) by mouth 3 (three) times daily as needed., Disp: 30 capsule, Rfl: 0   nirmatrelvir/ritonavir EUA (PAXLOVID) 20 x 150 MG & 10 x '100MG'$  TABS, Take 3 tablets by mouth 2 (two) times daily for 5 days. (Take nirmatrelvir 150 mg two tablets twice daily for 5 days and ritonavir 100 mg one tablet twice daily for 5 days) Patient GFR is 79, Disp: 30 tablet, Rfl: 0   aspirin EC 81 MG tablet, Take 81 mg by mouth daily. Swallow whole., Disp: , Rfl:    B Complex-C (B-COMPLEX WITH VITAMIN C) tablet, Take 1 tablet by mouth daily., Disp: , Rfl:    Calcium Carb-Cholecalciferol (CALTRATE 600+D3 PO), Take 1 tablet by mouth daily., Disp: , Rfl:    Cholecalciferol (VITAMIN D3) 50 MCG (2000 UT) TABS, Take 2,000 Units by mouth daily., Disp: , Rfl:    HOMEOPATHIC PRODUCTS PO, Take 1 capsule by mouth daily. doTerra On Guard+ Protective Essential Oil Blend, Disp: , Rfl:    Magnesium 250 MG TABS, Take 250 mg by mouth daily., Disp: , Rfl:    Misc Natural Products (ELDERBERRY ZINC/VIT C/IMMUNE MT), Take 1 capsule by mouth daily., Disp: , Rfl:    PARoxetine (PAXIL-CR) 12.5 MG 24 hr tablet, TAKE 1 TABLET BY MOUTH DAILY, Disp: 90 tablet, Rfl: 0   triamcinolone  ointment (KENALOG) 0.5 %, Apply 1 Application topically 2 (two) times daily., Disp: 30 g, Rfl: 0   Medications ordered in this encounter:  Meds ordered this encounter  Medications   nirmatrelvir/ritonavir EUA (PAXLOVID) 20 x 150 MG & 10 x '100MG'$  TABS    Sig: Take 3 tablets by mouth 2 (two) times daily for 5 days. (Take nirmatrelvir 150 mg two tablets twice daily for 5 days and ritonavir 100 mg one tablet twice daily for 5 days) Patient GFR is 79    Dispense:  30 tablet    Refill:  0    Order Specific Question:   Supervising Provider    Answer:   Chase Picket [[id number]]   benzonatate (TESSALON) 100 MG capsule    Sig: Take 1 capsule (100 mg total) by mouth 3 (three) times daily as needed.    Dispense:  30 capsule    Refill:  0    Order Specific Question:   Supervising Provider    Answer:   Chase Picket [id number]     *If you need refills on other medications prior to your next appointment, please contact your pharmacy*  Follow-Up: Call back or seek an in-person evaluation if the symptoms worsen or if the condition fails to improve as anticipated.  [place] [phone]  Other Instructions  COVID-19 COVID-19, or coronavirus disease 2019, is an infection that is caused by a new (novel)  coronavirus called SARS-CoV-2. COVID-19 can cause many symptoms. In some people, the virus may not cause any symptoms. In others, it may cause mild or severe symptoms. Some people with severe infection develop severe disease. What are the causes? This illness is caused by a virus. The virus may be in the air as tiny specks of fluid (aerosols) or droplets, or it may be on surfaces. You may catch the virus by: Breathing in droplets from an infected person. Droplets can be spread by a person breathing, speaking, singing, coughing, or sneezing. Touching something, like a table or a doorknob, that has virus on it (is contaminated) and then touching your mouth, nose, or eyes. What  increases the risk? Risk for infection: You are more likely to get infected with the COVID-19 virus if: You are within 6 ft (1.8 m) of a person with COVID-19 for 15 minutes or longer. You are providing care for a person who is infected with COVID-19. You are in close personal contact with other people. Close personal contact includes hugging, kissing, or sharing eating or drinking utensils. Risk for serious illness caused by COVID-19: You are more likely to get seriously ill from the COVID-19 virus if: You have cancer. You have a long-term (chronic) disease, such as: Chronic lung disease. This includes pulmonary embolism, chronic obstructive pulmonary disease, and cystic fibrosis. Long-term disease that lowers your body's ability to fight infection (immunocompromise). Serious cardiac conditions, such as heart failure, coronary artery disease, or cardiomyopathy. Diabetes. Chronic kidney disease. Liver diseases. These include cirrhosis, nonalcoholic fatty liver disease, alcoholic liver disease, or autoimmune hepatitis. You have obesity. You are pregnant or were recently pregnant. You have sickle cell disease. What are the signs or symptoms? Symptoms of this condition can range from mild to severe. Symptoms may appear any time from 2 to 14 days after being exposed to the virus. They include: Fever or chills. Shortness of breath or trouble breathing. Feeling tired or very tired. Headaches, body aches, or muscle aches. Runny or stuffy nose, sneezing, coughing, or sore throat. New loss of taste or smell. This is rare. Some people may also have stomach problems, such as nausea, vomiting, or diarrhea. Other people may not have any symptoms of COVID-19. How is this diagnosed? This condition may be diagnosed by testing samples to check for the COVID-19 virus. The most common tests are the PCR test and the antigen test. Tests may be done in the lab or at home. They include: Using a swab to take a  sample of fluid from the back of your nose and throat (nasopharyngeal fluid), from your nose, or from your throat. Testing a sample of saliva from your mouth. Testing a sample of coughed-up mucus from your lungs (sputum). How is this treated? Treatment for COVID-19 infection depends on the severity of the condition. Mild symptoms can be managed at home with rest, fluids, and over-the-counter medicines. Serious symptoms may be treated in a hospital intensive care unit (ICU). Treatment in the ICU may include: Supplemental oxygen. Extra oxygen is given through a tube in the nose, a face mask, or a hood. Medicines. These may include: Antivirals, such as monoclonal antibodies. These help your body fight off certain viruses that can cause disease. Anti-inflammatories, such as corticosteroids. These reduce inflammation and suppress the immune system. Antithrombotics. These prevent or treat blood clots, if they develop. Convalescent plasma. This helps boost your immune system, if you have an underlying immunosuppressive condition or are getting immunosuppressive treatments. Prone positioning. This means  you will lie on your stomach. This helps oxygen to get into your lungs. Infection control measures. If you are at risk for more serious illness caused by COVID-19, your health care provider may prescribe two long-acting monoclonal antibodies, given together every 6 months. How is this prevented? To protect yourself: Use preventive medicine (pre-exposure prophylaxis). You may get pre-exposure prophylaxis if you have moderate or severe immunocompromise. Get vaccinated. Anyone 2 months old or older who meets guidelines can get a COVID-19 vaccine or vaccine series. This includes people who are pregnant or making breast milk (lactating). Get an added dose of COVID-19 vaccine after your first vaccine or vaccine series if you have moderate to severe immunocompromise. This applies if you have had a solid organ  transplant or have been diagnosed with an immunocompromising condition. You should get the added dose 4 weeks after you got the first COVID-19 vaccine or vaccine series. If you get an mRNA vaccine, you will need a 3-dose primary series. If you get the J&J/Janssen vaccine, you will need a 2-dose primary series, with the second dose being an mRNA vaccine. Talk to your health care provider about getting experimental monoclonal antibodies. This treatment is approved under emergency use authorization to prevent severe illness before or after being exposed to the COVID-19 virus. You may be given monoclonal antibodies if: You have moderate or severe immunocompromise. This includes treatments that lower your immune response. People with immunocompromise may not develop protection against COVID-19 when they are vaccinated. You cannot be vaccinated. You may not get a vaccine if you have a severe allergic reaction to the vaccine or its components. You are not fully vaccinated. You are in a facility where COVID-19 is present and: Are in close contact with a person who is infected with the COVID-19 virus. Are at high risk of being exposed to the COVID-19 virus. You are at risk of illness from new variants of the COVID-19 virus. To protect others: If you have symptoms of COVID-19, take steps to prevent the virus from spreading to others. Stay home. Leave your house only to get medical care. Do not use public transit, if possible. Do not travel while you are sick. Wash your hands often with soap and water for at least 20 seconds. If soap and water are not available, use alcohol-based hand sanitizer. Make sure that all people in your household wash their hands well and often. Cough or sneeze into a tissue or your sleeve or elbow. Do not cough or sneeze into your hand or into the air. Where to find more information Centers for Disease Control and Prevention: CharmCourses.be World Health Organization:  https://www.castaneda.info/ Get help right away if: You have trouble breathing. You have pain or pressure in your chest. You are confused. You have bluish lips and fingernails. You have trouble waking from sleep. You have symptoms that get worse. These symptoms may be an emergency. Get help right away. Call 911. Do not wait to see if the symptoms will go away. Do not drive yourself to the hospital. Summary COVID-19 is an infection that is caused by a new coronavirus. Sometimes, there are no symptoms. Other times, symptoms range from mild to severe. Some people with a severe COVID-19 infection develop severe disease. The virus that causes COVID-19 can spread from person to person through droplets or aerosols from breathing, speaking, singing, coughing, or sneezing. Mild symptoms of COVID-19 can be managed at home with rest, fluids, and over-the-counter medicines. This information is not intended to replace  advice given to you by your health care provider. Make sure you discuss any questions you have with your health care provider. Document Revised: 05/06/2021 Document Reviewed: 05/08/2021 Elsevier Patient Education  [place].    If you have been instructed to have an in-person evaluation today at a local Urgent Care facility, please use the link below. It will take you to a list of all of our available [facility], including address, phone number and hours of operation. Please do not delay care.  [facility]  If you or a family member do not have a primary care provider, use the link below to schedule a visit and establish care. When you choose a [facility] primary care physician or advanced practice provider, you gain a long-term partner in health. Find a Primary Care Provider  Learn more about [facility]'s in-office and virtual care options: [facility]

## 2022-04-17 NOTE — Progress Notes (Signed)
Virtual Visit Consent   Maureen King, you are scheduled for a virtual visit with a [facility] provider today. Just as with appointments in the office, your consent must be obtained to participate. Your consent will be active for this visit and any virtual visit you may have with one of our providers in the next 365 days. If you have a MyChart account, a copy of this consent can be sent to you electronically.  As this is a virtual visit, video technology does not allow for your provider to perform a traditional examination. This may limit your provider's ability to fully assess your condition. If your provider identifies any concerns that need to be evaluated in person or the need to arrange testing (such as labs, EKG, etc.), we will make arrangements to do so. Although advances in technology are sophisticated, we cannot ensure that it will always work on either your end or our end. If the connection with a video visit is poor, the visit may have to be switched to a telephone visit. With either a video or telephone visit, we are not always able to ensure that we have a secure connection.  By engaging in this virtual visit, you consent to the provision of healthcare and authorize for your insurance to be billed (if applicable) for the services provided during this visit. Depending on your insurance coverage, you may receive a charge related to this service.  I need to obtain your verbal consent now. Are you willing to proceed with your visit today? Maureen King has provided verbal consent on 04/17/2022 for a virtual visit (video or telephone). Mar Daring, PA-C  Date: 04/17/2022 1:00 PM  Virtual Visit via Video Note   I, Mar Daring, connected with  Maureen King  ([id number], 1955/10/10) on 04/17/22 at 12:45 PM EST by a video-enabled telemedicine application and verified that I am speaking with the correct person using two identifiers.  Location: Patient:  Virtual Visit Location Patient: Home Provider: Virtual Visit Location Provider: Home Office   I discussed the limitations of evaluation and management by telemedicine and the availability of in person appointments. The patient expressed understanding and agreed to proceed.    History of Present Illness: Maureen King is a 66 y.o. who identifies as a female who was assigned female at birth, and is being seen today for Covid 76.  HPI: URI  This is a new problem. The current episode started in the past 7 days (Symptoms started yesterday; tested positive for covid 19 on at home test today). The problem has been gradually worsening. The maximum temperature recorded prior to her arrival was 100.4 - 100.9 F (100.4). The fever has been present for Less than 1 day. Associated symptoms include congestion, coughing, headaches (mild), joint pain, rhinorrhea (post nasal drainage) and a sore throat (dry, scratchy not sore). Pertinent negatives include no diarrhea, ear pain, joint swelling, nausea, neck pain, plugged ear sensation, sinus pain, vomiting or wheezing. She has tried NSAIDs for the symptoms. The treatment provided no relief.     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   Endometrial hyperplasia without atypia    Endometrial polyp    Bradycardia 03/08/2017   Morbid obesity ([place]) 02/12/2015   Depression 02/12/2015   Insomnia  02/12/2015   GERD (gastroesophageal reflux disease) 02/12/2015    Allergies:  Allergies  Allergen Reactions   Wound Dressing Adhesive Rash   Penicillins Rash   Neosporin [Neomycin-Polymyxin-Gramicidin] Rash   Medications:  Current Outpatient Medications:    benzonatate (TESSALON) 100 MG capsule, Take 1 capsule (100 mg  total) by mouth 3 (three) times daily as needed., Disp: 30 capsule, Rfl: 0   nirmatrelvir/ritonavir EUA (PAXLOVID) 20 x 150 MG & 10 x '100MG'$  TABS, Take 3 tablets by mouth 2 (two) times daily for 5 days. (Take nirmatrelvir 150 mg two tablets twice daily for 5 days and ritonavir 100 mg one tablet twice daily for 5 days) Patient GFR is 79, Disp: 30 tablet, Rfl: 0   aspirin EC 81 MG tablet, Take 81 mg by mouth daily. Swallow whole., Disp: , Rfl:    B Complex-C (B-COMPLEX WITH VITAMIN C) tablet, Take 1 tablet by mouth daily., Disp: , Rfl:    Calcium Carb-Cholecalciferol (CALTRATE 600+D3 PO), Take 1 tablet by mouth daily., Disp: , Rfl:    Cholecalciferol (VITAMIN D3) 50 MCG (2000 UT) TABS, Take 2,000 Units by mouth daily., Disp: , Rfl:    HOMEOPATHIC PRODUCTS PO, Take 1 capsule by mouth daily. doTerra On Guard+ Protective Essential Oil Blend, Disp: , Rfl:    Magnesium 250 MG TABS, Take 250 mg by mouth daily., Disp: , Rfl:    Misc Natural Products (ELDERBERRY ZINC/VIT C/IMMUNE MT), Take 1 capsule by mouth daily., Disp: , Rfl:    PARoxetine (PAXIL-CR) 12.5 MG 24 hr tablet, TAKE 1 TABLET BY MOUTH DAILY, Disp: 90 tablet, Rfl: 0   triamcinolone ointment (KENALOG) 0.5 %, Apply 1 Application topically 2 (two) times daily., Disp: 30 g, Rfl: 0  Observations/Objective: Patient is well-developed, well-nourished in no acute distress.  Resting comfortably at home.  Head is normocephalic, atraumatic.  No labored breathing.  Speech is clear and coherent with logical content.  Patient is alert and oriented at baseline.    Assessment and Plan: 1. COVID-19 - nirmatrelvir/ritonavir EUA (PAXLOVID) 20 x 150 MG & 10 x '100MG'$  TABS; Take 3 tablets by mouth 2 (two) times daily for 5 days. (Take nirmatrelvir 150 mg two tablets twice daily for 5 days and ritonavir 100 mg one tablet twice daily for 5 days) Patient GFR is 79  Dispense: 30 tablet; Refill: 0 - MyChart COVID-19 home monitoring program; Future - benzonatate  (TESSALON) 100 MG capsule; Take 1 capsule (100 mg total) by mouth 3 (three) times daily as needed.  Dispense: 30 capsule; Refill: 0  - Continue OTC symptomatic management of choice - Will send OTC vitamins and supplement information through AVS - Paxlovid and tessalon perles prescribed - Patient enrolled in MyChart symptom monitoring - Push fluids - Rest as needed - Discussed return precautions and when to seek in-person evaluation, sent via AVS as well   Follow Up Instructions: I discussed the assessment and treatment plan with the patient. The patient was provided an opportunity to ask questions and all were answered. The patient agreed with the plan and demonstrated an understanding of the instructions.  A copy of instructions were sent to the patient via MyChart unless otherwise noted below.    The patient was advised to call back or seek an in-person evaluation if the symptoms worsen or if the condition fails to improve as anticipated.  Time:  I spent 12 minutes with the patient via telehealth technology discussing the above problems/concerns.    Mar Daring, PA-C

## 2022-04-17 NOTE — Telephone Encounter (Signed)
  Chief Complaint: + COVID Symptoms: slight cough, fatigue, fever Frequency: started 2 days ago Pertinent Negatives: Patient denies SOB Disposition: '[]'$ ED /'[]'$ Urgent Care (no appt availability in office) / '[]'$ Appointment(In office/virtual)/ '[x]'$  [facility]/ '[]'$ Home Care/ '[]'$ Refused Recommended Disposition /'[]'$ [facility]/ '[]'$  Follow-up with PCP Additional Notes: No open appointment in office- scheduled UC visit- COVID protocol-treatment/isolation reviewed

## 2022-04-27 ENCOUNTER — Ambulatory Visit: Payer: Self-pay | Admitting: *Deleted

## 2022-04-27 NOTE — Telephone Encounter (Signed)
Message from Rayann Heman sent at 04/27/2022  9:21 AM EST  Summary: positive covid   Pt called and stated that she is having other covid symptoms. Sinus pressure/headache/Itchy watery eyes. No fever.          Call History   Type Contact Phone/Fax User  04/27/2022 09:20 AM EST Phone (Incoming) Maureen King, Maureen L "Kennyth Lose" (Self) [phone] Lemmie Evens) [name], [name]   Reason for Disposition  [1] Sinus congestion (pressure, fullness) AND [2] present > 10 days  Answer Assessment - Initial Assessment Questions 1. LOCATION: "Where does it hurt?"      Had Covid week before last.  Took Paxlovid.    I was better last week.   Sat. I started feeling bad again.   I have a virtual visit set up for in the morning at 9:00 but the nurse said it might be a good idea to talk with you first.   I went over the home care advice with her and let her know to keep the appt. In the morning.    2. ONSET: "When did the sinus pain start?"  (e.g., hours, days)      Yesterday headache, sneezing and itchy watery eyes.   Mostly sneezing.    No fever. Am I getting Covid again?     Before taking OTC medication I wanted to check with Dr. Wynetta Emery. 3. SEVERITY: "How bad is the pain?"   (Scale 1-10; mild, moderate or severe)   - MILD (1-3): doesn't interfere with normal activities    - MODERATE (4-7): interferes with normal activities (e.g., work or school) or awakens from sleep   - SEVERE (8-10): excruciating pain and patient unable to do any normal activities        Moderate 4. RECURRENT SYMPTOM: "Have you ever had sinus problems before?" If Yes, ask: "When was the last time?" and "What happened that time?"      No 5. NASAL CONGESTION: "Is the nose blocked?" If Yes, ask: "Can you open it or must you breathe through your mouth?"     Sinus congestion   6. NASAL DISCHARGE: "Do you have discharge from your nose?" If so ask, "What color?"     At times I blow stuff out.   It's not green. 7. FEVER: "Do you have a  fever?" If Yes, ask: "What is it, how was it measured, and when did it start?"      No 8. OTHER SYMPTOMS: "Do you have any other symptoms?" (e.g., sore throat, cough, earache, difficulty breathing)     I don't want it to go into my chest.    I have a virtual visit in the morning at 9:00.   9. PREGNANCY: "Is there any chance you are pregnant?" "When was your last menstrual period?"     N/A  Protocols used: Sinus Pain or Congestion-A-AH

## 2022-04-27 NOTE — Telephone Encounter (Signed)
  Chief Complaint: Sinus congestion with headaches and itchy watery eyes.   Had Covid and was treated for it with Paxlovid but now sinuses are congested. Symptoms: above Frequency: Saturday sinus symptoms started Pertinent Negatives: Patient denies fever Disposition: '[]'$ ED /'[]'$ Urgent Care (no appt availability in office) / '[x]'$ Appointment(In office/virtual)/ '[]'$  [facility]/ '[]'$ Home Care/ '[]'$ Refused Recommended Disposition /'[]'$ [facility]/ '[]'$  Follow-up with PCP Additional Notes: Already had an appt set up for in the morning at 9:00 a virtual visit.

## 2022-04-28 ENCOUNTER — Telehealth (INDEPENDENT_AMBULATORY_CARE_PROVIDER_SITE_OTHER): Payer: No Typology Code available for payment source | Admitting: Physician Assistant

## 2022-04-28 ENCOUNTER — Encounter: Payer: Self-pay | Admitting: Physician Assistant

## 2022-04-28 DIAGNOSIS — U071 COVID-19: Secondary | ICD-10-CM | POA: Diagnosis not present

## 2022-04-28 NOTE — Patient Instructions (Addendum)
I recommend continuing with the Alkaseltzer medication and adding an antihistamine such as Claritin or Zyrtec  You can also add Flonase to help open up your sinuses and assist with congestion   Let us know if you start feeling worse or have trouble breathing, wheezing or fever.

## 2022-04-28 NOTE — Progress Notes (Signed)
Virtual Visit via Video Note  I connected with Maureen King on 04/28/22 at  9:00 AM EST by a video enabled telemedicine application and verified that I am speaking with the correct person using two identifiers.  Today's Provider: Talitha Givens, MHS, PA-C Introduced myself to the patient as a PA-C and provided education on APPs in clinical practice.   Location: Patient: at home, [place], [place]  Provider: [place], [place]    I discussed the limitations of evaluation and management by telemedicine and the availability of in person appointments. The patient expressed understanding and agreed to proceed.   Chief Complaint  Patient presents with   Covid Positive    Patient states she tested positive for COVID on 04/17/22. Took antiviral and was feeling better. Patient started to feel sick again 2 days ago, has stuffy head and nose, congestion, and itchy watery eyes. Patient states she has tried OTC alka-seltzer cold medicine.      History of Present Illness:  She states she tested positive for COVID on 04/17/22 - was prescribed Paxlovid and started to feel better  States Sat she was working around her home and started to break out in a sweat then developed runny nose and headaches on Sun States she started taking Alkaseltzer extra strength cold and flu yesterday which provided relief and she was able to sleep better  She denies previous hx of asthma, reactive airway or allergies     Review of Systems  Constitutional:  Negative for chills and fever.  HENT:  Positive for congestion and sinus pain. Negative for ear pain and sore throat.        Sneezing    Respiratory:  Negative for cough, shortness of breath and wheezing.   Gastrointestinal:  Negative for diarrhea, nausea and vomiting.  Musculoskeletal:  Positive for myalgias.  Neurological:  Positive for headaches (mild, feels like it is related to sinuses).     Observations/Objective:  Due to the nature  of the virtual visit, physical exam and observations are limited. Able to obtain the following observations:   Alert, oriented x 3  Appears comfortable, in no acute distress.  No scleral injection, no appreciated hoarseness, tachypnea, wheeze or strider. Able to maintain conversation without visible strain.  No cough appreciated during visit.   Assessment and Plan:  Problem List Items Addressed This Visit   None Visit Diagnoses     COVID-19    -  Primary Acute, resolving  Patient was diagnosed with COVID approx 10 days ago and completed course of Paxlovid States she was feeling much better but over the weekend started to have nasal congestion and runny nose again She is taking Alkaseltzer cold and flu which is helping somewhat with symptoms Recommend she continue to take OTC Alkaseltzer and can add Flonase and preferred antihistamine to further assist with nasal symptoms  Recommend follow up in office if symptoms persist or progress  Follow up as needed for ongoing concerns       Follow Up Instructions:    I discussed the assessment and treatment plan with the patient. The patient was provided an opportunity to ask questions and all were answered. The patient agreed with the plan and demonstrated an understanding of the instructions.   The patient was advised to call back or seek an in-person evaluation if the symptoms worsen or if the condition fails to improve as anticipated.  I provided 12 minutes of non-face-to-face time during this encounter.  No  follow-ups on file.   I, Ollis Daudelin E Caren Garske, PA-C, have reviewed all documentation for this visit. The documentation on 04/28/22 for the exam, diagnosis, procedures, and orders are all accurate and complete.   Talitha Givens, MHS, PA-C [facility]

## 2022-05-04 ENCOUNTER — Other Ambulatory Visit: Payer: Self-pay | Admitting: Family Medicine

## 2022-05-04 NOTE — Telephone Encounter (Signed)
Requested Prescriptions  Pending Prescriptions Disp Refills   PARoxetine (PAXIL-CR) 12.5 MG 24 hr tablet [Pharmacy Med Name: PAROXETINE  12.'5MG'$   TAB  EXTENDED RELEASE] 90 tablet 0    Sig: TAKE 1 TABLET BY MOUTH DAILY     Psychiatry:  Antidepressants - SSRI Passed - 05/04/2022  4:58 AM      Passed - Completed PHQ-2 or PHQ-9 in the last 360 days      Passed - Valid encounter within last 6 months    Recent Outpatient Visits           6 days ago [place], PA-C   2 months ago [facility] [employer] preventive visit   [employer], [place], DO   3 months ago Barrister's clerk Achilles tendinopathy   [place], Megan P, DO   7 months ago Cutaneous abscess of trunk, unspecified site of trunk   [place], Santiago Glad, NP   9 months ago Recurrent major depressive disorder, in partial remission [facility])   [place], [place], DO       Future Appointments             In 3 months [place], Barb Merino, DO [employer], [facility]

## 2022-05-08 ENCOUNTER — Ambulatory Visit (INDEPENDENT_AMBULATORY_CARE_PROVIDER_SITE_OTHER): Payer: No Typology Code available for payment source | Admitting: Podiatry

## 2022-05-08 DIAGNOSIS — M7661 Achilles tendinitis, right leg: Secondary | ICD-10-CM | POA: Diagnosis not present

## 2022-05-08 DIAGNOSIS — M722 Plantar fascial fibromatosis: Secondary | ICD-10-CM | POA: Diagnosis not present

## 2022-05-08 MED ORDER — NITROGLYCERIN 0.2 MG/HR TD PT24: 0.2000 mg | MEDICATED_PATCH | Freq: Every day | TRANSDERMAL | 12 refills | Status: DC

## 2022-05-10 NOTE — Progress Notes (Signed)
Subjective:   Patient ID: Maureen King, female   DOB: 66 y.o.   MRN: [id number]   HPI Patient presents stating that the right Achilles has been bothering her again and it seems to be sore within the bulk of the heel itself versus where it inserts   ROS      Objective:  Physical Exam  Neurovascular status intact with inflammation pain of the Achilles tendon right not at the insertion but more at the muscle tendon junction with no indication of weakness no nodular formation     Assessment:  Probability for muscle tendon junction Achilles tendinitis right     Plan:  H&P reviewed condition and I prescribed nitro patches stretching exercises immobilization and heel lift therapy.  If symptoms persist may require MRI and more aggressive treatment pattern and I did spend a great deal of time educating her on the difference between this and insertional and the chances of rupture associated with where her pain is and that she needs to make sure to stretch properly and be careful

## 2022-06-18 ENCOUNTER — Ambulatory Visit
Admission: RE | Admit: 2022-06-18 | Discharge: 2022-06-18 | Disposition: A | Discharge status: Home / Self Care | Payer: No Typology Code available for payment source | Source: Ambulatory Visit | Attending: Family Medicine | Admitting: Family Medicine

## 2022-06-18 DIAGNOSIS — Z1231 Encounter for screening mammogram for malignant neoplasm of breast: Secondary | ICD-10-CM

## 2022-06-18 DIAGNOSIS — Z780 Asymptomatic menopausal state: Secondary | ICD-10-CM | POA: Insufficient documentation

## 2022-06-18 DIAGNOSIS — Z13820 Encounter for screening for osteoporosis: Secondary | ICD-10-CM

## 2022-06-18 DIAGNOSIS — M85852 Other specified disorders of bone density and structure, left thigh: Secondary | ICD-10-CM | POA: Diagnosis not present

## 2022-06-22 ENCOUNTER — Encounter: Payer: Self-pay | Admitting: Family Medicine

## 2022-06-22 DIAGNOSIS — M85859 Other specified disorders of bone density and structure, unspecified thigh: Secondary | ICD-10-CM | POA: Insufficient documentation

## 2022-08-03 ENCOUNTER — Encounter: Payer: Self-pay | Admitting: Podiatry

## 2022-08-03 ENCOUNTER — Ambulatory Visit: Payer: No Typology Code available for payment source | Admitting: Podiatry

## 2022-08-03 DIAGNOSIS — M7661 Achilles tendinitis, right leg: Secondary | ICD-10-CM

## 2022-08-03 MED ORDER — TRIAMCINOLONE ACETONIDE 10 MG/ML IJ SUSP
10.0000 mg | Freq: Once | INTRAMUSCULAR | Status: AC
  Administered 2022-08-03: 10 mg

## 2022-08-03 NOTE — Progress Notes (Signed)
Subjective:   Patient ID: Maureen King, female   DOB: 67 y.o.   MRN: [id number]   HPI Patient states that 1 area feels a lot better on my Achilles but I still am having a little bit of pain where it inserts into the back of the heel bone and I think if I got that better everything would be better   ROS      Objective:  Physical Exam  Neurovascular status intact with inflammation pain which is now in the area of the Achilles tendon insertion into the calcaneus strictly on the lateral side no central or medial involvement and no issue right now at the muscle tendon junction      Assessment:  Acute Achilles tendinitis right on the lateral side with central medial good and muscle tendon good     Plan:  H&P reviewed I did recommend that we can do a small injection of this area and I explained the risk of rupture associated with doing this but I do think it offers the chance of solving her problem and if this does not improve the condition we will be looking at shockwave PRP or at 1 point possible surgery.  Patient wants this done understanding risk and I did sterile prep and carefully injected the lateral side 3 mg dexamethasone Kenalog 5 mg Xylocaine tolerated well and advised on wearing her boot and reducing activity for the next few days

## 2022-08-04 ENCOUNTER — Other Ambulatory Visit: Payer: Self-pay | Admitting: Family Medicine

## 2022-08-04 MED ORDER — PAROXETINE HCL ER 12.5 MG PO TB24: 12.5000 mg | ORAL_TABLET | Freq: Every day | ORAL | 0 refills | Status: DC

## 2022-08-04 NOTE — Telephone Encounter (Signed)
Medication Refill - Medication: PARoxetine (PAXIL-CR) 12.5 MG 24 hr tablet   Has the patient contacted their pharmacy? No. Pt requests that the Rx be sent to another pharmacy  Preferred Pharmacy (with phone number or street name):  [id number] [address] [phone] Fax: [phone]    Has the patient been seen for an appointment in the last year OR does the patient have an upcoming appointment? Yes.    Agent: Please be advised that RX refills may take up to 3 business days. We ask that you follow-up with your pharmacy.

## 2022-08-04 NOTE — Telephone Encounter (Signed)
Requested Prescriptions  Pending Prescriptions Disp Refills   PARoxetine (PAXIL-CR) 12.5 MG 24 hr tablet 90 tablet 0    Sig: Take 1 tablet (12.5 mg total) by mouth daily.     Psychiatry:  Antidepressants - SSRI Passed - 08/04/2022 11:57 AM      Passed - Completed PHQ-2 or PHQ-9 in the last 360 days      Passed - Valid encounter within last 6 months    Recent Outpatient Visits           3 months ago [place], PA-C   5 months ago [facility] [employer] preventive visit   [place], [place], DO   7 months ago Insertional Achilles tendinopathy   [place], [place], DO   10 months ago Cutaneous abscess of trunk, unspecified site of trunk   [place], Karen, NP   12 months ago Recurrent major depressive disorder, in partial remission [facility])   [place], Megan P, DO       Future Appointments             In 3 weeks Wynetta Emery, Barb Merino, DO [place], [facility]

## 2022-08-14 ENCOUNTER — Ambulatory Visit (INDEPENDENT_AMBULATORY_CARE_PROVIDER_SITE_OTHER): Payer: No Typology Code available for payment source | Admitting: Family Medicine

## 2022-08-14 ENCOUNTER — Encounter: Payer: Self-pay | Admitting: Family Medicine

## 2022-08-14 VITALS — BP 145/83 | HR 61 | Temp 98.1°F | Ht 60.0 in | Wt 227.6 lb

## 2022-08-14 DIAGNOSIS — F430 Acute stress reaction: Secondary | ICD-10-CM | POA: Diagnosis not present

## 2022-08-14 MED ORDER — BUSPIRONE HCL 5 MG PO TABS: 5.0000 mg | ORAL_TABLET | Freq: Two times a day (BID) | ORAL | 3 refills | Status: DC

## 2022-08-14 MED ORDER — PAROXETINE HCL ER 12.5 MG PO TB24: 12.5000 mg | ORAL_TABLET | Freq: Every day | ORAL | 1 refills | Status: DC
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
## 2022-08-21 ENCOUNTER — Ambulatory Visit: Payer: No Typology Code available for payment source | Admitting: Physician Assistant

## 2022-08-21 ENCOUNTER — Ambulatory Visit: Payer: Self-pay

## 2022-08-21 ENCOUNTER — Encounter: Payer: Self-pay | Admitting: Family Medicine

## 2022-08-21 NOTE — Telephone Encounter (Signed)
Spoke with patient to make her aware that Dr Wynetta Emery had written her note via [place]. Patient verbalized understanding and requested to cancel her scheduled appointment for today.

## 2022-08-21 NOTE — Progress Notes (Deleted)
          Acute Office Visit   Patient: Maureen King   DOB: 1955/10/17   67 y.o. Female  MRN: [id number] Visit Date: 08/21/2022  Today's healthcare provider: Dani Gobble Nusaybah Ivie, PA-C  Introduced myself to the patient as a Journalist, newspaper and provided education on APPs in clinical practice.    No chief complaint on file.  Subjective    HPI    Medications: Outpatient Medications Prior to Visit  Medication Sig   aspirin EC 81 MG tablet Take 81 mg by mouth daily. Swallow whole.   B Complex-C (B-COMPLEX WITH VITAMIN C) tablet Take 1 tablet by mouth daily.   busPIRone (BUSPAR) 5 MG tablet Take 1 tablet (5 mg total) by mouth 2 (two) times daily.   Calcium Carb-Cholecalciferol (CALTRATE 600+D3 PO) Take 1 tablet by mouth daily.   Cholecalciferol (VITAMIN D3) 50 MCG (2000 UT) TABS Take 2,000 Units by mouth daily.   HOMEOPATHIC PRODUCTS PO Take 1 capsule by mouth daily. doTerra On Guard+ Protective Essential Oil Blend   Magnesium 250 MG TABS Take 250 mg by mouth daily.   Misc Natural Products (ELDERBERRY ZINC/VIT C/IMMUNE MT) Take 1 capsule by mouth daily.   nitroGLYCERIN (NITRO-DUR) 0.2 mg/hr patch Place 1 patch (0.2 mg total) onto the skin daily.   PARoxetine (PAXIL-CR) 12.5 MG 24 hr tablet Take 1 tablet (12.5 mg total) by mouth daily.   triamcinolone ointment (KENALOG) 0.5 % Apply 1 Application topically 2 (two) times daily.   No facility-administered medications prior to visit.    Review of Systems  {Labs  Heme  Chem  Endocrine  Serology  Results Review (optional):23779}   Objective    LMP  (LMP Unknown)  {Show previous vital signs (optional):23777}  Physical Exam    No results found for any visits on 08/21/22.  Assessment & Plan      No follow-ups on file.

## 2022-08-21 NOTE — Telephone Encounter (Signed)
     Chief Complaint: Stress is worse and pt. Had a good friend pass away last night. States Dr. Wynetta Emery had offered a work note and she would like to be written out of work through next week. Symptoms: Stress, not sleeping. Frequency: Weeks Pertinent Negatives: Patient denies  Disposition: [] ED /[] Urgent Care (no appt availability in office) / [] Appointment(In office/virtual)/ []  [facility]/ [] Home Care/ [] Refused Recommended Disposition /[] [facility]/ [x]  Follow-up with PCP Additional Notes: Please advise pt.  Answer Assessment - Initial Assessment Questions 1. CONCERN: "Did anything happen that prompted you to call today?"      Anxiety - not sleeping 2. ANXIETY SYMPTOMS: "Can you describe how you (your loved one; patient) have been feeling?" (e.g., tense, restless, panicky, anxious, keyed up, overwhelmed, sense of impending doom).      Stress 3. ONSET: "How long have you been feeling this way?" (e.g., hours, days, weeks)     For awhile 4. SEVERITY: "How would you rate the level of anxiety?" (e.g., 0 - 10; or mild, moderate, severe).     Severe 5. FUNCTIONAL IMPAIRMENT: "How have these feelings affected your ability to do daily activities?" "Have you had more difficulty than usual doing your normal daily activities?" (e.g., getting better, same, worse; self-care, school, work, interactions)     Work situation is worse 6. HISTORY: "Have you felt this way before?" "Have you ever been diagnosed with an anxiety problem in the past?" (e.g., generalized anxiety disorder, panic attacks, PTSD). If Yes, ask: "How was this problem treated?" (e.g., medicines, counseling, etc.)     Yes 7. RISK OF HARM - SUICIDAL IDEATION: "Do you ever have thoughts of hurting or killing yourself?" If Yes, ask:  "Do you have these feelings now?" "Do you have a plan on how you would do this?"     No 8. TREATMENT:  "What has been done so far to treat this anxiety?" (e.g., medicines, relaxation  strategies). "What has helped?"     Yes 9. TREATMENT - THERAPIST: "Do you have a counselor or therapist? Name?"     No 10. POTENTIAL TRIGGERS: "Do you drink caffeinated beverages (e.g., coffee, colas, teas), and how much daily?" "Do you drink alcohol or use any drugs?" "Have you started any new medicines recently?"       No 11. PATIENT SUPPORT: "Who is with you now?" "Who do you live with?" "Do you have family or friends who you can talk to?"        Yes 83. OTHER SYMPTOMS: "Do you have any other symptoms?" (e.g., feeling depressed, trouble concentrating, trouble sleeping, trouble breathing, palpitations or fast heartbeat, chest pain, sweating, nausea, or diarrhea)       Stress,anxiety 13. PREGNANCY: "Is there any chance you are pregnant?" "When was your last menstrual period?"       No  Protocols used: Anxiety and Panic Attack-A-AH

## 2022-08-21 NOTE — Telephone Encounter (Signed)
Note on her mychart.

## 2022-08-24 ENCOUNTER — Encounter: Payer: Self-pay | Admitting: Family Medicine

## 2022-08-28 ENCOUNTER — Encounter: Payer: Self-pay | Admitting: Family Medicine

## 2022-08-28 ENCOUNTER — Ambulatory Visit (INDEPENDENT_AMBULATORY_CARE_PROVIDER_SITE_OTHER): Payer: No Typology Code available for payment source | Admitting: Family Medicine

## 2022-08-28 VITALS — BP 126/83 | HR 70 | Temp 98.6°F | Ht 60.0 in | Wt 228.0 lb

## 2022-08-28 DIAGNOSIS — D2272 Melanocytic nevi of left lower limb, including hip: Secondary | ICD-10-CM | POA: Diagnosis not present

## 2022-08-28 DIAGNOSIS — F3341 Major depressive disorder, recurrent, in partial remission: Secondary | ICD-10-CM | POA: Diagnosis not present

## 2022-08-28 DIAGNOSIS — F430 Acute stress reaction: Secondary | ICD-10-CM | POA: Diagnosis not present

## 2022-08-28 DIAGNOSIS — L578 Other skin changes due to chronic exposure to nonionizing radiation: Secondary | ICD-10-CM | POA: Diagnosis not present

## 2022-08-28 DIAGNOSIS — Z86018 Personal history of other benign neoplasm: Secondary | ICD-10-CM | POA: Diagnosis not present

## 2022-08-28 DIAGNOSIS — L570 Actinic keratosis: Secondary | ICD-10-CM | POA: Diagnosis not present

## 2022-08-28 DIAGNOSIS — D485 Neoplasm of uncertain behavior of skin: Secondary | ICD-10-CM | POA: Diagnosis not present

## 2022-08-28 NOTE — Assessment & Plan Note (Signed)
Under good control on current regimen. Continue current regimen. Continue to monitor. Call with any concerns. Refills up to date.   

## 2022-08-28 NOTE — Progress Notes (Signed)
BP 126/83   Pulse 70   Temp 98.6 F (37 C) (Oral)   Ht 5' (1.524 m)   Wt 228 lb (103.4 kg)   LMP  (LMP Unknown)   SpO2 96%   BMI 44.53 kg/m    Subjective:    Patient ID: Maureen King, female    DOB: 04-May-1956, 67 y.o.   MRN: [id number]  HPI: Maureen King is a 67 y.o. female  Chief Complaint  Patient presents with   Stress   ANXIETY/STRESS- did well with her time off, was able to get some rest. She has given her notice. She is planning on finishing work 10/30/22. She is anxious about going back to work on Monday, but would like to try Duration: chronic Status:exacerbated Anxious mood: yes  Excessive worrying: yes Irritability: yes  Sweating: no Nausea: no Palpitations:no Hyperventilation: no Panic attacks: no Agoraphobia: no  Obscessions/compulsions: no Depressed mood: yes    08/28/2022    9:13 AM 08/14/2022    1:45 PM 02/27/2022    9:54 AM 10/02/2021    3:05 PM 08/07/2021    9:16 AM  Depression screen PHQ 2/9  Decreased Interest 2 0 1 1 0  Down, Depressed, Hopeless 2 0 0 1 0  PHQ - 2 Score 4 0 1 2 0  Altered sleeping 2 3 1 2 2   Tired, decreased energy 2 2 0 1 2  Change in appetite 2 3 1 1 1   Feeling bad or failure about yourself  2 0 0 0 0  Trouble concentrating 2 1 1 1  0  Moving slowly or fidgety/restless 1 0 0 0 0  Suicidal thoughts 0 0 0 0 0  PHQ-9 Score 15 9 4 7 5   Difficult doing work/chores Somewhat difficult Very difficult Not difficult at all Not difficult at all       08/28/2022    9:14 AM 08/14/2022    1:45 PM 02/27/2022    9:54 AM 10/02/2021    3:05 PM  GAD 7 : Generalized Anxiety Score  Nervous, Anxious, on Edge 2 2 1  0  Control/stop worrying 2 2 0 1  Worry too much - different things 2 2 1 1   Trouble relaxing 2 2 0 0  Restless 2 2 1  0  Easily annoyed or irritable 2 2 0 0  Afraid - awful might happen 2 2 1  0  Total GAD 7 Score 14 14 4 2   Anxiety Difficulty Very difficult Very difficult Somewhat difficult Not difficult at all    Anhedonia: no Weight changes: no Insomnia: yes   Hypersomnia: no Fatigue/loss of energy: yes Feelings of worthlessness: yes Feelings of guilt: yes Impaired concentration/indecisiveness: yes Suicidal ideations: no  Crying spells: yes Recent Stressors/Life Changes: yes   Relationship problems: no   Family stress: yes     Financial stress: no    Job stress: yes    Recent death/loss: no   Relevant past medical, surgical, family and social history reviewed and updated as indicated. Interim medical history since our last visit reviewed. Allergies and medications reviewed and updated.  Review of Systems  Constitutional: Negative.   Respiratory: Negative.    Cardiovascular: Negative.   Gastrointestinal: Negative.   Musculoskeletal: Negative.   Psychiatric/Behavioral:  Positive for agitation and dysphoric mood. Negative for behavioral problems, confusion, decreased concentration, hallucinations, self-injury, sleep disturbance and suicidal ideas. The patient is nervous/anxious. The patient is not hyperactive.     Per HPI unless specifically indicated above  Objective:    BP 126/83   Pulse 70   Temp 98.6 F (37 C) (Oral)   Ht 5' (1.524 m)   Wt 228 lb (103.4 kg)   LMP  (LMP Unknown)   SpO2 96%   BMI 44.53 kg/m   Wt Readings from Last 3 Encounters:  08/28/22 228 lb (103.4 kg)  08/14/22 227 lb 9.6 oz (103.2 kg)  02/27/22 225 lb 3.2 oz (102.2 kg)    Physical Exam Vitals and nursing note reviewed.  Constitutional:      General: She is not in acute distress.    Appearance: Normal appearance. She is obese. She is not ill-appearing, toxic-appearing or diaphoretic.  HENT:     Head: Normocephalic and atraumatic.     Right Ear: External ear normal.     Left Ear: External ear normal.     Nose: Nose normal.     Mouth/Throat:     Mouth: Mucous membranes are moist.     Pharynx: Oropharynx is clear.  Eyes:     General: No scleral icterus.       Right eye: No discharge.         Left eye: No discharge.     Extraocular Movements: Extraocular movements intact.     Conjunctiva/sclera: Conjunctivae normal.     Pupils: Pupils are equal, round, and reactive to light.  Cardiovascular:     Rate and Rhythm: Normal rate and regular rhythm.     Pulses: Normal pulses.     Heart sounds: Normal heart sounds. No murmur heard.    No friction rub. No gallop.  Pulmonary:     Effort: Pulmonary effort is normal. No respiratory distress.     Breath sounds: Normal breath sounds. No stridor. No wheezing, rhonchi or rales.  Chest:     Chest wall: No tenderness.  Musculoskeletal:        General: Normal range of motion.     Cervical back: Normal range of motion and neck supple.  Skin:    General: Skin is warm and dry.     Capillary Refill: Capillary refill takes less than 2 seconds.     Coloration: Skin is not jaundiced or pale.     Findings: No bruising, erythema, lesion or rash.  Neurological:     General: No focal deficit present.     Mental Status: She is alert and oriented to person, place, and time. Mental status is at baseline.  Psychiatric:        Mood and Affect: Mood is anxious.        Behavior: Behavior normal.        Thought Content: Thought content normal.        Judgment: Judgment normal.     Results for orders placed or performed in visit on 02/27/22  Microscopic Examination   Urine  Result Value Ref Range   WBC, UA 0-5 0 - 5 /hpf   RBC, Urine 0-2 0 - 2 /hpf   Epithelial Cells (non renal) 0-10 0 - 10 /hpf   Bacteria, UA None seen None seen/Few  CBC with Differential/Platelet  Result Value Ref Range   WBC 7.3 3.4 - 10.8 x10E3/uL   RBC 4.84 3.77 - 5.28 x10E6/uL   Hemoglobin 14.5 11.1 - 15.9 g/dL   Hematocrit 43.5 34.0 - 46.6 %   MCV 90 79 - 97 fL   MCH 30.0 26.6 - 33.0 pg   MCHC 33.3 31.5 - 35.7 g/dL   RDW 12.8 11.7 -  15.4 %   Platelets 251 150 - 450 x10E3/uL   Neutrophils 77 Not Estab. %   Lymphs 15 Not Estab. %   Monocytes 5 Not Estab. %    Eos 2 Not Estab. %   Basos 1 Not Estab. %   Neutrophils Absolute 5.6 1.4 - 7.0 x10E3/uL   Lymphocytes Absolute 1.1 0.7 - 3.1 x10E3/uL   Monocytes Absolute 0.4 0.1 - 0.9 x10E3/uL   EOS (ABSOLUTE) 0.1 0.0 - 0.4 x10E3/uL   Basophils Absolute 0.1 0.0 - 0.2 x10E3/uL   Immature Granulocytes 0 Not Estab. %   Immature Grans (Abs) 0.0 0.0 - 0.1 x10E3/uL  Comprehensive metabolic panel  Result Value Ref Range   Glucose 92 70 - 99 mg/dL   BUN 20 8 - 27 mg/dL   Creatinine, Ser 0.82 0.57 - 1.00 mg/dL   eGFR 79 >59 mL/min/1.73   BUN/Creatinine Ratio 24 12 - 28   Sodium 139 134 - 144 mmol/L   Potassium 4.2 3.5 - 5.2 mmol/L   Chloride 100 96 - 106 mmol/L   CO2 23 20 - 29 mmol/L   Calcium 9.8 8.7 - 10.3 mg/dL   Total Protein 7.0 6.0 - 8.5 g/dL   Albumin 5.0 (H) 3.9 - 4.9 g/dL   Globulin, Total 2.0 1.5 - 4.5 g/dL   Albumin/Globulin Ratio 2.5 (H) 1.2 - 2.2   Bilirubin Total 0.3 0.0 - 1.2 mg/dL   Alkaline Phosphatase 115 44 - 121 IU/L   AST 27 0 - 40 IU/L   ALT 22 0 - 32 IU/L  Lipid Panel w/o Chol/HDL Ratio  Result Value Ref Range   Cholesterol, Total 212 (H) 100 - 199 mg/dL   Triglycerides 106 0 - 149 mg/dL   HDL 93 >39 mg/dL   VLDL Cholesterol Cal 18 5 - 40 mg/dL   LDL Chol Calc (NIH) 101 (H) 0 - 99 mg/dL  Urinalysis, Routine w reflex microscopic  Result Value Ref Range   Specific Gravity, UA 1.025 1.005 - 1.030   pH, UA 5.5 5.0 - 7.5   Color, UA Yellow Yellow   Appearance Ur Clear Clear   Leukocytes,UA Negative Negative   Protein,UA Negative Negative/Trace   Glucose, UA Negative Negative   Ketones, UA Negative Negative   RBC, UA Trace (A) Negative   Bilirubin, UA Negative Negative   Urobilinogen, Ur 0.2 0.2 - 1.0 mg/dL   Nitrite, UA Negative Negative   Microscopic Examination See below:   TSH  Result Value Ref Range   TSH 1.530 0.450 - 4.500 uIU/mL  HIV Antibody (routine testing w rflx)  Result Value Ref Range   HIV Screen 4th Generation wRfx Non Reactive Non Reactive       Assessment & Plan:   Problem List Items Addressed This Visit       Other   Depression - Primary    Under good control on current regimen. Continue current regimen. Continue to monitor. Call with any concerns. Refills up to date.        Other Visit Diagnoses     Acute stress reaction       Still not doing great, but would like to try to go back. Planning on retiring in 2 months.        Follow up plan: Return in about 6 weeks (around 10/09/2022).

## 2022-09-25 ENCOUNTER — Ambulatory Visit: Payer: No Typology Code available for payment source | Admitting: Family Medicine

## 2022-10-30 IMAGING — US US PELVIS COMPLETE WITH TRANSVAGINAL
1 series · 13 of 25 positions shown · non-contrast
Comparison: Prior ultrasound from 2/lungs/14

CLINICAL DATA: Initial evaluation for postmenopausal spotting.



[Series 1: us pelvic complete with transvaginal · 89 acquisitions, 13 frames shown]
[im 1/89]
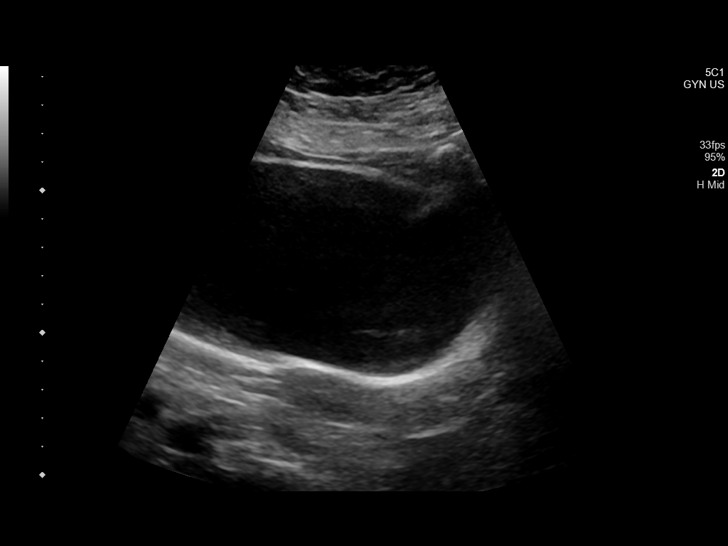
[im 8/89]
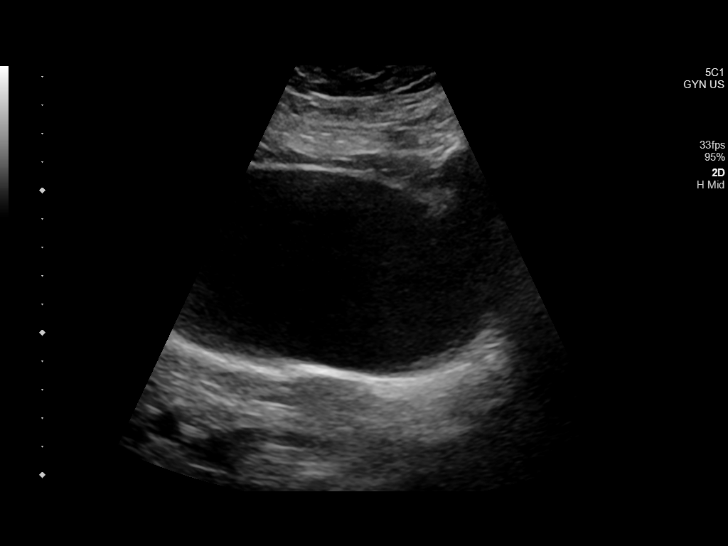
[im 15/89]
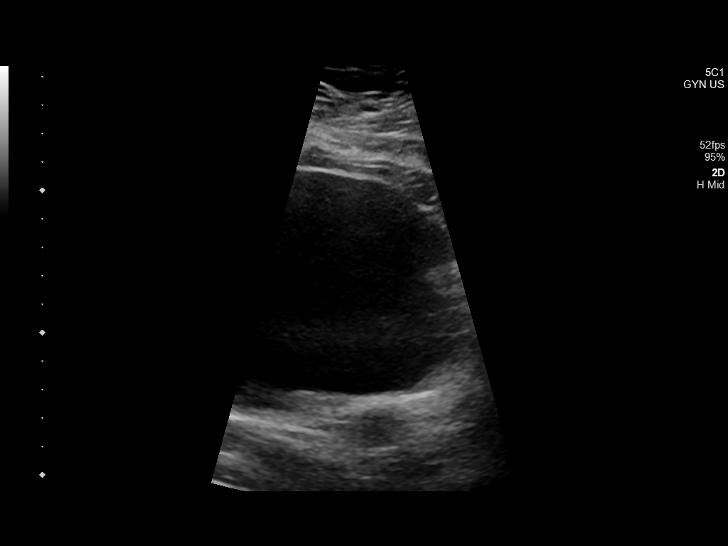
[im 23/89]
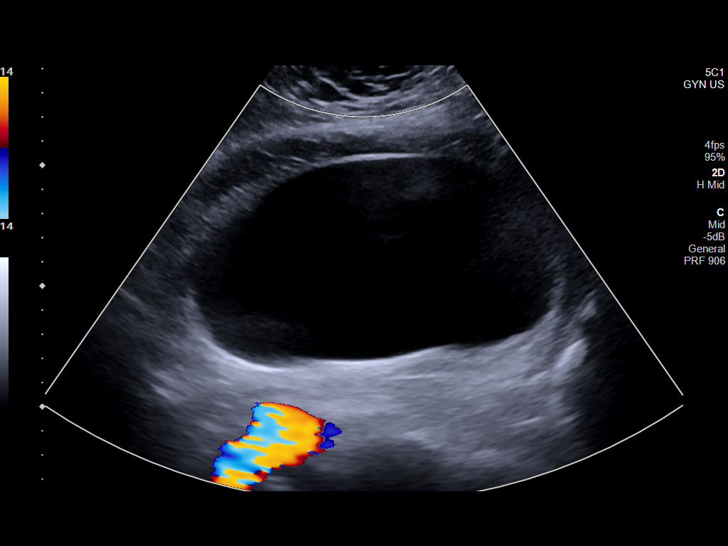
[im 30/89]
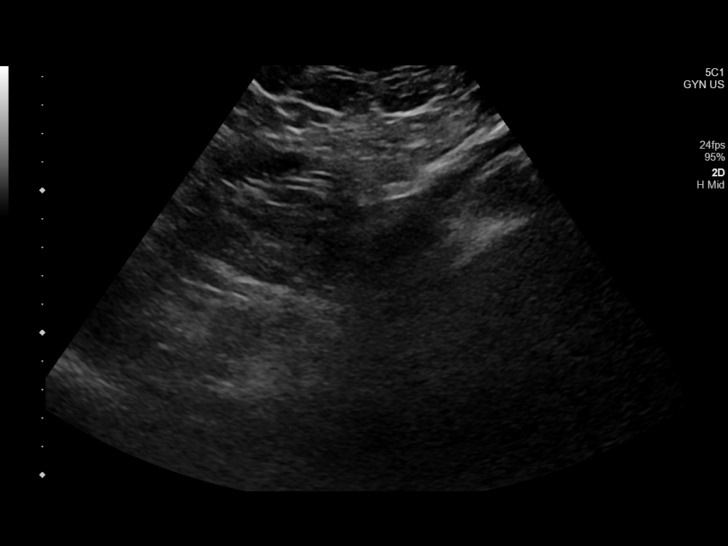
[im 37/89]
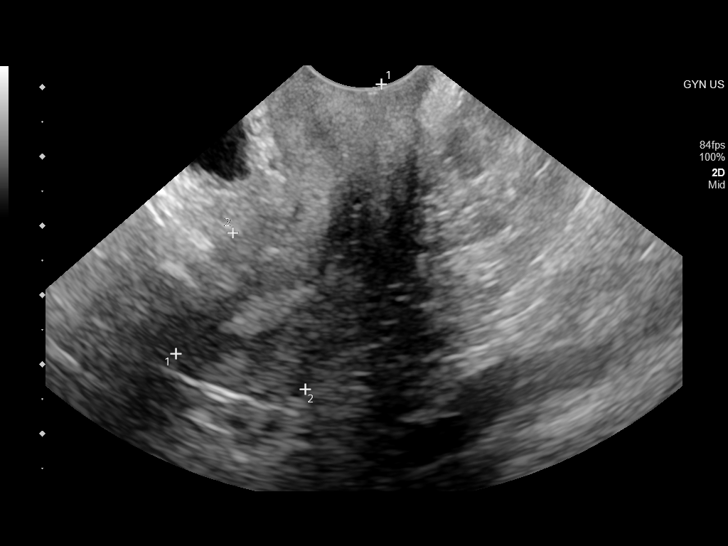
[im 45/89]
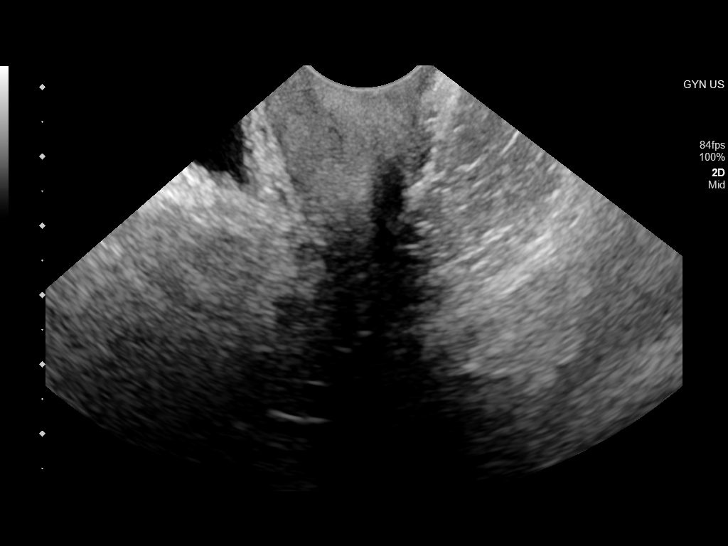
[im 52/89]
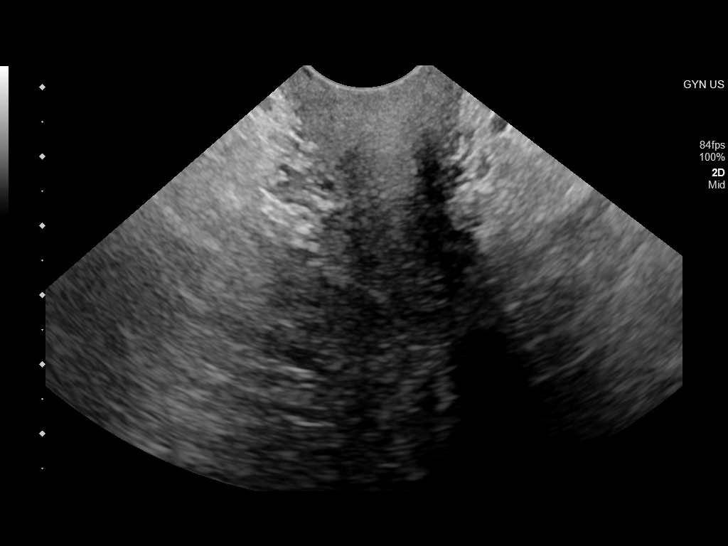
[im 59/89]
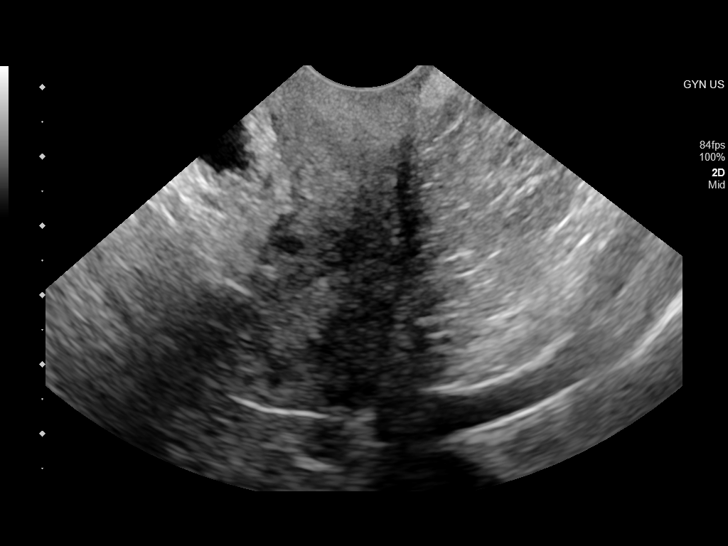
[im 67/89]
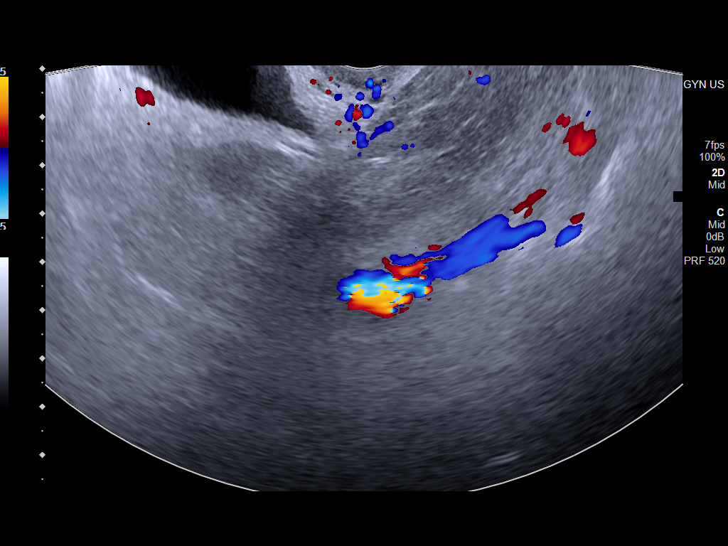
[im 74/89]
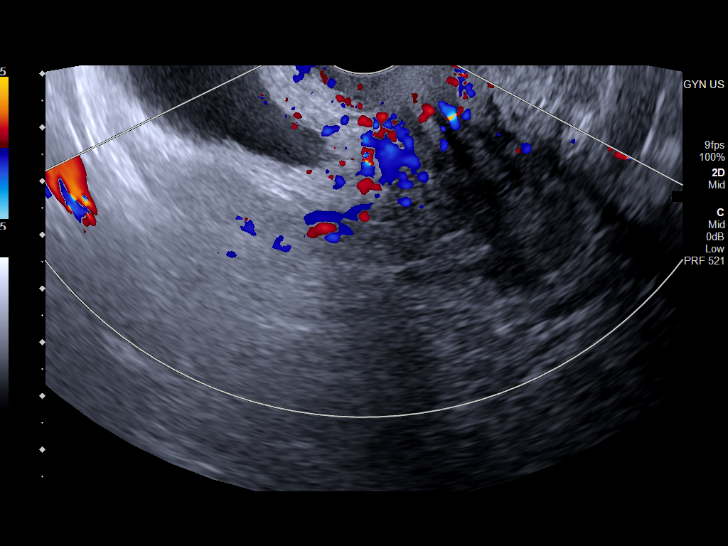
[im 81/89]
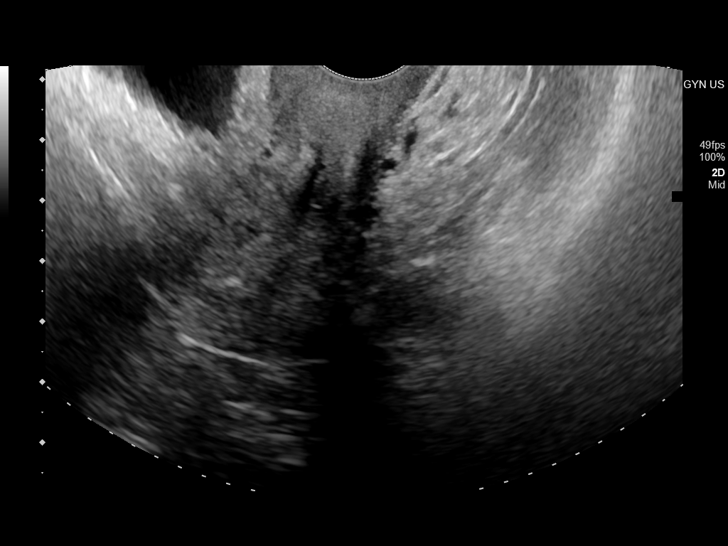
[im 89/89]
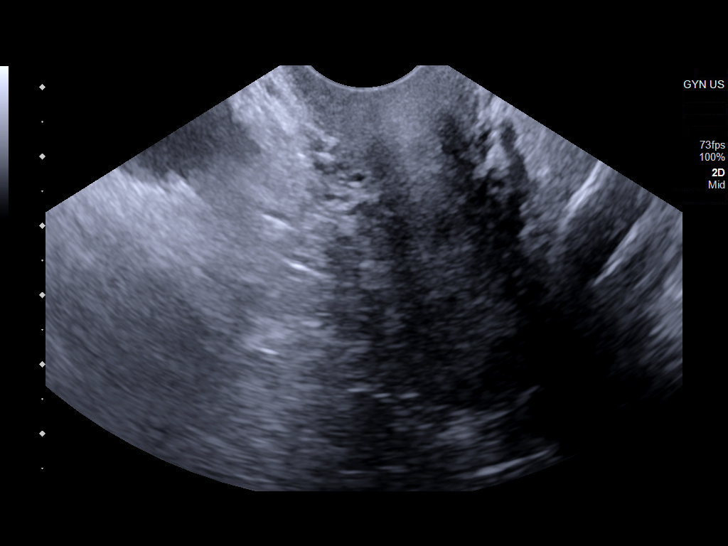

[13 of 25 positions shown; findings below may reference images not displayed]

FINDINGS: Uterus

Measurements: 4.9 x 2.5 x 2.9 cm = volume: 18.4 mL. Uterus is
anteverted. 5 x 4 x 5 mm hypoechoic lesion at the right anterior
uterine body most likely reflects a small intramural fibroid.

Endometrium

Thickness: 6.8 mm.  No focal abnormality visualized.

Right ovary

Not visualized.  No adnexal mass.

Left ovary

Not visualized.  No adnexal mass.

Other findings

No abnormal free fluid.
IMPRESSION: 1. Mildly thickened endometrial stripe measuring up to 6.8 mm in
thickness. In the setting of post-menopausal bleeding, endometrial
sampling is indicated to exclude carcinoma. If results are benign,
sonohysterogram should be considered for focal lesion work-up. (Ref:
Radiological Reasoning: Algorithmic Workup of Abnormal Vaginal
Bleeding with Endovaginal Sonography and Sonohysterography. AJR
6110; 191:S68-73)
2. 5 mm intramural fibroid at the right anterior uterine body.
3. Nonvisualization of either ovary. No adnexal mass or free fluid
within the pelvis.

## 2022-11-11 DIAGNOSIS — H5213 Myopia, bilateral: Secondary | ICD-10-CM | POA: Diagnosis not present

## 2022-11-11 DIAGNOSIS — Z0100 Encounter for examination of eyes and vision without abnormal findings: Secondary | ICD-10-CM | POA: Diagnosis not present

## 2023-01-14 ENCOUNTER — Encounter: Payer: Self-pay | Admitting: Family Medicine

## 2023-02-09 DIAGNOSIS — R2681 Unsteadiness on feet: Secondary | ICD-10-CM | POA: Diagnosis not present

## 2023-02-09 DIAGNOSIS — R002 Palpitations: Secondary | ICD-10-CM | POA: Diagnosis not present

## 2023-02-09 DIAGNOSIS — Z008 Encounter for other general examination: Secondary | ICD-10-CM | POA: Diagnosis not present

## 2023-02-09 DIAGNOSIS — Z6841 Body Mass Index (BMI) 40.0 and over, adult: Secondary | ICD-10-CM | POA: Diagnosis not present

## 2023-02-23 ENCOUNTER — Ambulatory Visit: Payer: No Typology Code available for payment source | Attending: Family Medicine

## 2023-02-23 ENCOUNTER — Ambulatory Visit (INDEPENDENT_AMBULATORY_CARE_PROVIDER_SITE_OTHER): Payer: No Typology Code available for payment source | Admitting: Family Medicine

## 2023-02-23 VITALS — BP 113/74 | HR 60 | Ht 60.0 in | Wt 219.6 lb

## 2023-02-23 DIAGNOSIS — Z23 Encounter for immunization: Secondary | ICD-10-CM | POA: Diagnosis not present

## 2023-02-23 DIAGNOSIS — Z1231 Encounter for screening mammogram for malignant neoplasm of breast: Secondary | ICD-10-CM | POA: Diagnosis not present

## 2023-02-23 DIAGNOSIS — F3341 Major depressive disorder, recurrent, in partial remission: Secondary | ICD-10-CM

## 2023-02-23 DIAGNOSIS — R21 Rash and other nonspecific skin eruption: Secondary | ICD-10-CM | POA: Diagnosis not present

## 2023-02-23 DIAGNOSIS — B351 Tinea unguium: Secondary | ICD-10-CM

## 2023-02-23 DIAGNOSIS — R002 Palpitations: Secondary | ICD-10-CM

## 2023-02-23 MED ORDER — CICLOPIROX 8 % EX SOLN: Freq: Every day | CUTANEOUS | 12 refills | Status: DC

## 2023-02-23 MED ORDER — CLOBETASOL PROPIONATE 0.05 % EX OINT: 1.0000 | TOPICAL_OINTMENT | Freq: Two times a day (BID) | CUTANEOUS | 3 refills | Status: DC

## 2023-02-23 MED ORDER — PAROXETINE HCL ER 12.5 MG PO TB24: 12.5000 mg | ORAL_TABLET | Freq: Every day | ORAL | 1 refills | Status: DC

## 2023-02-23 NOTE — Assessment & Plan Note (Signed)
Under good control on current regimen. Continue current regimen. Continue to monitor. Call with any concerns. Refills given.   

## 2023-02-23 NOTE — Progress Notes (Signed)
BP 113/74   Pulse 60   Ht 5' (1.524 m)   Wt 219 lb 9.6 oz (99.6 kg)   LMP  (LMP Unknown)   SpO2 98%   BMI 42.89 kg/m    Subjective:    Patient ID: Maureen King, female    DOB: [birth year], 67 y.o.   MRN: [id number]  HPI: Maureen King is a 67 y.o. female  Chief Complaint  Patient presents with   Depression   Rash    Patient says she has an ongoing issue with a rash on her R leg that has been going on for months now. Patient says she was using a prescription cream that was prescribed in a previous visit. Patient says the cream isn't helping. Patient says she thinks it could be her detergent, but she is not sure. Patient thinks the rash has gotten worse.   Nail Problem    Patient says she thinks she may have a toenail fungus on her R foot. Patient says she first noticed it around the same time she noticed the rash on her R leg.    DEPRESSION Mood status: controlled Satisfied with current treatment?: yes Symptom severity: mild  Duration of current treatment : chronic Side effects: no Medication compliance: excellent compliance Psychotherapy/counseling: no  Previous psychiatric medications: paxil Depressed mood: no Anxious mood: no Anhedonia: no Significant weight loss or gain: yes Insomnia: no  Fatigue: yes Feelings of worthlessness or guilt: no Impaired concentration/indecisiveness: no Suicidal ideations: no Hopelessness: no Crying spells: no    02/23/2023    9:53 AM 08/28/2022    9:13 AM 08/14/2022    1:45 PM 02/27/2022    9:54 AM 10/02/2021    3:05 PM  Depression screen PHQ 2/9  Decreased Interest 0 2 0 1 1  Down, Depressed, Hopeless 0 2 0 0 1  PHQ - 2 Score 0 4 0 1 2  Altered sleeping 1 2 3 1 2   Tired, decreased energy 0 2 2 0 1  Change in appetite 0 2 3 1 1   Feeling bad or failure about yourself  0 2 0 0 0  Trouble concentrating 0 2 1 1 1   Moving slowly or fidgety/restless 0 1 0 0 0  Suicidal thoughts 0 0 0 0 0  PHQ-9 Score 1 15 9 4 7    Difficult doing work/chores Not difficult at all Somewhat difficult Very difficult Not difficult at all Not difficult at all   RASH Duration:  chronic  Location: R lower leg  Itching: yes Burning: yes Redness: no Oozing: no Scaling: no Blisters: no Painful: no Fevers: no Change in detergents/soaps/personal care products: no Recent illness: no Recent travel:no History of same: yes Context: worse Alleviating factors: nothing Treatments attempted:hydrocortisone cream, benadryl, OTC anit-fungal, and lotion/moisturizer Shortness of breath: no  Throat/tongue swelling: no Myalgias/arthralgias: no  PALPITATIONS Duration: 2-3 months Symptom description: rapid heart rate Duration of episode: seconds Frequency: recurrentl Activity when event occurred: just woke up from a nap Related to exertion: no Dyspnea: no Chest pain: no Syncope: no Anxiety/stress: no Nausea/vomiting: no Diaphoresis: no Coronary artery disease: no Congestive heart failure: no Arrhythmia:no Thyroid disease: no Caffeine intake: none Status:  fluctuating Treatments attempted:none  Relevant past medical, surgical, family and social history reviewed and updated as indicated. Interim medical history since our last visit reviewed. Allergies and medications reviewed and updated.  Review of Systems  Constitutional: Negative.   Respiratory: Negative.    Cardiovascular: Negative.   Gastrointestinal: Negative.  Skin:  Positive for rash. Negative for color change, pallor and wound.  Neurological: Negative.   Psychiatric/Behavioral: Negative.      Per HPI unless specifically indicated above     Objective:    BP 113/74   Pulse 60   Ht 5' (1.524 m)   Wt 219 lb 9.6 oz (99.6 kg)   LMP  (LMP Unknown)   SpO2 98%   BMI 42.89 kg/m   Wt Readings from Last 3 Encounters:  02/23/23 219 lb 9.6 oz (99.6 kg)  08/28/22 228 lb (103.4 kg)  08/14/22 227 lb 9.6 oz (103.2 kg)    Physical Exam Vitals and nursing  note reviewed.  Constitutional:      General: She is not in acute distress.    Appearance: Normal appearance. She is obese. She is not ill-appearing, toxic-appearing or diaphoretic.  HENT:     Head: Normocephalic and atraumatic.     Right Ear: External ear normal.     Left Ear: External ear normal.     Nose: Nose normal.     Mouth/Throat:     Mouth: Mucous membranes are moist.     Pharynx: Oropharynx is clear.  Eyes:     General: No scleral icterus.       Right eye: No discharge.        Left eye: No discharge.     Extraocular Movements: Extraocular movements intact.     Conjunctiva/sclera: Conjunctivae normal.     Pupils: Pupils are equal, round, and reactive to light.  Cardiovascular:     Rate and Rhythm: Normal rate and regular rhythm.     Pulses: Normal pulses.     Heart sounds: Normal heart sounds. No murmur heard.    No friction rub. No gallop.  Pulmonary:     Effort: Pulmonary effort is normal. No respiratory distress.     Breath sounds: Normal breath sounds. No stridor. No wheezing, rhonchi or rales.  Chest:     Chest wall: No tenderness.  Musculoskeletal:        General: Normal range of motion.     Cervical back: Normal range of motion and neck supple.  Skin:    General: Skin is warm and dry.     Capillary Refill: Capillary refill takes less than 2 seconds.     Coloration: Skin is not jaundiced or pale.     Findings: Rash (raised, red, skin on anterior R shin) present. No bruising, erythema or lesion.  Neurological:     General: No focal deficit present.     Mental Status: She is alert and oriented to person, place, and time. Mental status is at baseline.  Psychiatric:        Mood and Affect: Mood normal.        Behavior: Behavior normal.        Thought Content: Thought content normal.        Judgment: Judgment normal.     Results for orders placed or performed in visit on 01/14/23  HM COLONOSCOPY  Result Value Ref Range   HM Colonoscopy See Report (in  chart) See Report (in chart), Patient Reported      Assessment & Plan:   Problem List Items Addressed This Visit       Other   Depression - Primary    Under good control on current regimen. Continue current regimen. Continue to monitor. Call with any concerns. Refills given.        Relevant Medications   PARoxetine (  PAXIL-CR) 12.5 MG 24 hr tablet   Other Visit Diagnoses     Onychomycosis       Will treat with penlac. Call with any concerns or if not getting better.   Relevant Medications   ciclopirox (PENLAC) 8 % solution   Rash       Will treat with clobetasol- not better with triamcinalone. If not improving consider punch biopsy.   Palpitations       Will check zio- await results. Treat as needed. May need sleep study   Relevant Orders   LONG TERM MONITOR (3-14 DAYS)   Encounter for screening mammogram for malignant neoplasm of breast       Mammogram ordered today.   Relevant Orders   MM 3D SCREENING MAMMOGRAM BILATERAL BREAST   Needs flu shot       Flu shot given today.   Relevant Orders   Flu Vaccine Trivalent High Dose (Fluad) (Completed)        Follow up plan: Return whenever she'd like for her physcial (after 9/30).

## 2023-03-05 DIAGNOSIS — R002 Palpitations: Secondary | ICD-10-CM

## 2023-03-17 ENCOUNTER — Encounter: Payer: Self-pay | Admitting: Podiatry

## 2023-03-17 ENCOUNTER — Ambulatory Visit: Payer: No Typology Code available for payment source | Admitting: Podiatry

## 2023-03-17 DIAGNOSIS — M7661 Achilles tendinitis, right leg: Secondary | ICD-10-CM | POA: Diagnosis not present

## 2023-03-17 DIAGNOSIS — M79676 Pain in unspecified toe(s): Secondary | ICD-10-CM

## 2023-03-17 NOTE — Progress Notes (Signed)
4 cc of injection was given to the posterior Kager's fat pad.  No complication noted.  I will see her back again in 4 weeks for another PRP injection.

## 2023-04-14 ENCOUNTER — Ambulatory Visit (INDEPENDENT_AMBULATORY_CARE_PROVIDER_SITE_OTHER): Payer: No Typology Code available for payment source | Admitting: Podiatry

## 2023-04-14 ENCOUNTER — Encounter: Payer: Self-pay | Admitting: Podiatry

## 2023-04-14 VITALS — Ht 60.0 in | Wt 219.6 lb

## 2023-04-14 DIAGNOSIS — M7661 Achilles tendinitis, right leg: Secondary | ICD-10-CM | POA: Diagnosis not present

## 2023-04-14 NOTE — Progress Notes (Signed)
Subjective:  Patient ID: Maureen King, female    DOB: 01/07/1956,  MRN: [id number]  Chief Complaint  Patient presents with   Tendonitis    Pt is here to f/u on tendinitis in here achilles. Pt states she was here 4 weeks ago for the same issue,received an injection and the pain is no better worst than before, here to talk about different options other than injections.    67 y.o. female presents with the above complaint.  Patient presents with continuous pain to the right heel.  The.  PRP injection did not help.  She wanted to discuss next treatment plan.  At this point no nothing has helped she would like to discuss surgical options.   Review of Systems: Negative except as noted in the HPI. Denies N/V/F/Ch.  Past Medical History:  Diagnosis Date   Anxiety    Depression    GERD (gastroesophageal reflux disease)    Insomnia    Obesity    Palpitations     Current Outpatient Medications:    aspirin EC 81 MG tablet, Take 81 mg by mouth daily. Swallow whole., Disp: , Rfl:    B Complex-C (B-COMPLEX WITH VITAMIN C) tablet, Take 1 tablet by mouth daily., Disp: , Rfl:    Calcium Carb-Cholecalciferol (CALTRATE 600+D3 PO), Take 1 tablet by mouth daily., Disp: , Rfl:    Cholecalciferol (VITAMIN D3) 50 MCG (2000 UT) TABS, Take 2,000 Units by mouth daily., Disp: , Rfl:    ciclopirox (PENLAC) 8 % solution, Apply topically at bedtime. Apply over nail and surrounding skin. Apply daily over previous coat. After seven (7) days, may remove with alcohol and continue cycle., Disp: 6.6 mL, Rfl: 12   clobetasol ointment (TEMOVATE) 0.05 %, Apply 1 Application topically 2 (two) times daily., Disp: 30 g, Rfl: 3   HOMEOPATHIC PRODUCTS PO, Take 1 capsule by mouth daily. doTerra On Guard+ Protective Essential Oil Blend, Disp: , Rfl:    Magnesium 250 MG TABS, Take 250 mg by mouth daily., Disp: , Rfl:    PARoxetine (PAXIL-CR) 12.5 MG 24 hr tablet, Take 1 tablet (12.5 mg total) by mouth daily., Disp: 90  tablet, Rfl: 1   triamcinolone ointment (KENALOG) 0.5 %, Apply 1 Application topically 2 (two) times daily., Disp: 30 g, Rfl: 0  Social History   Tobacco Use  Smoking Status Never  Smokeless Tobacco Never    Allergies  Allergen Reactions   Wound Dressing Adhesive Rash   Penicillins Rash   Neosporin [Neomycin-Polymyxin-Gramicidin] Rash   Objective:  There were no vitals filed for this visit. Body mass index is 42.89 kg/m. Constitutional Well developed. Well nourished.  Vascular Dorsalis pedis pulses palpable bilaterally. Posterior tibial pulses palpable bilaterally. Capillary refill normal to all digits.  No cyanosis or clubbing noted. Pedal hair growth normal.  Neurologic Normal speech. Oriented to person, place, and time. Epicritic sensation to light touch grossly present bilaterally.  Dermatologic Nails well groomed and normal in appearance. No open wounds. No skin lesions.  Orthopedic: Pain on palpation to the right Achilles pain with dorsiflexion of the ankle joint positive Haglund deformity noted.  Gastrocnemius equinus noted.  No pain at the posterior tibial tendon ATFL.   Radiographs: X-rays were reviewed shows posterior heel spurring Assessment:   1. Achilles tendinitis, right leg    Plan:  Patient was evaluated and treated and all questions answered.  Right Achilles tendinitis with posterior heel spurring with Haglund's deformity and gastrocnemius equinus noted -All questions and concerns were discussed  with the patient extensive detail -Given the amount of pain that she would benefit from surgical intervention.  However prior to undergoing surgery patient will need MRI -MRI was ordered  No follow-ups on file.

## 2023-04-18 ENCOUNTER — Ambulatory Visit
Admission: RE | Admit: 2023-04-18 | Discharge: 2023-04-18 | Disposition: A | Discharge status: Home / Self Care | Payer: No Typology Code available for payment source | Source: Ambulatory Visit | Attending: Podiatry | Admitting: Podiatry

## 2023-04-18 DIAGNOSIS — M25571 Pain in right ankle and joints of right foot: Secondary | ICD-10-CM | POA: Diagnosis not present

## 2023-04-18 DIAGNOSIS — M7671 Peroneal tendinitis, right leg: Secondary | ICD-10-CM | POA: Diagnosis not present

## 2023-04-18 DIAGNOSIS — S93491A Sprain of other ligament of right ankle, initial encounter: Secondary | ICD-10-CM | POA: Diagnosis not present

## 2023-04-18 DIAGNOSIS — M7661 Achilles tendinitis, right leg: Secondary | ICD-10-CM

## 2023-05-04 ENCOUNTER — Encounter: Payer: Self-pay | Admitting: Family Medicine

## 2023-05-04 ENCOUNTER — Ambulatory Visit: Payer: No Typology Code available for payment source | Admitting: Family Medicine

## 2023-05-04 VITALS — BP 138/77 | HR 58 | Ht 60.5 in | Wt 221.0 lb

## 2023-05-04 DIAGNOSIS — F32A Depression, unspecified: Secondary | ICD-10-CM | POA: Diagnosis not present

## 2023-05-04 DIAGNOSIS — Z136 Encounter for screening for cardiovascular disorders: Secondary | ICD-10-CM

## 2023-05-04 DIAGNOSIS — Z0000 Encounter for general adult medical examination without abnormal findings: Secondary | ICD-10-CM | POA: Diagnosis not present

## 2023-05-04 DIAGNOSIS — K219 Gastro-esophageal reflux disease without esophagitis: Secondary | ICD-10-CM | POA: Diagnosis not present

## 2023-05-04 DIAGNOSIS — F3341 Major depressive disorder, recurrent, in partial remission: Secondary | ICD-10-CM | POA: Diagnosis not present

## 2023-05-04 MED ORDER — PAROXETINE HCL ER 12.5 MG PO TB24: 12.5000 mg | ORAL_TABLET | Freq: Every day | ORAL | 1 refills | Status: DC

## 2023-05-04 NOTE — Patient Instructions (Signed)
Preventative Services:  Health Risk Assessment and Personalized Prevention Plan: Done today Bone Mass Measurements: up to date Breast Cancer Screening: up to date CVD Screening: done today Cervical Cancer Screening: N/A Colon Cancer Screening: up to date Depression Screening: done today Diabetes Screening: done today Glaucoma Screening: see your eye doctor Hepatitis B vaccine: N/A Hepatitis C screening: up to date HIV Screening: up to date Flu Vaccine: up to date Lung cancer Screening: N/A Obesity Screening: Done today Pneumonia Vaccines (2): up to date STI Screening: N/A

## 2023-05-04 NOTE — Progress Notes (Signed)
BP 138/77   Pulse (!) 58   Ht 5' 0.5" (1.537 m)   Wt 221 lb (100.2 kg)   LMP  (LMP Unknown)   SpO2 99%   BMI 42.45 kg/m    Subjective:    Patient ID: Maureen King, female    DOB: 1955-06-05, 67 y.o.   MRN: [id number]  HPI: Maureen King is a 67 y.o. female presenting on 05/04/2023 for comprehensive medical examination. Current medical complaints include:  DEPRESSION Mood status: controlled Satisfied with current treatment?: yes Symptom severity: mild  Duration of current treatment : chronic Side effects: no Medication compliance: excellent compliance Psychotherapy/counseling: no  Previous psychiatric medications: paxil Depressed mood: yes Anxious mood: no Anhedonia: no Significant weight loss or gain: no Insomnia: no  Fatigue: no Feelings of worthlessness or guilt: no Impaired concentration/indecisiveness: no Suicidal ideations: no Hopelessness: no Crying spells: no    05/04/2023    9:55 AM 02/23/2023    9:53 AM 08/28/2022    9:13 AM 08/14/2022    1:45 PM 02/27/2022    9:54 AM  Depression screen PHQ 2/9  Decreased Interest 0 0 2 0 1  Down, Depressed, Hopeless 0 0 2 0 0  PHQ - 2 Score 0 0 4 0 1  Altered sleeping 0 1 2 3 1   Tired, decreased energy 0 0 2 2 0  Change in appetite 0 0 2 3 1   Feeling bad or failure about yourself  0 0 2 0 0  Trouble concentrating 0 0 2 1 1   Moving slowly or fidgety/restless 0 0 1 0 0  Suicidal thoughts 0 0 0 0 0  PHQ-9 Score 0 1 15 9 4   Difficult doing work/chores Not difficult at all Not difficult at all Somewhat difficult Very difficult Not difficult at all    GERD GERD control status: controlled Satisfied with current treatment? yes Heartburn frequency: rarely Medication side effects: no  Medication compliance: excellent Dysphagia: no Odynophagia:  no Hematemesis: no Blood in stool: no EGD: no   Menopausal Symptoms: no  Functional Status Survey: Is the patient deaf or have difficulty hearing?: No Does  the patient have difficulty seeing, even when wearing glasses/contacts?: No Does the patient have difficulty concentrating, remembering, or making decisions?: No Does the patient have difficulty walking or climbing stairs?: Yes Does the patient have difficulty dressing or bathing?: No Does the patient have difficulty doing errands alone such as visiting a doctor's office or shopping?: No     05/04/2023    9:54 AM 02/23/2023    9:53 AM 08/14/2022    1:44 PM 02/27/2022    9:20 AM 01/05/2022   10:28 AM  Fall Risk   Falls in the past year? 1 0 0 0 1  Number falls in past yr: 1 0 0 0 1  Injury with Fall? 0 0 0 0 0  Risk for fall due to : History of fall(s) No Fall Risks No Fall Risks Orthopedic patient;No Fall Risks History of fall(s)  Follow up Falls evaluation completed Falls evaluation completed Falls evaluation completed Falls evaluation completed Falls evaluation completed    Depression Screen    05/04/2023    9:55 AM 02/23/2023    9:53 AM 08/28/2022    9:13 AM 08/14/2022    1:45 PM 02/27/2022    9:54 AM  Depression screen PHQ 2/9  Decreased Interest 0 0 2 0 1  Down, Depressed, Hopeless 0 0 2 0 0  PHQ - 2 Score 0 0  4 0 1  Altered sleeping 0 1 2 3 1   Tired, decreased energy 0 0 2 2 0  Change in appetite 0 0 2 3 1   Feeling bad or failure about yourself  0 0 2 0 0  Trouble concentrating 0 0 2 1 1   Moving slowly or fidgety/restless 0 0 1 0 0  Suicidal thoughts 0 0 0 0 0  PHQ-9 Score 0 1 15 9 4   Difficult doing work/chores Not difficult at all Not difficult at all Somewhat difficult Very difficult Not difficult at all     Advanced Directives Does patient have a HCPOA?    yes If yes, name and contact information:  Does patient have a living will or MOST form?  yes  Past Medical History:  Past Medical History:  Diagnosis Date   Anxiety    Depression    GERD (gastroesophageal reflux disease)    Insomnia    Obesity    Palpitations     Surgical History:  Past Surgical  History:  Procedure Laterality Date   COLONOSCOPY     COLPOSCOPY N/A 05/30/2020   Procedure: COLPOSCOPY;  Surgeon: Natale Milch, MD;  Location: [facility];  Service: Gynecology;  Laterality: N/A;   CYSTOSCOPY N/A 08/29/2020   Procedure: CYSTOSCOPY;  Surgeon: Natale Milch, MD;  Location: [facility];  Service: Gynecology;  Laterality: N/A;   HYSTEROSCOPY WITH D & C N/A 05/30/2020   Procedure: DILATATION AND CURETTAGE /HYSTEROSCOPY;  Surgeon: Natale Milch, MD;  Location: [facility];  Service: Gynecology;  Laterality: N/A;   NO PAST SURGERIES     ROBOTIC ASSISTED LAPAROSCOPIC HYSTERECTOMY AND SALPINGECTOMY Bilateral 08/29/2020   Procedure: XI ROBOTIC ASSISTED LAPAROSCOPIC HYSTERECTOMY AND SALPINGECTOMY, BILATERAL OOPHORECTOMY;  Surgeon: Natale Milch, MD;  Location: [facility];  Service: Gynecology;  Laterality: Bilateral;    Medications:  Current Outpatient Medications on File Prior to Visit  Medication Sig   aspirin EC 81 MG tablet Take 81 mg by mouth daily. Swallow whole.   ciclopirox (PENLAC) 8 % solution Apply topically at bedtime. Apply over nail and surrounding skin. Apply daily over previous coat. After seven (7) days, may remove with alcohol and continue cycle.   clobetasol ointment (TEMOVATE) 0.05 % Apply 1 Application topically 2 (two) times daily.   HOMEOPATHIC PRODUCTS PO Take 1 capsule by mouth daily. doTerra On Guard+ Protective Essential Oil Blend   meloxicam (MOBIC) 15 MG tablet Take 15 mg by mouth daily.   Multiple Vitamins-Minerals (CENTRUM SILVER WOMEN 50+ PO) Take 1 tablet by mouth.   OVER THE COUNTER MEDICATION Take 1 tablet by mouth daily. doTerra Tumeric   triamcinolone ointment (KENALOG) 0.5 % Apply 1 Application topically 2 (two) times daily.   No current facility-administered medications on file prior to visit.    Allergies:  Allergies  Allergen Reactions   Wound Dressing Adhesive Rash   Penicillins Rash   Neosporin  [Neomycin-Polymyxin-Gramicidin] Rash    Social History:  Social History   Socioeconomic History   Marital status: Married    Spouse name: Not on file   Number of children: Not on file   Years of education: Not on file   Highest education level: Some college, no degree  Occupational History   Not on file  Tobacco Use   Smoking status: Never   Smokeless tobacco: Never  Vaping Use   Vaping status: Never Used  Substance and Sexual Activity   Alcohol use: Yes    Alcohol/week: 0.0 standard drinks of  alcohol    Comment: on occasion (4x/year)   Drug use: No   Sexual activity: Not Currently  Other Topics Concern   Not on file  Social History Narrative   Not on file   Social Determinants of Health   Financial Resource Strain: Low Risk  (02/23/2023)   Overall Financial Resource Strain (CARDIA)    Difficulty of Paying Living Expenses: Not hard at all  Food Insecurity: No Food Insecurity (02/23/2023)   Hunger Vital Sign    Worried About Running Out of Food in the Last Year: Never true    Ran Out of Food in the Last Year: Never true  Transportation Needs: No Transportation Needs (02/23/2023)   PRAPARE - Administrator, Civil Service (Medical): No    Lack of Transportation (Non-Medical): No  Physical Activity: Unknown (02/23/2023)   Exercise Vital Sign    Days of Exercise per Week: 0 days    Minutes of Exercise per Session: Not on file  Stress: No Stress Concern Present (02/23/2023)   [employer] of Occupational Health - Occupational Stress Questionnaire    Feeling of Stress : Only a little  Social Connections: Socially Integrated (02/23/2023)   Social Connection and Isolation Panel [NHANES]    Frequency of Communication with Friends and Family: More than three times a week    Frequency of Social Gatherings with Friends and Family: Once a week    Attends Religious Services: More than 4 times per year    Active Member of [employer] or Organizations: Yes    Attends Probation officer: More than 4 times per year    Marital Status: Married  Catering manager Violence: Not on file   Social History   Tobacco Use  Smoking Status Never  Smokeless Tobacco Never   Social History   Substance and Sexual Activity  Alcohol Use Yes   Alcohol/week: 0.0 standard drinks of alcohol   Comment: on occasion (4x/year)    Family History:  Family History  Problem Relation Age of Onset   Hypertension Mother    Osteoporosis Mother    Heart disease Father 81       Stent   Lung disease Father    Idiopathic pulmonary fibrosis Father    Hypertension Brother    Cancer Maternal Grandmother        gallbladder   Heart disease Paternal Grandfather        MI   Breast cancer Neg Hx     Past medical history, surgical history, medications, allergies, family history and social history reviewed with patient today and changes made to appropriate areas of the chart.   Review of Systems  Constitutional: Negative.   HENT: Negative.    Eyes: Negative.   Respiratory: Negative.    Cardiovascular: Negative.   Gastrointestinal: Negative.   Genitourinary: Negative.   Musculoskeletal: Negative.   Skin: Negative.   Neurological: Negative.   Endo/Heme/Allergies: Negative.   Psychiatric/Behavioral: Negative.      All other ROS negative except what is listed above and in the HPI.      Objective:    BP 138/77   Pulse (!) 58   Ht 5' 0.5" (1.537 m)   Wt 221 lb (100.2 kg)   LMP  (LMP Unknown)   SpO2 99%   BMI 42.45 kg/m   Wt Readings from Last 3 Encounters:  05/04/23 221 lb (100.2 kg)  04/14/23 219 lb 9.6 oz (99.6 kg)  02/23/23 219 lb 9.6 oz (99.6 kg)  Physical Exam Vitals and nursing note reviewed.  Constitutional:      General: She is not in acute distress.    Appearance: Normal appearance. She is obese. She is not ill-appearing, toxic-appearing or diaphoretic.  HENT:     Head: Normocephalic and atraumatic.     Right Ear: Tympanic membrane, ear  canal and external ear normal. There is no impacted cerumen.     Left Ear: Tympanic membrane, ear canal and external ear normal. There is no impacted cerumen.     Nose: Nose normal. No congestion or rhinorrhea.     Mouth/Throat:     Mouth: Mucous membranes are moist.     Pharynx: Oropharynx is clear. No oropharyngeal exudate or posterior oropharyngeal erythema.  Eyes:     General: No scleral icterus.       Right eye: No discharge.        Left eye: No discharge.     Extraocular Movements: Extraocular movements intact.     Conjunctiva/sclera: Conjunctivae normal.     Pupils: Pupils are equal, round, and reactive to light.  Neck:     Vascular: No carotid bruit.  Cardiovascular:     Rate and Rhythm: Normal rate and regular rhythm.     Pulses: Normal pulses.     Heart sounds: No murmur heard.    No friction rub. No gallop.  Pulmonary:     Effort: Pulmonary effort is normal. No respiratory distress.     Breath sounds: Normal breath sounds. No stridor. No wheezing, rhonchi or rales.  Chest:     Chest wall: No tenderness.  Abdominal:     General: Abdomen is flat. Bowel sounds are normal. There is no distension.     Palpations: Abdomen is soft. There is no mass.     Tenderness: There is no abdominal tenderness. There is no right CVA tenderness, left CVA tenderness, guarding or rebound.     Hernia: No hernia is present.  Genitourinary:    Comments: Breast and pelvic exams deferred with shared decision making Musculoskeletal:        General: No swelling, tenderness, deformity or signs of injury.     Cervical back: Normal range of motion and neck supple. No rigidity. No muscular tenderness.     Right lower leg: No edema.     Left lower leg: No edema.  Lymphadenopathy:     Cervical: No cervical adenopathy.  Skin:    General: Skin is warm and dry.     Capillary Refill: Capillary refill takes less than 2 seconds.     Coloration: Skin is not jaundiced or pale.     Findings: No bruising,  erythema, lesion or rash.  Neurological:     General: No focal deficit present.     Mental Status: She is alert and oriented to person, place, and time. Mental status is at baseline.     Cranial Nerves: No cranial nerve deficit.     Sensory: No sensory deficit.     Motor: No weakness.     Coordination: Coordination normal.     Gait: Gait normal.     Deep Tendon Reflexes: Reflexes normal.  Psychiatric:        Mood and Affect: Mood normal.        Behavior: Behavior normal.        Thought Content: Thought content normal.        Judgment: Judgment normal.        05/04/2023   10:15 AM 02/27/2022  10:04 AM  6CIT Screen  What Year? 0 points 0 points  What month? 0 points 0 points  What time? 0 points 0 points  Count back from 20 0 points 0 points  Months in reverse 0 points 0 points  Repeat phrase 0 points 0 points  Total Score 0 points 0 points    Results for orders placed or performed in visit on 01/14/23  HM COLONOSCOPY  Result Value Ref Range   [facility] Colonoscopy See Report (in chart) See Report (in chart), Patient Reported      Assessment & Plan:   Problem List Items Addressed This Visit       Digestive   GERD (gastroesophageal reflux disease)    Under good control on current regimen. Continue current regimen. Continue to monitor. Call with any concerns. Refills given.        Relevant Orders   CBC with Differential/Platelet   Comprehensive metabolic panel   TSH     Other   Depression    Under good control on current regimen. Continue current regimen. Continue to monitor. Call with any concerns. Refills given. Consider stopping next visit if still doing well.        Relevant Medications   PARoxetine (PAXIL-CR) 12.5 MG 24 hr tablet   Other Relevant Orders   CBC with Differential/Platelet   Comprehensive metabolic panel   TSH   Other Visit Diagnoses     Encounter for annual wellness exam in Medicare patient    -  Primary   Preventative care discussed today  as below.   Routine general medical examination at a health care facility       Vaccines up to date. Screening labs checked today. Mammo, colonoscopy and DEXA up to date. Continue diet and exercise. Call with any concerns. Cont to monitor   Screening for cardiovascular condition       Labs drawn today. Await results.   Relevant Orders   Comprehensive metabolic panel   Lipid Panel w/o Chol/HDL Ratio        Preventative Services:  Health Risk Assessment and Personalized Prevention Plan: Done today Bone Mass Measurements: up to date Breast Cancer Screening: up to date CVD Screening: done today Cervical Cancer Screening: N/A Colon Cancer Screening: up to date Depression Screening: done today Diabetes Screening: done today Glaucoma Screening: see your eye doctor Hepatitis B vaccine: N/A Hepatitis C screening: up to date HIV Screening: up to date Flu Vaccine: up to date Lung cancer Screening: N/A Obesity Screening: Done today Pneumonia Vaccines (2): up to date STI Screening: N/A  Follow up plan: Return in about 6 months (around 11/02/2023).   LABORATORY TESTING:  - Pap smear: not applicable  IMMUNIZATIONS:   - Tdap: Tetanus vaccination status reviewed: last tetanus booster within 10 years. - Influenza: Up to date - Pneumovax: Up to date - Prevnar: Up to date - Zostavax vaccine: Up to date  SCREENING: -Mammogram: Up to date  - Colonoscopy: Up to date  - Bone Density: Up to date   PATIENT COUNSELING:   Advised to take 1 mg of folate supplement per day if capable of pregnancy.   Sexuality: Discussed sexually transmitted diseases, partner selection, use of condoms, avoidance of unintended pregnancy  and contraceptive alternatives.   Advised to avoid cigarette smoking.  I discussed with the patient that most people either abstain from alcohol or drink within safe limits (<=14/week and <=4 drinks/occasion for males, <=7/weeks and <= 3 drinks/occasion for females) and that  the  risk for alcohol disorders and other health effects rises proportionally with the number of drinks per week and how often a drinker exceeds daily limits.  Discussed cessation/primary prevention of drug use and availability of treatment for abuse.   Diet: Encouraged to adjust caloric intake to maintain  or achieve ideal body weight, to reduce intake of dietary saturated fat and total fat, to limit sodium intake by avoiding high sodium foods and not adding table salt, and to maintain adequate dietary potassium and calcium preferably from fresh fruits, vegetables, and low-fat dairy products.    stressed the importance of regular exercise  Injury prevention: Discussed safety belts, safety helmets, smoke detector, smoking near bedding or upholstery.   Dental health: Discussed importance of regular tooth brushing, flossing, and dental visits.    NEXT PREVENTATIVE PHYSICAL DUE IN 1 YEAR. Return in about 6 months (around 11/02/2023).

## 2023-05-04 NOTE — Assessment & Plan Note (Signed)
Under good control on current regimen. Continue current regimen. Continue to monitor. Call with any concerns. Refills given. Consider stopping next visit if still doing well.

## 2023-05-04 NOTE — Assessment & Plan Note (Signed)
Under good control on current regimen. Continue current regimen. Continue to monitor. Call with any concerns. Refills given.   

## 2023-05-05 LAB — CBC WITH DIFFERENTIAL/PLATELET
Basophils Absolute: 0 10*3/uL (ref 0.0–0.2)
Basos: 1 %
EOS (ABSOLUTE): 0.3 10*3/uL (ref 0.0–0.4)
Eos: 4 %
Hematocrit: 42 % (ref 34.0–46.6)
Hemoglobin: 13.2 g/dL (ref 11.1–15.9)
Immature Grans (Abs): 0 10*3/uL (ref 0.0–0.1)
Immature Granulocytes: 0 %
Lymphocytes Absolute: 0.9 10*3/uL (ref 0.7–3.1)
Lymphs: 14 %
MCH: 29.4 pg (ref 26.6–33.0)
MCHC: 31.4 g/dL — ABNORMAL LOW (ref 31.5–35.7)
MCV: 94 fL (ref 79–97)
Monocytes Absolute: 0.4 10*3/uL (ref 0.1–0.9)
Monocytes: 6 %
Neutrophils Absolute: 4.4 10*3/uL (ref 1.4–7.0)
Neutrophils: 75 %
Platelets: 277 10*3/uL (ref 150–450)
RBC: 4.49 x10E6/uL (ref 3.77–5.28)
RDW: 12.6 % (ref 11.7–15.4)
WBC: 5.9 10*3/uL (ref 3.4–10.8)

## 2023-05-05 LAB — COMPREHENSIVE METABOLIC PANEL
ALT: 21 [IU]/L (ref 0–32)
AST: 21 [IU]/L (ref 0–40)
Albumin: 3.9 g/dL (ref 3.9–4.9)
Alkaline Phosphatase: 111 [IU]/L (ref 44–121)
BUN/Creatinine Ratio: 26 (ref 12–28)
BUN: 17 mg/dL (ref 8–27)
Bilirubin Total: 0.3 mg/dL (ref 0.0–1.2)
CO2: 26 mmol/L (ref 20–29)
Calcium: 9.4 mg/dL (ref 8.7–10.3)
Chloride: 104 mmol/L (ref 96–106)
Creatinine, Ser: 0.66 mg/dL (ref 0.57–1.00)
Globulin, Total: 2.1 g/dL (ref 1.5–4.5)
Glucose: 88 mg/dL (ref 70–99)
Potassium: 4.3 mmol/L (ref 3.5–5.2)
Sodium: 140 mmol/L (ref 134–144)
Total Protein: 6 g/dL (ref 6.0–8.5)
eGFR: 97 mL/min/{1.73_m2} (ref >59)

## 2023-05-05 LAB — LIPID PANEL W/O CHOL/HDL RATIO
Cholesterol, Total: 226 mg/dL — ABNORMAL HIGH (ref 100–199)
HDL: 77 mg/dL (ref >39)
LDL Chol Calc (NIH): 117 mg/dL — ABNORMAL HIGH (ref 0–99)
Triglycerides: 188 mg/dL — ABNORMAL HIGH (ref 0–149)
VLDL Cholesterol Cal: 32 mg/dL (ref 5–40)

## 2023-05-05 LAB — TSH: TSH: 1.33 u[IU]/mL (ref 0.450–4.500)

## 2023-05-19 ENCOUNTER — Ambulatory Visit (INDEPENDENT_AMBULATORY_CARE_PROVIDER_SITE_OTHER): Payer: No Typology Code available for payment source | Admitting: Podiatry

## 2023-05-19 DIAGNOSIS — M62461 Contracture of muscle, right lower leg: Secondary | ICD-10-CM

## 2023-05-19 DIAGNOSIS — M7661 Achilles tendinitis, right leg: Secondary | ICD-10-CM | POA: Diagnosis not present

## 2023-05-19 DIAGNOSIS — Z01818 Encounter for other preprocedural examination: Secondary | ICD-10-CM

## 2023-05-19 DIAGNOSIS — M9261 Juvenile osteochondrosis of tarsus, right ankle: Secondary | ICD-10-CM

## 2023-05-19 NOTE — Progress Notes (Unsigned)
Subjective:  Patient ID: Maureen King, female    DOB: 09/25/1955,  MRN: [id number]  Chief Complaint  Patient presents with   Tendonitis    She is here to go over the MRI of right leg. She is some better with meloxicam.    67 y.o. female presents with the above complaint.  Patient presents with continuous pain to the right heel.  She is here to go over her MRI results her pain has not gotten better she would like to discuss treatment options at this time.   Review of Systems: Negative except as noted in the HPI. Denies N/V/F/Ch.  Past Medical History:  Diagnosis Date   Anxiety    Depression    GERD (gastroesophageal reflux disease)    Insomnia    Obesity    Palpitations     Current Outpatient Medications:    aspirin EC 81 MG tablet, Take 81 mg by mouth daily. Swallow whole., Disp: , Rfl:    ciclopirox (PENLAC) 8 % solution, Apply topically at bedtime. Apply over nail and surrounding skin. Apply daily over previous coat. After seven (7) days, may remove with alcohol and continue cycle., Disp: 6.6 mL, Rfl: 12   clobetasol ointment (TEMOVATE) 0.05 %, Apply 1 Application topically 2 (two) times daily., Disp: 30 g, Rfl: 3   HOMEOPATHIC PRODUCTS PO, Take 1 capsule by mouth daily. doTerra On Guard+ Protective Essential Oil Blend, Disp: , Rfl:    meloxicam (MOBIC) 15 MG tablet, Take 15 mg by mouth daily., Disp: , Rfl:    Multiple Vitamins-Minerals (CENTRUM SILVER WOMEN 50+ PO), Take 1 tablet by mouth., Disp: , Rfl:    OVER THE COUNTER MEDICATION, Take 1 tablet by mouth daily. doTerra Tumeric, Disp: , Rfl:    PARoxetine (PAXIL-CR) 12.5 MG 24 hr tablet, Take 1 tablet (12.5 mg total) by mouth daily., Disp: 90 tablet, Rfl: 1   triamcinolone ointment (KENALOG) 0.5 %, Apply 1 Application topically 2 (two) times daily., Disp: 30 g, Rfl: 0  Social History   Tobacco Use  Smoking Status Never  Smokeless Tobacco Never    Allergies  Allergen Reactions   Wound Dressing Adhesive Rash    Penicillins Rash   Neosporin [Neomycin-Polymyxin-Gramicidin] Rash   Objective:  There were no vitals filed for this visit. There is no height or weight on file to calculate BMI. Constitutional Well developed. Well nourished.  Vascular Dorsalis pedis pulses palpable bilaterally. Posterior tibial pulses palpable bilaterally. Capillary refill normal to all digits.  No cyanosis or clubbing noted. Pedal hair growth normal.  Neurologic Normal speech. Oriented to person, place, and time. Epicritic sensation to light touch grossly present bilaterally.  Dermatologic Nails well groomed and normal in appearance. No open wounds. No skin lesions.  Orthopedic: Pain on palpation to the right Achilles pain with dorsiflexion of the ankle joint positive Haglund deformity noted.  Gastrocnemius equinus noted.  No pain at the posterior tibial tendon ATFL.   Radiographs: X-rays were reviewed shows posterior heel spurring  IMPRESSION: 1. 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 Reactive marrow edema in the calcaneus at the tendon insertion there is also retrocalcaneal bursitis. 2. Mild peroneal tenosynovitis without tear. 3. Remote sprain and partial tear of the anterior talofibular ligament. Assessment:   1. Achilles tendinitis, right leg   2. Haglund's deformity, right   3. Gastrocnemius equinus, right   4. Encounter for preoperative examination for general surgical procedure     Plan:  Patient was evaluated and treated and all  questions answered.  Right Achilles tendinitis with posterior heel spurring with Haglund's deformity and gastrocnemius equinus noted -All questions and concerns were discussed with the patient extensive detail -MRI was reviewed with the patient in extensive detail which does show some thickening of the tendon at this time given the findings of the MRI along with her pain which has not resolved I believe she would benefit from surgical  intervention I discussed my preoperative intra or postoperative plan with the patient extensive detail she states understanding like to proceed with surgery -I discussed my surgical plan which includes right Achilles tendon repair with debridement of the posterior spurring/resection with gastrocnemius recession to address the equinus.  She agrees with the plan like to proceed with surgery -Informed surgical risk consent was reviewed and read aloud to the patient.  I reviewed the films.  I have discussed my findings with the patient in great detail.  I have discussed all risks including but not limited to infection, stiffness, scarring, limp, disability, deformity, damage to blood vessels and nerves, numbness, poor healing, need for braces, arthritis, chronic pain, amputation, death.  All benefits and realistic expectations discussed in great detail.  I have made no promises as to the outcome.  I have provided realistic expectations.  I have offered the patient a 2nd opinion, which they have declined and assured me they preferred to proceed despite the risks   No follow-ups on file.

## 2023-06-21 ENCOUNTER — Ambulatory Visit
Admission: RE | Admit: 2023-06-21 | Discharge: 2023-06-21 | Disposition: A | Discharge status: Home / Self Care | Payer: No Typology Code available for payment source | Source: Ambulatory Visit | Attending: Family Medicine | Admitting: Family Medicine

## 2023-06-21 DIAGNOSIS — Z1231 Encounter for screening mammogram for malignant neoplasm of breast: Secondary | ICD-10-CM | POA: Insufficient documentation

## 2023-06-22 ENCOUNTER — Encounter: Payer: Self-pay | Admitting: Family Medicine

## 2023-06-25 ENCOUNTER — Telehealth: Payer: Self-pay | Admitting: Podiatry

## 2023-06-25 NOTE — Telephone Encounter (Signed)
DOS- 07/26/23  CALCANEAL OSTECTOMY [id number] GASTROCNEMIUS RECESS [id number] ACHILLES REPAIR [id number]  [facility] EFFECTIVE DATE- 09/29/21  SPOKE WITH MAX P FROM [facility] AND HE STATED THAT PRIOR AUTH IS NOT REQUIRED FOR CPT CODES [id number] AND [id number].  CALL REF #: [id number]

## 2023-06-29 ENCOUNTER — Encounter: Payer: Self-pay | Admitting: Family Medicine

## 2023-06-29 ENCOUNTER — Encounter: Payer: Self-pay | Admitting: Podiatry

## 2023-07-20 ENCOUNTER — Ambulatory Visit (INDEPENDENT_AMBULATORY_CARE_PROVIDER_SITE_OTHER): Payer: No Typology Code available for payment source | Admitting: Podiatry

## 2023-07-20 DIAGNOSIS — M9261 Juvenile osteochondrosis of tarsus, right ankle: Secondary | ICD-10-CM | POA: Diagnosis not present

## 2023-07-20 DIAGNOSIS — M7661 Achilles tendinitis, right leg: Secondary | ICD-10-CM | POA: Diagnosis not present

## 2023-07-20 DIAGNOSIS — Z01818 Encounter for other preprocedural examination: Secondary | ICD-10-CM

## 2023-07-20 DIAGNOSIS — M62461 Contracture of muscle, right lower leg: Secondary | ICD-10-CM | POA: Diagnosis not present

## 2023-07-20 NOTE — Progress Notes (Signed)
 Subjective:  Patient ID: Maureen King, female    DOB: 1955-07-16,  MRN: [id number]  Chief Complaint  Patient presents with   Achilles tendinitis,    Pt stated that she is here because she has some questions about her upcoming surgery    68 y.o. female presents with the above complaint.  Patient presents with continuous pain to the right heel.  She is here to go over her MRI results her pain has not gotten better she would like to discuss treatment options at this time.   Review of Systems: Negative except as noted in the HPI. Denies N/V/F/Ch.  Past Medical History:  Diagnosis Date   Anxiety    Depression    GERD (gastroesophageal reflux disease)    Insomnia    Obesity    Palpitations     Current Outpatient Medications:    aspirin EC 81 MG tablet, Take 81 mg by mouth daily. Swallow whole., Disp: , Rfl:    ciclopirox (PENLAC) 8 % solution, Apply topically at bedtime. Apply over nail and surrounding skin. Apply daily over previous coat. After seven (7) days, may remove with alcohol and continue cycle., Disp: 6.6 mL, Rfl: 12   clobetasol ointment (TEMOVATE) 0.05 %, Apply 1 Application topically 2 (two) times daily., Disp: 30 g, Rfl: 3   HOMEOPATHIC PRODUCTS PO, Take 1 capsule by mouth daily. doTerra On Guard+ Protective Essential Oil Blend, Disp: , Rfl:    meloxicam (MOBIC) 15 MG tablet, Take 15 mg by mouth daily., Disp: , Rfl:    Multiple Vitamins-Minerals (CENTRUM SILVER WOMEN 50+ PO), Take 1 tablet by mouth., Disp: , Rfl:    OVER THE COUNTER MEDICATION, Take 1 tablet by mouth daily. doTerra Tumeric, Disp: , Rfl:    PARoxetine (PAXIL-CR) 12.5 MG 24 hr tablet, Take 1 tablet (12.5 mg total) by mouth daily., Disp: 90 tablet, Rfl: 1   triamcinolone ointment (KENALOG) 0.5 %, Apply 1 Application topically 2 (two) times daily., Disp: 30 g, Rfl: 0  Social History   Tobacco Use  Smoking Status Never  Smokeless Tobacco Never    Allergies  Allergen Reactions   Wound Dressing  Adhesive Rash   Penicillins Rash   Neosporin [Neomycin-Polymyxin-Gramicidin] Rash   Objective:  There were no vitals filed for this visit. There is no height or weight on file to calculate BMI. Constitutional Well developed. Well nourished.  Vascular Dorsalis pedis pulses palpable bilaterally. Posterior tibial pulses palpable bilaterally. Capillary refill normal to all digits.  No cyanosis or clubbing noted. Pedal hair growth normal.  Neurologic Normal speech. Oriented to person, place, and time. Epicritic sensation to light touch grossly present bilaterally.  Dermatologic Nails well groomed and normal in appearance. No open wounds. No skin lesions.  Orthopedic: Pain on palpation to the right Achilles pain with dorsiflexion of the ankle joint positive Haglund deformity noted.  Gastrocnemius equinus noted.  No pain at the posterior tibial tendon ATFL.   Radiographs: X-rays were reviewed shows posterior heel spurring  IMPRESSION: 1. 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 Reactive marrow edema in the calcaneus at the tendon insertion there is also retrocalcaneal bursitis. 2. Mild peroneal tenosynovitis without tear. 3. Remote sprain and partial tear of the anterior talofibular ligament. Assessment:   No diagnosis found.   Plan:  Patient was evaluated and treated and all questions answered.  Right Achilles tendinitis with posterior heel spurring with Haglund's deformity and gastrocnemius equinus noted -All questions and concerns were discussed with the patient extensive detail -  MRI was reviewed with the patient in extensive detail which does show some thickening of the tendon at this time given the findings of the MRI along with her pain which has not resolved I believe she would benefit from surgical intervention I discussed my preoperative intra or postoperative plan with the patient extensive detail she states understanding like to proceed with  surgery -I discussed my surgical plan which includes right Achilles tendon repair with debridement of the posterior spurring/resection with gastrocnemius recession to address the equinus.  She agrees with the plan like to proceed with surgery -Informed surgical risk consent was reviewed and read aloud to the patient.  I reviewed the films.  I have discussed my findings with the patient in great detail.  I have discussed all risks including but not limited to infection, stiffness, scarring, limp, disability, deformity, damage to blood vessels and nerves, numbness, poor healing, need for braces, arthritis, chronic pain, amputation, death.  All benefits and realistic expectations discussed in great detail.  I have made no promises as to the outcome.  I have provided realistic expectations.  I have offered the patient a 2nd opinion, which they have declined and assured me they preferred to proceed despite the risks   No follow-ups on file.

## 2023-07-26 DIAGNOSIS — M62461 Contracture of muscle, right lower leg: Secondary | ICD-10-CM | POA: Diagnosis not present

## 2023-07-26 DIAGNOSIS — M7661 Achilles tendinitis, right leg: Secondary | ICD-10-CM | POA: Diagnosis not present

## 2023-07-28 ENCOUNTER — Other Ambulatory Visit: Payer: Self-pay | Admitting: Podiatry

## 2023-07-28 DIAGNOSIS — M216X1 Other acquired deformities of right foot: Secondary | ICD-10-CM | POA: Diagnosis not present

## 2023-07-28 DIAGNOSIS — M9261 Juvenile osteochondrosis of tarsus, right ankle: Secondary | ICD-10-CM | POA: Diagnosis not present

## 2023-07-28 DIAGNOSIS — S86111A Strain of other muscle(s) and tendon(s) of posterior muscle group at lower leg level, right leg, initial encounter: Secondary | ICD-10-CM | POA: Diagnosis not present

## 2023-07-28 DIAGNOSIS — M7731 Calcaneal spur, right foot: Secondary | ICD-10-CM | POA: Diagnosis not present

## 2023-07-28 DIAGNOSIS — M7661 Achilles tendinitis, right leg: Secondary | ICD-10-CM | POA: Diagnosis not present

## 2023-07-28 DIAGNOSIS — G8918 Other acute postprocedural pain: Secondary | ICD-10-CM | POA: Diagnosis not present

## 2023-07-28 MED ORDER — IBUPROFEN 800 MG PO TABS: 800.0000 mg | ORAL_TABLET | Freq: Four times a day (QID) | ORAL | 1 refills | Status: DC | PRN

## 2023-07-28 MED ORDER — OXYCODONE-ACETAMINOPHEN 5-325 MG PO TABS: 1.0000 | ORAL_TABLET | ORAL | 0 refills | Status: DC | PRN

## 2023-08-03 ENCOUNTER — Ambulatory Visit (INDEPENDENT_AMBULATORY_CARE_PROVIDER_SITE_OTHER): Payer: No Typology Code available for payment source | Admitting: Podiatry

## 2023-08-03 DIAGNOSIS — M9261 Juvenile osteochondrosis of tarsus, right ankle: Secondary | ICD-10-CM

## 2023-08-03 DIAGNOSIS — M7661 Achilles tendinitis, right leg: Secondary | ICD-10-CM

## 2023-08-03 DIAGNOSIS — M62461 Contracture of muscle, right lower leg: Secondary | ICD-10-CM

## 2023-08-03 NOTE — Progress Notes (Signed)
  Subjective:  Patient ID: Maureen King, female    DOB: 01/09/1956,  MRN: [id number]  Chief Complaint  Patient presents with   Routine Post Op    1 DOS 07/28/23 --- RIGHT ACHILLES TENDONITIS REPAI WITH GASTROCNEMIUS RECESSION WITH HAGLUND RESECTION WITH FIXATION    DOS: 07/28/2023 Procedure: Right Achilles tendinitis with gastrocnemius recession with Haglund's resection  68 y.o. female returns for post-op check.  Patient states that she is doing well  Review of Systems: Negative except as noted in the HPI. Denies N/V/F/Ch.  Past Medical History:  Diagnosis Date   Anxiety    Depression    GERD (gastroesophageal reflux disease)    Insomnia    Obesity    Palpitations     Current Outpatient Medications:    aspirin EC 81 MG tablet, Take 81 mg by mouth daily. Swallow whole., Disp: , Rfl:    ciclopirox (PENLAC) 8 % solution, Apply topically at bedtime. Apply over nail and surrounding skin. Apply daily over previous coat. After seven (7) days, may remove with alcohol and continue cycle., Disp: 6.6 mL, Rfl: 12   clobetasol ointment (TEMOVATE) 0.05 %, Apply 1 Application topically 2 (two) times daily., Disp: 30 g, Rfl: 3   HOMEOPATHIC PRODUCTS PO, Take 1 capsule by mouth daily. doTerra On Guard+ Protective Essential Oil Blend, Disp: , Rfl:    ibuprofen (ADVIL) 800 MG tablet, Take 1 tablet (800 mg total) by mouth every 6 (six) hours as needed., Disp: 60 tablet, Rfl: 1   meloxicam (MOBIC) 15 MG tablet, Take 15 mg by mouth daily., Disp: , Rfl:    Multiple Vitamins-Minerals (CENTRUM SILVER WOMEN 50+ PO), Take 1 tablet by mouth., Disp: , Rfl:    OVER THE COUNTER MEDICATION, Take 1 tablet by mouth daily. doTerra Tumeric, Disp: , Rfl:    oxyCODONE-acetaminophen (PERCOCET) 5-325 MG tablet, Take 1 tablet by mouth every 4 (four) hours as needed for severe pain (pain score 7-10)., Disp: 30 tablet, Rfl: 0   PARoxetine (PAXIL-CR) 12.5 MG 24 hr tablet, Take 1 tablet (12.5 mg total) by mouth daily.,  Disp: 90 tablet, Rfl: 1   triamcinolone ointment (KENALOG) 0.5 %, Apply 1 Application topically 2 (two) times daily., Disp: 30 g, Rfl: 0  Social History   Tobacco Use  Smoking Status Never  Smokeless Tobacco Never    Allergies  Allergen Reactions   Wound Dressing Adhesive Rash   Penicillins Rash   Neosporin [Neomycin-Polymyxin-Gramicidin] Rash   Objective:  There were no vitals filed for this visit. There is no height or weight on file to calculate BMI. Constitutional Well developed. Well nourished.  Vascular Foot warm and well perfused. Capillary refill normal to all digits.   Neurologic Normal speech. Oriented to person, place, and time. Epicritic sensation to light touch grossly present bilaterally.  Dermatologic Skin healing well without signs of infection. Skin edges well coapted without signs of infection.  Orthopedic: Tenderness to palpation noted about the surgical site.   Radiographs: None Assessment:   1. Achilles tendinitis, right leg   2. Haglund's deformity, right   3. Gastrocnemius equinus, right    Plan:  Patient was evaluated and treated and all questions answered.  S/p foot surgery right -Progressing as expected post-operatively. -XR: See above -WB Status: Nonweightbearing in right lower extremity in knee scooter -Sutures: Intact.  No signs of dehiscence noted no complication noted. -Medications: None -Foot redressed.  No follow-ups on file.

## 2023-08-04 ENCOUNTER — Encounter: Payer: No Typology Code available for payment source | Admitting: Podiatry

## 2023-08-06 DIAGNOSIS — F324 Major depressive disorder, single episode, in partial remission: Secondary | ICD-10-CM | POA: Diagnosis not present

## 2023-08-06 DIAGNOSIS — Z6841 Body Mass Index (BMI) 40.0 and over, adult: Secondary | ICD-10-CM | POA: Diagnosis not present

## 2023-08-06 DIAGNOSIS — R2681 Unsteadiness on feet: Secondary | ICD-10-CM | POA: Diagnosis not present

## 2023-08-06 DIAGNOSIS — Z008 Encounter for other general examination: Secondary | ICD-10-CM | POA: Diagnosis not present

## 2023-08-17 ENCOUNTER — Ambulatory Visit (INDEPENDENT_AMBULATORY_CARE_PROVIDER_SITE_OTHER): Payer: No Typology Code available for payment source | Admitting: Podiatry

## 2023-08-17 DIAGNOSIS — M9261 Juvenile osteochondrosis of tarsus, right ankle: Secondary | ICD-10-CM

## 2023-08-17 DIAGNOSIS — Z9889 Other specified postprocedural states: Secondary | ICD-10-CM

## 2023-08-17 DIAGNOSIS — M7661 Achilles tendinitis, right leg: Secondary | ICD-10-CM

## 2023-08-17 NOTE — Progress Notes (Signed)
  Subjective:  Patient ID: Maureen King, female    DOB: 05-21-1956,  MRN: [id number]  Chief Complaint  Patient presents with   Routine Post Op    DOS 07/28/23 --- RIGHT ACHILLES TENDONITIS REPAI WITH GASTROCNEMIUS RECESSION WITH HAGLUND RESECTION WITH FIXATION    DOS: 07/28/2023 Procedure: Right Achilles tendinitis with gastrocnemius recession with Haglund's resection  68 y.o. female returns for post-op check.  Patient states that she is doing well  Review of Systems: Negative except as noted in the HPI. Denies N/V/F/Ch.  Past Medical History:  Diagnosis Date   Anxiety    Depression    GERD (gastroesophageal reflux disease)    Insomnia    Obesity    Palpitations     Current Outpatient Medications:    aspirin EC 81 MG tablet, Take 81 mg by mouth daily. Swallow whole., Disp: , Rfl:    ciclopirox (PENLAC) 8 % solution, Apply topically at bedtime. Apply over nail and surrounding skin. Apply daily over previous coat. After seven (7) days, may remove with alcohol and continue cycle., Disp: 6.6 mL, Rfl: 12   clobetasol ointment (TEMOVATE) 0.05 %, Apply 1 Application topically 2 (two) times daily., Disp: 30 g, Rfl: 3   HOMEOPATHIC PRODUCTS PO, Take 1 capsule by mouth daily. doTerra On Guard+ Protective Essential Oil Blend, Disp: , Rfl:    ibuprofen (ADVIL) 800 MG tablet, Take 1 tablet (800 mg total) by mouth every 6 (six) hours as needed., Disp: 60 tablet, Rfl: 1   meloxicam (MOBIC) 15 MG tablet, Take 15 mg by mouth daily., Disp: , Rfl:    Multiple Vitamins-Minerals (CENTRUM SILVER WOMEN 50+ PO), Take 1 tablet by mouth., Disp: , Rfl:    OVER THE COUNTER MEDICATION, Take 1 tablet by mouth daily. doTerra Tumeric, Disp: , Rfl:    oxyCODONE-acetaminophen (PERCOCET) 5-325 MG tablet, Take 1 tablet by mouth every 4 (four) hours as needed for severe pain (pain score 7-10)., Disp: 30 tablet, Rfl: 0   PARoxetine (PAXIL-CR) 12.5 MG 24 hr tablet, Take 1 tablet (12.5 mg total) by mouth daily.,  Disp: 90 tablet, Rfl: 1   triamcinolone ointment (KENALOG) 0.5 %, Apply 1 Application topically 2 (two) times daily., Disp: 30 g, Rfl: 0  Social History   Tobacco Use  Smoking Status Never  Smokeless Tobacco Never    Allergies  Allergen Reactions   Wound Dressing Adhesive Rash   Penicillins Rash   Neosporin [Neomycin-Polymyxin-Gramicidin] Rash   Objective:  There were no vitals filed for this visit. There is no height or weight on file to calculate BMI. Constitutional Well developed. Well nourished.  Vascular Foot warm and well perfused. Capillary refill normal to all digits.   Neurologic Normal speech. Oriented to person, place, and time. Epicritic sensation to light touch grossly present bilaterally.  Dermatologic Skin completely epithelialized.  No signs of dehiscence noted no complication noted.  Orthopedic: Tenderness to palpation noted about the surgical site.   Radiographs: None Assessment:   1. Achilles tendinitis, right leg   2. Haglund's deformity, right   3. Status post foot surgery     Plan:  Patient was evaluated and treated and all questions answered.  S/p foot surgery right -Progressing as expected post-operatively. -XR: See above -WB Status: Weight-bear as tolerated to the right lower extremity -Sutures: Removed no signs of dehiscence noted no complication noted. -Medications: None -Foot redressed.  No follow-ups on file.

## 2023-08-18 ENCOUNTER — Encounter: Payer: No Typology Code available for payment source | Admitting: Podiatry

## 2023-08-19 NOTE — Telephone Encounter (Signed)
 Confirmation received that provider is still in network.  Patient aware.

## 2023-09-03 DIAGNOSIS — D2272 Melanocytic nevi of left lower limb, including hip: Secondary | ICD-10-CM | POA: Diagnosis not present

## 2023-09-03 DIAGNOSIS — D2271 Melanocytic nevi of right lower limb, including hip: Secondary | ICD-10-CM | POA: Diagnosis not present

## 2023-09-03 DIAGNOSIS — D225 Melanocytic nevi of trunk: Secondary | ICD-10-CM | POA: Diagnosis not present

## 2023-09-03 DIAGNOSIS — D2261 Melanocytic nevi of right upper limb, including shoulder: Secondary | ICD-10-CM | POA: Diagnosis not present

## 2023-09-03 DIAGNOSIS — D2262 Melanocytic nevi of left upper limb, including shoulder: Secondary | ICD-10-CM | POA: Diagnosis not present

## 2023-09-03 DIAGNOSIS — L821 Other seborrheic keratosis: Secondary | ICD-10-CM | POA: Diagnosis not present

## 2023-09-14 ENCOUNTER — Ambulatory Visit (INDEPENDENT_AMBULATORY_CARE_PROVIDER_SITE_OTHER): Admitting: Podiatry

## 2023-09-14 DIAGNOSIS — M9261 Juvenile osteochondrosis of tarsus, right ankle: Secondary | ICD-10-CM

## 2023-09-14 DIAGNOSIS — Z9889 Other specified postprocedural states: Secondary | ICD-10-CM

## 2023-09-14 DIAGNOSIS — M7661 Achilles tendinitis, right leg: Secondary | ICD-10-CM

## 2023-09-14 NOTE — Progress Notes (Signed)
 Subjective:  Patient ID: Maureen King, female    DOB: 07/17/1955,  MRN: [id number]  Chief Complaint  Patient presents with   Routine Post Op    DOS 07/28/23 --- RIGHT ACHILLES TENDONITIS REPAI WITH GASTROCNEMIUS RECESSION WITH HAGLUND RESECTION WITH FIXATION  Pt stated that she is doing okay she stated that she does have some tenderness and at times it feels weak     DOS: 07/28/2023 Procedure: Right Achilles tendinitis with gastrocnemius recession with Haglund's resection  68 y.o. female returns for post-op check.  Patient states that she is doing well.  She is doing well.  Pain is improving slowly.  Review of Systems: Negative except as noted in the HPI. Denies N/V/F/Ch.  Past Medical History:  Diagnosis Date   Anxiety    Depression    GERD (gastroesophageal reflux disease)    Insomnia    Obesity    Palpitations     Current Outpatient Medications:    aspirin EC 81 MG tablet, Take 81 mg by mouth daily. Swallow whole., Disp: , Rfl:    ciclopirox (PENLAC) 8 % solution, Apply topically at bedtime. Apply over nail and surrounding skin. Apply daily over previous coat. After seven (7) days, may remove with alcohol and continue cycle., Disp: 6.6 mL, Rfl: 12   clobetasol ointment (TEMOVATE) 0.05 %, Apply 1 Application topically 2 (two) times daily., Disp: 30 g, Rfl: 3   HOMEOPATHIC PRODUCTS PO, Take 1 capsule by mouth daily. doTerra On Guard+ Protective Essential Oil Blend, Disp: , Rfl:    ibuprofen (ADVIL) 800 MG tablet, Take 1 tablet (800 mg total) by mouth every 6 (six) hours as needed., Disp: 60 tablet, Rfl: 1   meloxicam (MOBIC) 15 MG tablet, Take 15 mg by mouth daily., Disp: , Rfl:    Multiple Vitamins-Minerals (CENTRUM SILVER WOMEN 50+ PO), Take 1 tablet by mouth., Disp: , Rfl:    OVER THE COUNTER MEDICATION, Take 1 tablet by mouth daily. doTerra Tumeric, Disp: , Rfl:    oxyCODONE-acetaminophen (PERCOCET) 5-325 MG tablet, Take 1 tablet by mouth every 4 (four) hours as needed  for severe pain (pain score 7-10)., Disp: 30 tablet, Rfl: 0   PARoxetine (PAXIL-CR) 12.5 MG 24 hr tablet, Take 1 tablet (12.5 mg total) by mouth daily., Disp: 90 tablet, Rfl: 1   triamcinolone ointment (KENALOG) 0.5 %, Apply 1 Application topically 2 (two) times daily., Disp: 30 g, Rfl: 0  Social History   Tobacco Use  Smoking Status Never  Smokeless Tobacco Never    Allergies  Allergen Reactions   Wound Dressing Adhesive Rash   Penicillins Rash   Neosporin [Neomycin-Polymyxin-Gramicidin] Rash   Objective:  There were no vitals filed for this visit. There is no height or weight on file to calculate BMI. Constitutional Well developed. Well nourished.  Vascular Foot warm and well perfused. Capillary refill normal to all digits.   Neurologic Normal speech. Oriented to person, place, and time. Epicritic sensation to light touch grossly present bilaterally.  Dermatologic Skin completely epithelialized.  No signs of dehiscence noted no complication noted.  Good range of motion noted at the ankle joint.  5 degrees past 90  Orthopedic: Mild tenderness to palpation noted about the surgical site.   Radiographs: None Assessment:   1. Achilles tendinitis, right leg   2. Haglund's deformity, right   3. Status post foot surgery      Plan:  Patient was evaluated and treated and all questions answered.  S/p foot surgery right - Clinically  healed and officially discharged from my care if any foot and ankle issues or in the future she will come back and see me.  Encouraged her to do more range of motion exercises she states understanding.   No follow-ups on file.

## 2023-09-16 ENCOUNTER — Encounter: Admitting: Podiatry

## 2023-11-02 ENCOUNTER — Ambulatory Visit (INDEPENDENT_AMBULATORY_CARE_PROVIDER_SITE_OTHER): Payer: Self-pay | Admitting: Family Medicine

## 2023-11-02 ENCOUNTER — Encounter: Payer: Self-pay | Admitting: Family Medicine

## 2023-11-02 VITALS — BP 115/70 | HR 55 | Ht 60.5 in | Wt 223.2 lb

## 2023-11-02 DIAGNOSIS — F3341 Major depressive disorder, recurrent, in partial remission: Secondary | ICD-10-CM

## 2023-11-02 DIAGNOSIS — R0683 Snoring: Secondary | ICD-10-CM

## 2023-11-02 DIAGNOSIS — N3944 Nocturnal enuresis: Secondary | ICD-10-CM | POA: Diagnosis not present

## 2023-11-02 MED ORDER — PAROXETINE HCL ER 12.5 MG PO TB24: 12.5000 mg | ORAL_TABLET | Freq: Every day | ORAL | 1 refills | Status: DC

## 2023-11-02 NOTE — Assessment & Plan Note (Signed)
 Will ger her in for pelvic PT. Call with any concerns.

## 2023-11-02 NOTE — Assessment & Plan Note (Signed)
 Under good control on current regimen. Continue current regimen. Continue to monitor. Call with any concerns. Refills given.

## 2023-11-02 NOTE — Progress Notes (Signed)
 BP 115/70 (BP Location: Left Arm, Patient Position: Sitting, Cuff Size: Large)   Pulse (!) 55   Ht 5' 0.5" (1.537 m)   Wt 223 lb 3.2 oz (101.2 kg)   LMP  (LMP Unknown)   SpO2 97%   BMI 42.87 kg/m    Subjective:    Patient ID: Maureen King, female    DOB: 1955/06/29, 68 y.o.   MRN: [id number]  HPI: Maureen King is a 68 y.o. female  Chief Complaint  Patient presents with   Depression   Obesity   DEPRESSION Mood status: stable Satisfied with current treatment?: yes Symptom severity: mild  Duration of current treatment : chronic Side effects: no Medication compliance: excellent compliance Previous psychiatric medications: paxil  Depressed mood: no Anxious mood: no Anhedonia: no Significant weight loss or gain: no Insomnia: yes  Fatigue: no Feelings of worthlessness or guilt: no Impaired concentration/indecisiveness: no Suicidal ideations: no Hopelessness: no Crying spells: no    11/02/2023   10:17 AM 05/04/2023    9:55 AM 02/23/2023    9:53 AM 08/28/2022    9:13 AM 08/14/2022    1:45 PM  Depression screen PHQ 2/9  Decreased Interest 1 0 0 2 0  Down, Depressed, Hopeless 1 0 0 2 0  PHQ - 2 Score 2 0 0 4 0  Altered sleeping 1 0 1 2 3   Tired, decreased energy 1 0 0 2 2  Change in appetite 1 0 0 2 3  Feeling bad or failure about yourself   0 0 2 0  Trouble concentrating  0 0 2 1  Moving slowly or fidgety/restless 0 0 0 1 0  Suicidal thoughts 0 0 0 0 0  PHQ-9 Score 5 0 1 15 9   Difficult doing work/chores Not difficult at all Not difficult at all Not difficult at all Somewhat difficult Very difficult   OBESITY Duration: chronic Previous attempts at weight loss: yes, portion control, weight watchers, exercise Complications of obesity: None Peak weight: 240 Weight loss goal: to be healthy Weight loss to date: 17lbs  Requesting obesity pharmacotherapy: unsure Current weight loss supplements/medications: no Previous weight loss supplements/meds:  no  ???SLEEP APNEA Sleep apnea status: suspected Duration: chronic Last sleep study: none Wakes feeling refreshed:  no Daytime hypersomnolence:  no Fatigue:  yes Insomnia:  no Good sleep hygiene:  yes Difficulty falling asleep:  no Difficulty staying asleep:  no Snoring bothers bed partner:  yes Observed apnea by bed partner: yes Obesity:  yes Hypertension: no  Pulmonary hypertension:  no Coronary artery disease:  no   Relevant past medical, surgical, family and social history reviewed and updated as indicated. Interim medical history since our last visit reviewed. Allergies and medications reviewed and updated.  Review of Systems  Constitutional: Negative.   Respiratory: Negative.    Cardiovascular: Negative.   Genitourinary:  Positive for enuresis. Negative for decreased urine volume, difficulty urinating, dyspareunia, dysuria, flank pain, frequency, genital sores, hematuria, menstrual problem, pelvic pain, urgency, vaginal bleeding, vaginal discharge and vaginal pain.  Musculoskeletal: Negative.   Skin: Negative.   Psychiatric/Behavioral: Negative.      Per HPI unless specifically indicated above     Objective:     BP 115/70 (BP Location: Left Arm, Patient Position: Sitting, Cuff Size: Large)   Pulse (!) 55   Ht 5' 0.5" (1.537 m)   Wt 223 lb 3.2 oz (101.2 kg)   LMP  (LMP Unknown)   SpO2 97%   BMI 42.87 kg/m  Wt Readings from Last 3 Encounters:  11/02/23 223 lb 3.2 oz (101.2 kg)  05/04/23 221 lb (100.2 kg)  04/14/23 219 lb 9.6 oz (99.6 kg)    Physical Exam Vitals and nursing note reviewed.  Constitutional:      General: She is not in acute distress.    Appearance: Normal appearance. She is obese. She is not ill-appearing, toxic-appearing or diaphoretic.  HENT:     Head: Normocephalic and atraumatic.     Right Ear: External ear normal.     Left Ear: External ear normal.     Nose: Nose normal.     Mouth/Throat:     Mouth: Mucous membranes are moist.      Pharynx: Oropharynx is clear.  Eyes:     General: No scleral icterus.       Right eye: No discharge.        Left eye: No discharge.     Extraocular Movements: Extraocular movements intact.     Conjunctiva/sclera: Conjunctivae normal.     Pupils: Pupils are equal, round, and reactive to light.  Cardiovascular:     Rate and Rhythm: Normal rate and regular rhythm.     Pulses: Normal pulses.     Heart sounds: Normal heart sounds. No murmur heard.    No friction rub. No gallop.  Pulmonary:     Effort: Pulmonary effort is normal. No respiratory distress.     Breath sounds: Normal breath sounds. No stridor. No wheezing, rhonchi or rales.  Chest:     Chest wall: No tenderness.  Musculoskeletal:        General: Normal range of motion.     Cervical back: Normal range of motion and neck supple.  Skin:    General: Skin is warm and dry.     Capillary Refill: Capillary refill takes less than 2 seconds.     Coloration: Skin is not jaundiced or pale.     Findings: No bruising, erythema, lesion or rash.  Neurological:     General: No focal deficit present.     Mental Status: She is alert and oriented to person, place, and time. Mental status is at baseline.  Psychiatric:        Mood and Affect: Mood normal.        Behavior: Behavior normal.        Thought Content: Thought content normal.        Judgment: Judgment normal.     Results for orders placed or performed in visit on 05/04/23  CBC with Differential/Platelet   Collection Time: 05/04/23  9:55 AM  Result Value Ref Range   WBC 5.9 3.4 - 10.8 x10E3/uL   RBC 4.49 3.77 - 5.28 x10E6/uL   Hemoglobin 13.2 11.1 - 15.9 g/dL   Hematocrit 81.1 91.4 - 46.6 %   MCV 94 79 - 97 fL   MCH 29.4 26.6 - 33.0 pg   MCHC 31.4 (L) 31.5 - 35.7 g/dL   RDW 78.2 95.6 - 21.3 %   Platelets 277 150 - 450 x10E3/uL   Neutrophils 75 Not Estab. %   Lymphs 14 Not Estab. %   Monocytes 6 Not Estab. %   Eos 4 Not Estab. %   Basos 1 Not Estab. %   Neutrophils  Absolute 4.4 1.4 - 7.0 x10E3/uL   Lymphocytes Absolute 0.9 0.7 - 3.1 x10E3/uL   Monocytes Absolute 0.4 0.1 - 0.9 x10E3/uL   EOS (ABSOLUTE) 0.3 0.0 - 0.4 x10E3/uL   Basophils Absolute 0.0 0.0 -  0.2 x10E3/uL   Immature Granulocytes 0 Not Estab. %   Immature Grans (Abs) 0.0 0.0 - 0.1 x10E3/uL  Comprehensive metabolic panel   Collection Time: 05/04/23  9:55 AM  Result Value Ref Range   Glucose 88 70 - 99 mg/dL   BUN 17 8 - 27 mg/dL   Creatinine, Ser 2.84 0.57 - 1.00 mg/dL   eGFR 97 >13 KG/MWN/0.27   BUN/Creatinine Ratio 26 12 - 28   Sodium 140 134 - 144 mmol/L   Potassium 4.3 3.5 - 5.2 mmol/L   Chloride 104 96 - 106 mmol/L   CO2 26 20 - 29 mmol/L   Calcium 9.4 8.7 - 10.3 mg/dL   Total Protein 6.0 6.0 - 8.5 g/dL   Albumin 3.9 3.9 - 4.9 g/dL   Globulin, Total 2.1 1.5 - 4.5 g/dL   Bilirubin Total 0.3 0.0 - 1.2 mg/dL   Alkaline Phosphatase 111 44 - 121 IU/L   AST 21 0 - 40 IU/L   ALT 21 0 - 32 IU/L  Lipid Panel w/o Chol/HDL Ratio   Collection Time: 05/04/23  9:55 AM  Result Value Ref Range   Cholesterol, Total 226 (H) 100 - 199 mg/dL   Triglycerides 253 (H) 0 - 149 mg/dL   HDL 77 >66 mg/dL   VLDL Cholesterol Cal 32 5 - 40 mg/dL   LDL Chol Calc (NIH) 440 (H) 0 - 99 mg/dL  TSH   Collection Time: 05/04/23  9:55 AM  Result Value Ref Range   TSH 1.330 0.450 - 4.500 uIU/mL      Assessment & Plan:   Problem List Items Addressed This Visit       Other   Morbid obesity (HCC)   Will work on diet and exercise and recheck in 3 months. Call with any concerns.       Depression - Primary   Under good control on current regimen. Continue current regimen. Continue to monitor. Call with any concerns. Refills given.        Relevant Medications   PARoxetine  (PAXIL -CR) 12.5 MG 24 hr tablet   Nocturnal enuresis   Will ger her in for pelvic PT. Call with any concerns.       Relevant Orders   Ambulatory referral to Physical Therapy   Other Visit Diagnoses       Snoring       Will  get her sleep study. Call with any concerns. Continue to monitor.   Relevant Orders   Ambulatory referral to Sleep Studies        Follow up plan: Return in about 3 months (around 02/02/2024).

## 2023-11-02 NOTE — Assessment & Plan Note (Signed)
 Will work on diet and exercise and recheck in 3 months. Call with any concerns.

## 2023-11-10 DIAGNOSIS — H1045 Other chronic allergic conjunctivitis: Secondary | ICD-10-CM | POA: Diagnosis not present

## 2023-11-10 DIAGNOSIS — H52223 Regular astigmatism, bilateral: Secondary | ICD-10-CM | POA: Diagnosis not present

## 2023-11-10 DIAGNOSIS — Z135 Encounter for screening for eye and ear disorders: Secondary | ICD-10-CM | POA: Diagnosis not present

## 2023-11-10 DIAGNOSIS — H25013 Cortical age-related cataract, bilateral: Secondary | ICD-10-CM | POA: Diagnosis not present

## 2023-11-10 DIAGNOSIS — H5213 Myopia, bilateral: Secondary | ICD-10-CM | POA: Diagnosis not present

## 2023-11-10 DIAGNOSIS — H2513 Age-related nuclear cataract, bilateral: Secondary | ICD-10-CM | POA: Diagnosis not present

## 2023-11-10 DIAGNOSIS — D3132 Benign neoplasm of left choroid: Secondary | ICD-10-CM | POA: Diagnosis not present

## 2023-11-10 DIAGNOSIS — H524 Presbyopia: Secondary | ICD-10-CM | POA: Diagnosis not present

## 2023-12-15 ENCOUNTER — Encounter: Payer: Self-pay | Admitting: Neurology

## 2023-12-15 ENCOUNTER — Ambulatory Visit (INDEPENDENT_AMBULATORY_CARE_PROVIDER_SITE_OTHER): Admitting: Neurology

## 2023-12-15 VITALS — BP 137/70 | HR 59 | Ht 60.5 in | Wt 221.0 lb

## 2023-12-15 DIAGNOSIS — R351 Nocturia: Secondary | ICD-10-CM

## 2023-12-15 DIAGNOSIS — R635 Abnormal weight gain: Secondary | ICD-10-CM

## 2023-12-15 DIAGNOSIS — R519 Headache, unspecified: Secondary | ICD-10-CM | POA: Diagnosis not present

## 2023-12-15 DIAGNOSIS — Z9189 Other specified personal risk factors, not elsewhere classified: Secondary | ICD-10-CM

## 2023-12-15 DIAGNOSIS — R0683 Snoring: Secondary | ICD-10-CM | POA: Diagnosis not present

## 2023-12-15 NOTE — Patient Instructions (Signed)

## 2023-12-15 NOTE — Progress Notes (Signed)
 Subjective:    Patient ID: Maureen King is a 68 y.o. female.  HPI    True Mar, MD, PhD [facility] [address] [address] [place], [place] [address]  Dear Dr. Vicci,  I saw your patient, Maureen King, upon your kind request in my sleep clinic today for initial consultation of her sleep disorder, in particular, concern for underlying obstructive sleep apnea.  The patient is unaccompanied today.  As you know, [name] is a 68 year old female with an underlying medical history of reflux disease, palpitations, insomnia, anxiety, depression, and severe obesity with a BMI of over 40, who reports snoring and excessive daytime somnolence.  Her Epworth sleepiness score is 5 out of 24, fatigue severity score is 14 out of 63.  I reviewed your office note from 11/02/2023.  She has occasionally woken up with palpitations.  Years ago she woke up once with a sense of gasping for air.  She is not aware of any family history of sleep apnea.  She lives with her husband.  She has 1 grown stepson.  They have a small dog in the household and the dog typically sleeps on the bed with them.  She does have a TV in her bedroom.  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  She is a non-smoker and does not drink any alcohol, limits her caffeine to 1 cup of coffee per day, 12 ounce size.  She has had some difficulty losing weight.  She has nocturia about once per average night and has had rare morning headaches, used to have migraines in the past.  Bedtime is generally between 9 and 10 and rise time around 6.  Her husband still works.   Her Past Medical History Is Significant For: Past Medical History:  Diagnosis Date   Anxiety    Depression    GERD (gastroesophageal reflux disease)    Insomnia    Obesity    Palpitations     Her Past Surgical History Is Significant For: Past Surgical History:  Procedure Laterality Date   COLONOSCOPY     COLPOSCOPY N/A  05/30/2020   Procedure: COLPOSCOPY;  Surgeon: Victor Claudell SAUNDERS, MD;  Location: [facility];  Service: Gynecology;  Laterality: N/A;   CYSTOSCOPY N/A 08/29/2020   Procedure: CYSTOSCOPY;  Surgeon: Victor Claudell SAUNDERS, MD;  Location: [facility];  Service: Gynecology;  Laterality: N/A;   HYSTEROSCOPY WITH D & C N/A 05/30/2020   Procedure: DILATATION AND CURETTAGE /HYSTEROSCOPY;  Surgeon: Victor Claudell SAUNDERS, MD;  Location: [facility];  Service: Gynecology;  Laterality: N/A;   NO PAST SURGERIES     ROBOTIC ASSISTED LAPAROSCOPIC HYSTERECTOMY AND SALPINGECTOMY Bilateral 08/29/2020   Procedure: XI ROBOTIC ASSISTED LAPAROSCOPIC HYSTERECTOMY AND SALPINGECTOMY, BILATERAL OOPHORECTOMY;  Surgeon: Victor Claudell SAUNDERS, MD;  Location: [facility];  Service: Gynecology;  Laterality: Bilateral;    Her Family History Is Significant For: Family History  Problem Relation Age of Onset   Hypertension Mother    Osteoporosis Mother    Heart disease Father 68       Stent   Lung disease Father    Idiopathic pulmonary fibrosis Father    Hypertension Brother    Cancer Maternal Grandmother        gallbladder   Heart disease Paternal Grandfather        MI   Breast cancer Neg Hx     Her Social History Is Significant For: Social History   Socioeconomic History   Marital  status: Married    Spouse name: Not on file   Number of children: Not on file   Years of education: Not on file   Highest education level: Some college, no degree  Occupational History   Not on file  Tobacco Use   Smoking status: Never   Smokeless tobacco: Never  Vaping Use   Vaping status: Never Used  Substance and Sexual Activity   Alcohol use: Yes    Alcohol/week: 0.0 standard drinks of alcohol    Comment: on occasion (4x/year)   Drug use: No   Sexual activity: Not Currently  Other Topics Concern   Not on file  Social History Narrative   Not on file   Social Drivers of Health   Financial Resource Strain: Low Risk   (02/23/2023)   Overall Financial Resource Strain (CARDIA)    Difficulty of Paying Living Expenses: Not hard at all  Food Insecurity: No Food Insecurity (02/23/2023)   Hunger Vital Sign    Worried About Running Out of Food in the Last Year: Never true    Ran Out of Food in the Last Year: Never true  Transportation Needs: No Transportation Needs (02/23/2023)   PRAPARE - Administrator, Civil Service (Medical): No    Lack of Transportation (Non-Medical): No  Physical Activity: Unknown (02/23/2023)   Exercise Vital Sign    Days of Exercise per Week: 0 days    Minutes of Exercise per Session: Not on file  Stress: No Stress Concern Present (02/23/2023)   [employer] of Occupational Health - Occupational Stress Questionnaire    Feeling of Stress : Only a little  Social Connections: Socially Integrated (02/23/2023)   Social Connection and Isolation Panel    Frequency of Communication with Friends and Family: More than three times a week    Frequency of Social Gatherings with Friends and Family: Once a week    Attends Religious Services: More than 4 times per year    Active Member of [employer] or Organizations: Yes    Attends Engineer, structural: More than 4 times per year    Marital Status: Married    Her Allergies Are:  Allergies  Allergen Reactions   Wound Dressing Adhesive Rash   Penicillins Rash   Neosporin [Neomycin-Polymyxin-Gramicidin] Rash  :   Her Current Medications Are:  Outpatient Encounter Medications as of 12/15/2023  Medication Sig   aspirin EC 81 MG tablet Take 81 mg by mouth daily. Swallow whole.   Multiple Vitamins-Minerals (CENTRUM SILVER  WOMEN 50+ PO) Take 1 tablet by mouth.   PARoxetine  (PAXIL -CR) 12.5 MG 24 hr tablet Take 1 tablet (12.5 mg total) by mouth daily.   No facility-administered encounter medications on file as of 12/15/2023.  :   Review of Systems:  Out of a complete 14 point review of systems, all are reviewed and negative with  the exception of these symptoms as listed below:  Review of Systems  Neurological:        Snoring, OSA eval, pt hasn't had a sleep study and never used  c-pap she was referred by PCP and she notice a change weight , she stated that husband notice that she has been snoring   EES total is 5    Objective:  Neurological Exam  Physical Exam Physical Examination:   Vitals:   12/15/23 1020  BP: 137/70  Pulse: (!) 59    General Examination: The patient is a very pleasant 68 y.o. female in no acute distress.  She appears well-developed and well-nourished and well groomed.   HEENT: Normocephalic, atraumatic, pupils are equal, round and reactive to light, extraocular tracking is good without limitation to gaze excursion or nystagmus noted. Hearing is grossly intact. Face is symmetric with normal facial animation. Speech is clear with no dysarthria noted. There is no hypophonia. There is no lip, neck/head, jaw or voice tremor. Neck is supple with full range of passive and active motion. There are no carotid bruits on auscultation. Oropharynx exam reveals: No significant mouth dryness, good dental hygiene, moderate airway crowding secondary to very small airway opening, small mouth opening noted as well, Mallampati class IV, tip of uvula and tonsils not fully visualized.  Neck circumference 14-1/4 inches, mild to moderate overbite noted.   Chest: Clear to auscultation without wheezing, rhonchi or crackles noted.  Heart: S1+S2+0, regular and normal without murmurs, rubs or gallops noted.   Abdomen: Soft, non-tender and non-distended.  Extremities: There is no pitting edema in the distal lower extremities bilaterally.   Skin: Warm and dry without trophic changes noted.   Musculoskeletal: exam reveals right ankle in a compression sock, had surgery to a heel spur in February 2025.   Neurologically:  Mental status: The patient is awake, alert and oriented in all 4 spheres. Her immediate and remote  memory, attention, language skills and fund of knowledge are appropriate. There is no evidence of aphasia, agnosia, apraxia or anomia. Speech is clear with normal prosody and enunciation. Thought process is linear. Mood is normal and affect is normal.  Cranial nerves II - XII are as described above under HEENT exam.  Motor exam: Normal bulk, strength and tone is noted. There is no obvious action or resting tremor.  Fine motor skills and coordination: grossly intact.  Cerebellar testing: No dysmetria or intention tremor. There is no truncal or gait ataxia.  Sensory exam: intact to light touch in the upper and lower extremities.  Gait, station and balance: She stands easily. No veering to one side is noted. No leaning to one side is noted. Posture is age-appropriate and stance is narrow based. Gait shows normal stride length and normal pace. No problems turning are noted.   Assessment and Plan:  In summary, Maureen King is a very pleasant 68 y.o.-year old female with an underlying medical history of reflux disease, palpitations, insomnia, anxiety, depression, and severe obesity with a BMI of over 40, whose history and physical exam are concerning for sleep disordered breathing, particularly obstructive sleep apnea (OSA). A laboratory attended sleep study is typically considered gold standard for evaluation of sleep disordered breathing.   I had a long chat with the patient about my findings and the diagnosis of sleep apnea, particularly OSA, its prognosis and treatment options. We talked about medical/conservative treatments, surgical interventions and non-pharmacological approaches for symptom control. I explained, in particular, the risks and ramifications of untreated moderate to severe OSA, especially with respect to developing cardiovascular disease down the road, including congestive heart failure (CHF), difficult to treat hypertension, cardiac arrhythmias (particularly A-fib), neurovascular  complications including TIA, stroke and dementia. Even type 2 diabetes has, in part, been linked to untreated OSA. Symptoms of untreated OSA may include (but may not be limited to) daytime sleepiness, nocturia (i.e. frequent nighttime urination), memory problems, mood irritability and suboptimally controlled or worsening mood disorder such as depression and/or anxiety, lack of energy, lack of motivation, physical discomfort, as well as recurrent headaches, especially morning or nocturnal headaches. We talked about the importance of  maintaining a healthy lifestyle and striving for healthy weight. In addition, we talked about the importance of striving for and maintaining good sleep hygiene. I recommended a sleep study at this time. I outlined the differences between a laboratory attended sleep study which is considered more comprehensive and accurate over the option of a home sleep test (HST); the latter may lead to underestimation of sleep disordered breathing in some instances and does not help with diagnosing upper airway resistance syndrome and is not accurate enough to diagnose primary central sleep apnea typically. I outlined possible surgical and non-surgical treatment options of OSA, including the use of a positive airway pressure (PAP) device (i.e. CPAP, AutoPAP/APAP or BiPAP in certain circumstances), a custom-made dental device (aka oral appliance, which would require a referral to a specialist dentist or orthodontist typically, and is generally speaking not considered for patients with full dentures or edentulous state), upper airway surgical options, such as traditional UPPP (which is not considered a first-line treatment) or the Inspire device (hypoglossal nerve stimulator, which would involve a referral for consultation with an ENT surgeon, after careful selection, following inclusion criteria - also not first-line treatment). I explained the PAP treatment option to the patient in detail, as this is  generally considered first-line treatment.  The patient indicated that she would be willing to try PAP therapy, if the need arises. I explained the importance of being compliant with PAP treatment, not only for insurance purposes but primarily to improve patient's symptoms symptoms, and for the patient's long term health benefit, including to reduce Her cardiovascular risks longer-term.    We will pick up our discussion about the next steps and treatment options after testing.  We will keep her posted as to the test results by phone call and/or MyChart messaging where possible.  We will plan to follow-up in sleep clinic accordingly as well.  I answered all her questions today and the patient was in agreement.   I encouraged her to call with any interim questions, concerns, problems or updates or email us  through MyChart.  Generally speaking, sleep test authorizations may take up to 2 weeks, sometimes less, sometimes longer, the patient is encouraged to get in touch with us  if they do not hear back from the sleep lab staff directly within the next 2 weeks.  Thank you very much for allowing me to participate in the care of this nice patient. If I can be of any further assistance to you please do not hesitate to call me at [phone].  Sincerely,   True Mar, MD, PhD

## 2023-12-21 ENCOUNTER — Telehealth: Payer: Self-pay | Admitting: Neurology

## 2023-12-21 NOTE — Telephone Encounter (Signed)
 [employer] Devoted health pending

## 2023-12-21 NOTE — Telephone Encounter (Signed)
 [employer] devoted auth: [id number] (exp. 12/21/23 to 03/22/24)

## 2023-12-28 NOTE — Telephone Encounter (Signed)
 [employer] auth: [id number] (exp. 12/21/23 to 03/22/24)   Patient is scheduled at [facility] For 01/23/2024 at 9 pm.  Mailed packet and sent mychart.

## 2024-01-23 ENCOUNTER — Ambulatory Visit (INDEPENDENT_AMBULATORY_CARE_PROVIDER_SITE_OTHER): Admitting: Neurology

## 2024-01-23 DIAGNOSIS — Z9189 Other specified personal risk factors, not elsewhere classified: Secondary | ICD-10-CM

## 2024-01-23 DIAGNOSIS — R0683 Snoring: Secondary | ICD-10-CM | POA: Diagnosis not present

## 2024-01-23 DIAGNOSIS — R519 Headache, unspecified: Secondary | ICD-10-CM

## 2024-01-23 DIAGNOSIS — R351 Nocturia: Secondary | ICD-10-CM

## 2024-01-23 DIAGNOSIS — G4720 Circadian rhythm sleep disorder, unspecified type: Secondary | ICD-10-CM

## 2024-01-23 DIAGNOSIS — R635 Abnormal weight gain: Secondary | ICD-10-CM

## 2024-01-23 DIAGNOSIS — G4733 Obstructive sleep apnea (adult) (pediatric): Secondary | ICD-10-CM

## 2024-01-23 IMAGING — DX DG HIP (WITH OR WITHOUT PELVIS) 2-3V*R*
3 series · 3 of 3 positions shown · non-contrast
Comparison: None.

CLINICAL DATA: Bilateral hip pain.

EXAM:
DG HIP (WITH OR WITHOUT PELVIS) 2-3V RIGHT; DG HIP (WITH OR WITHOUT
PELVIS) 2-3V LEFT

[pelvis ap]
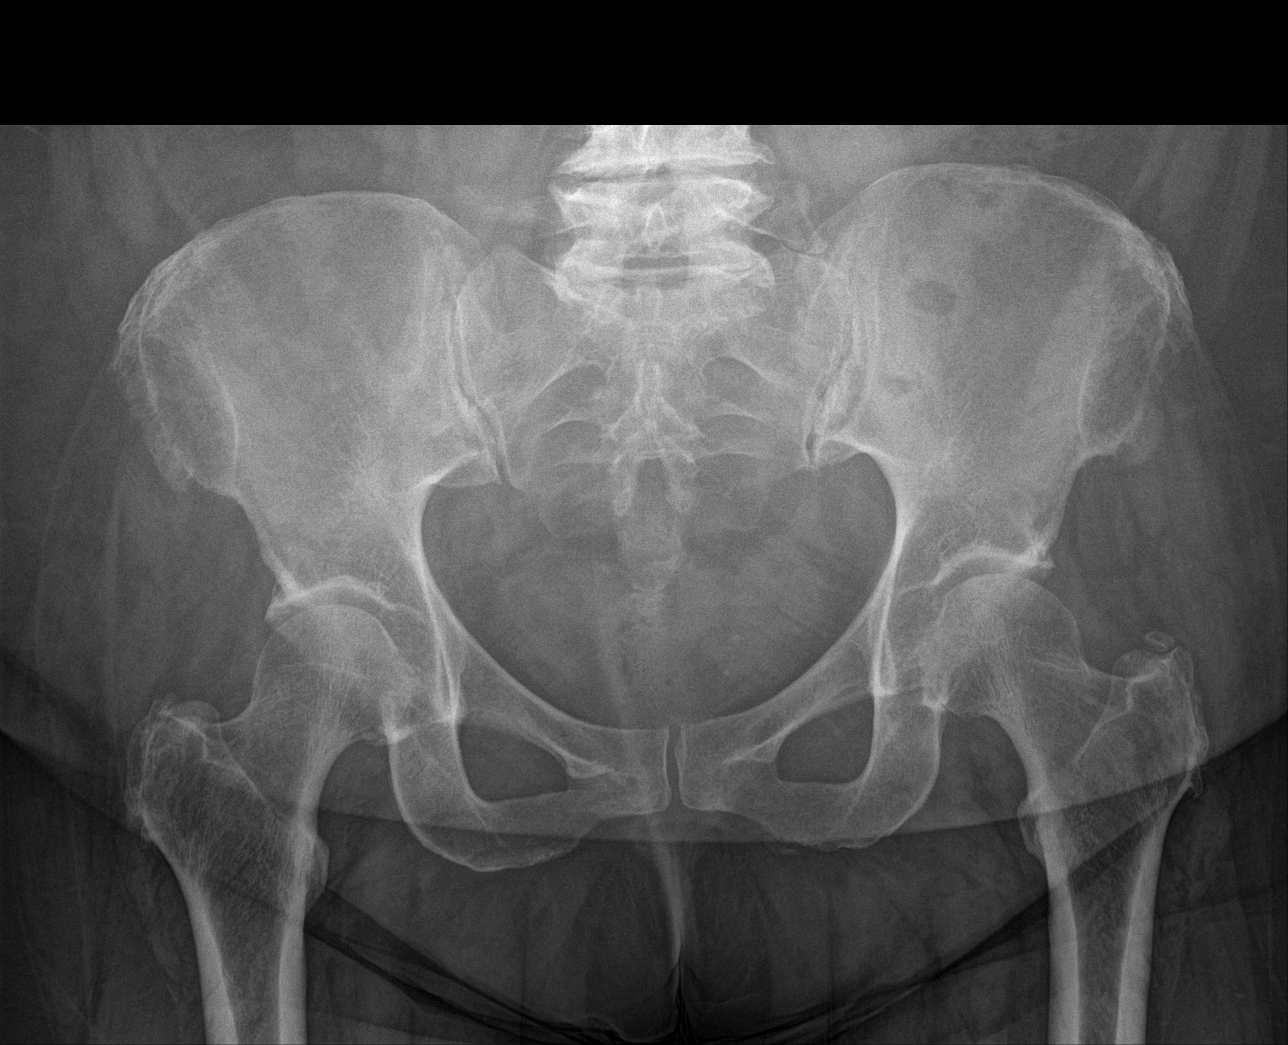

[hip ap]
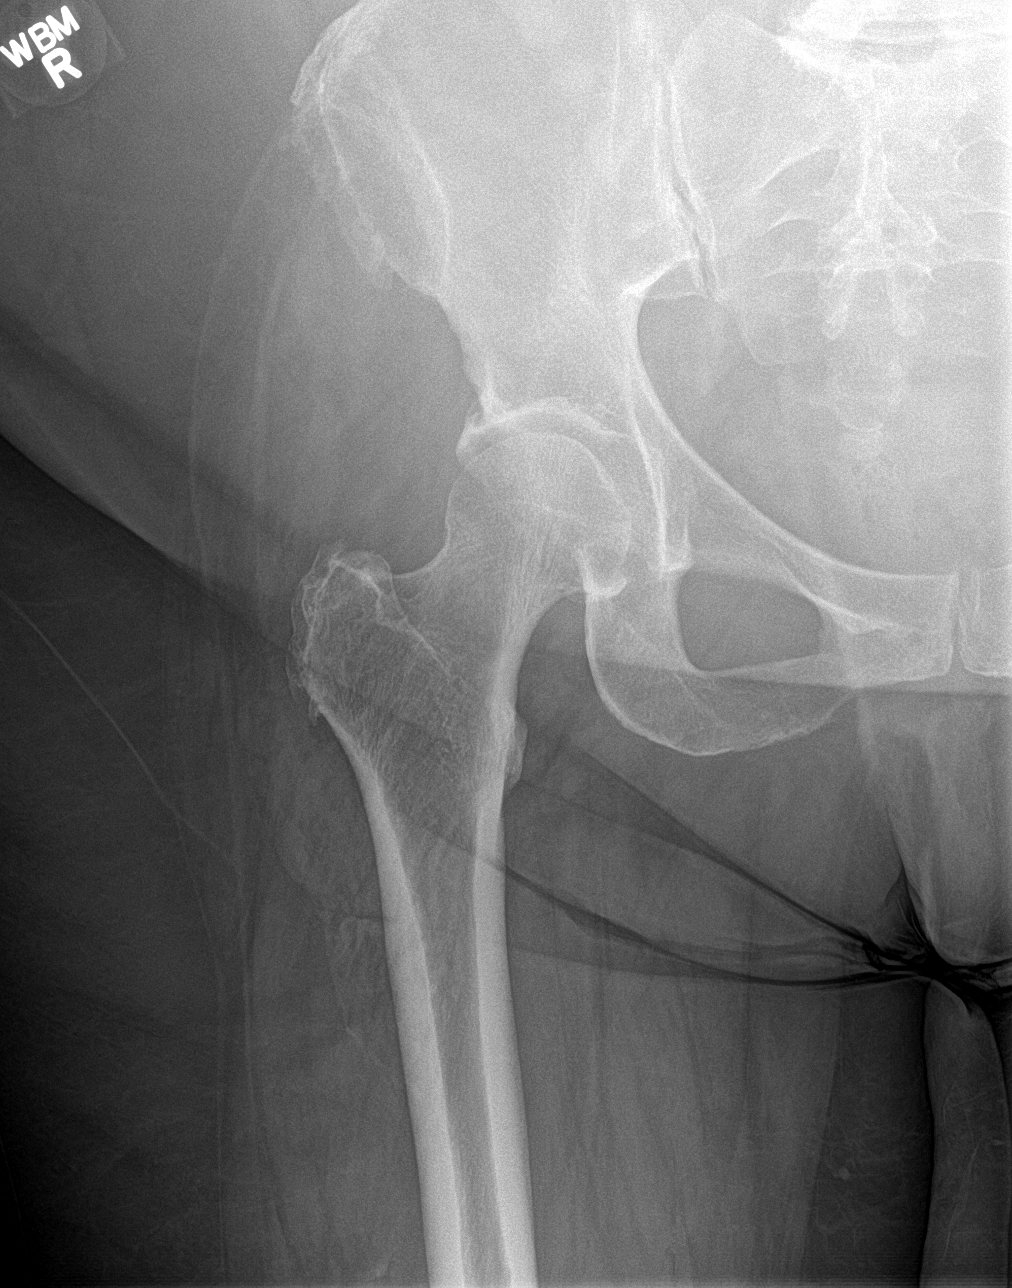

[hip lat]
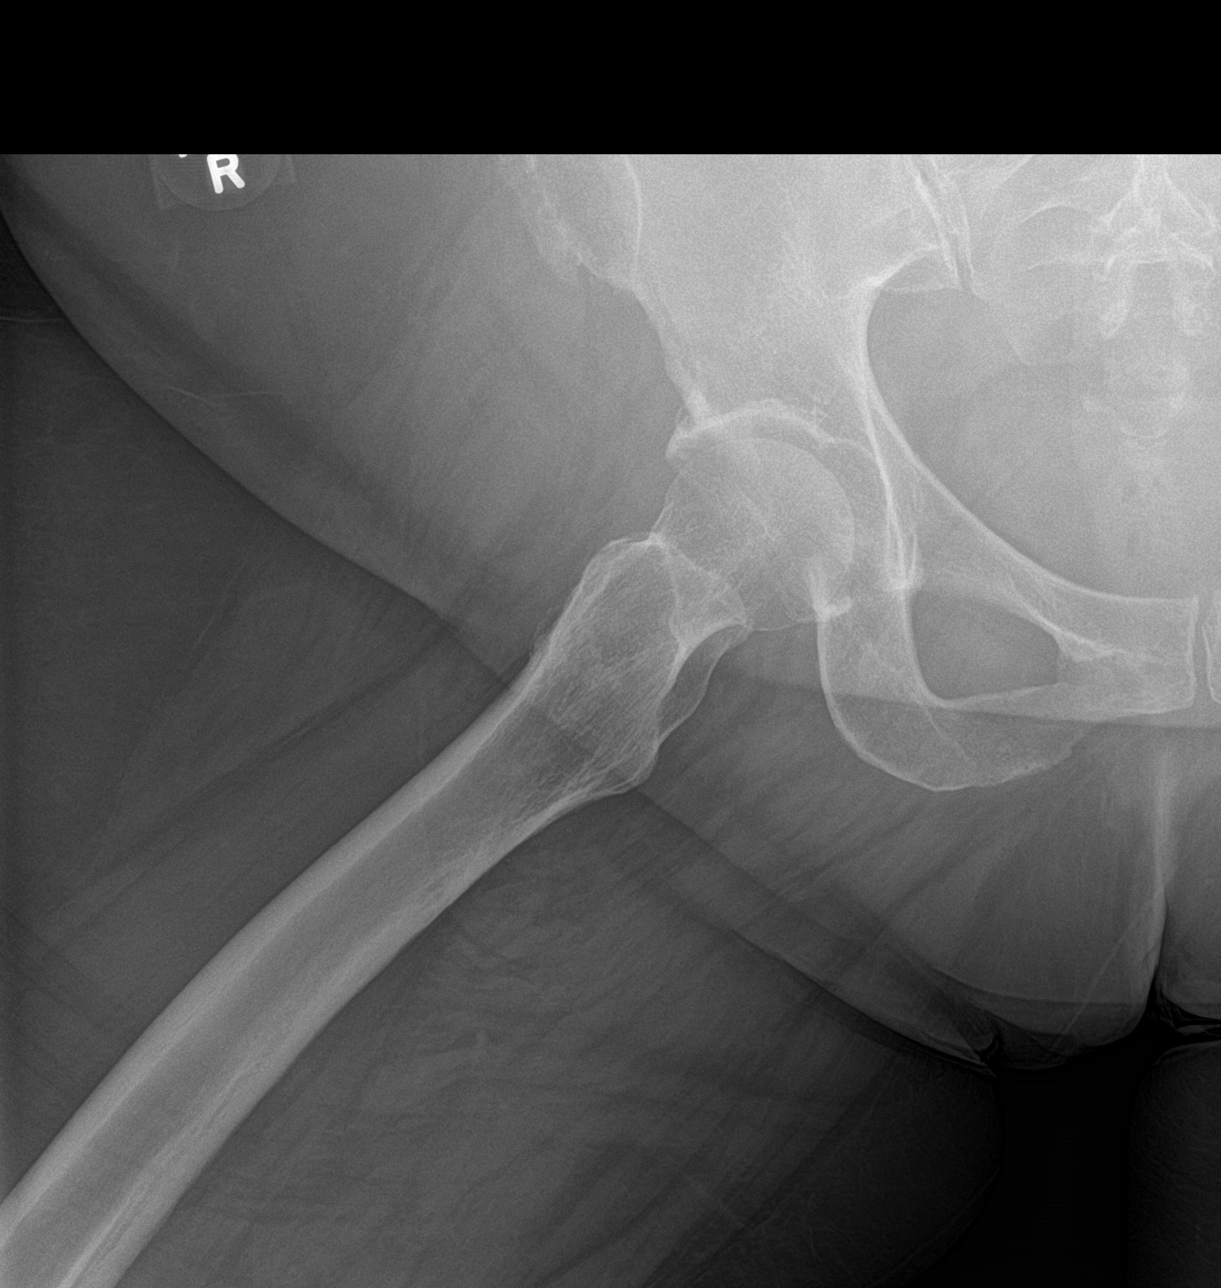

[3 of 3 positions shown; findings below may reference images not displayed]

FINDINGS: There is diffuse decreased bone mineralization. There is a left
L5-S1 transitional assimilation joint. This can be a source of lower
back and/or hip pain. There is a complete bridging osteophyte at the
left L3-4 disc level. Moderate right and mild left L4-5 disc space
narrowing.

Mild bilateral femoroacetabular joint space narrowing and
superolateral acetabular degenerative osteophytosis.

Mild bilateral sacroiliac subchondral sclerosis degenerative change.

No acute fracture or dislocation.
IMPRESSION: :
IMPRESSION: 1. Left L5-S1 transitional assimilation joint. This can be a source
of lower back and hip pain.
2. Mild bilateral femoroacetabular osteoarthritis.
3. Mild bilateral sacroiliac joint osteoarthritis.

## 2024-01-23 IMAGING — DX DG HIP (WITH OR WITHOUT PELVIS) 2-3V*L*
2 series · 2 of 2 positions shown · non-contrast
Comparison: None.

CLINICAL DATA: Bilateral hip pain.

EXAM:
DG HIP (WITH OR WITHOUT PELVIS) 2-3V RIGHT; DG HIP (WITH OR WITHOUT
PELVIS) 2-3V LEFT

[hip ap]
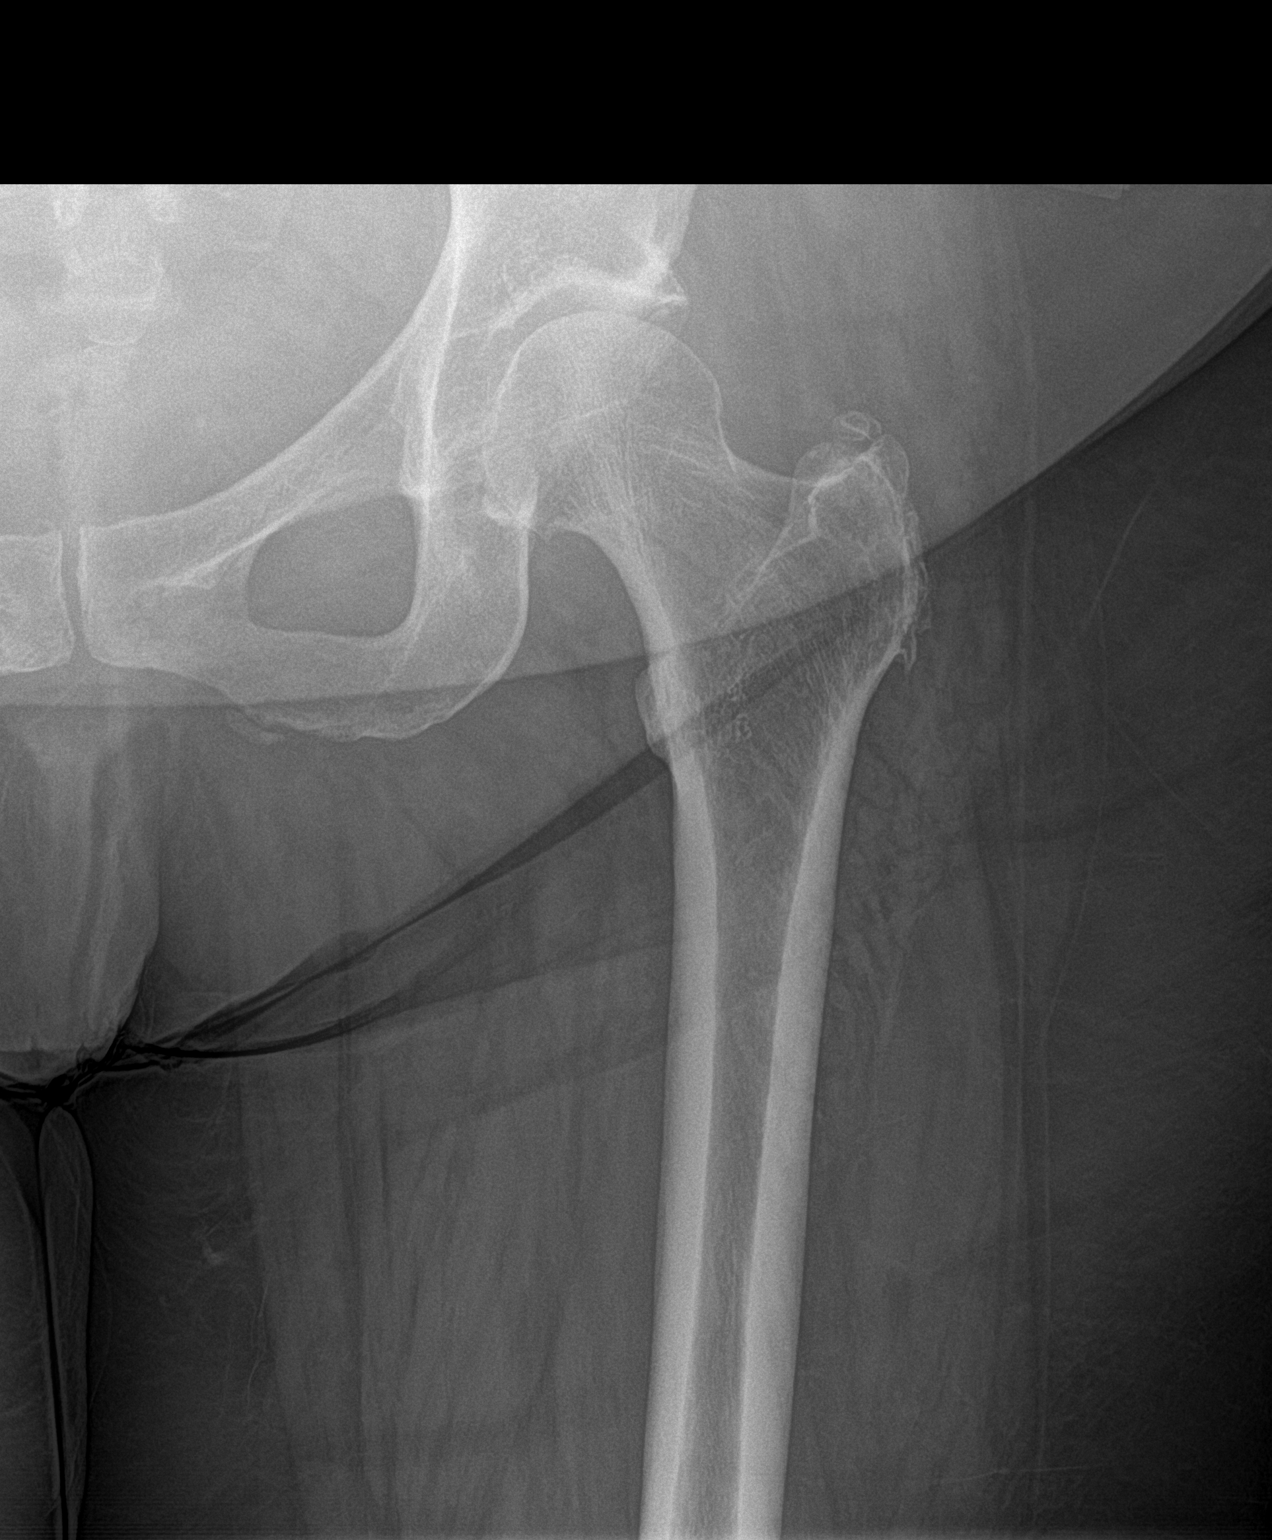

[hip lat]
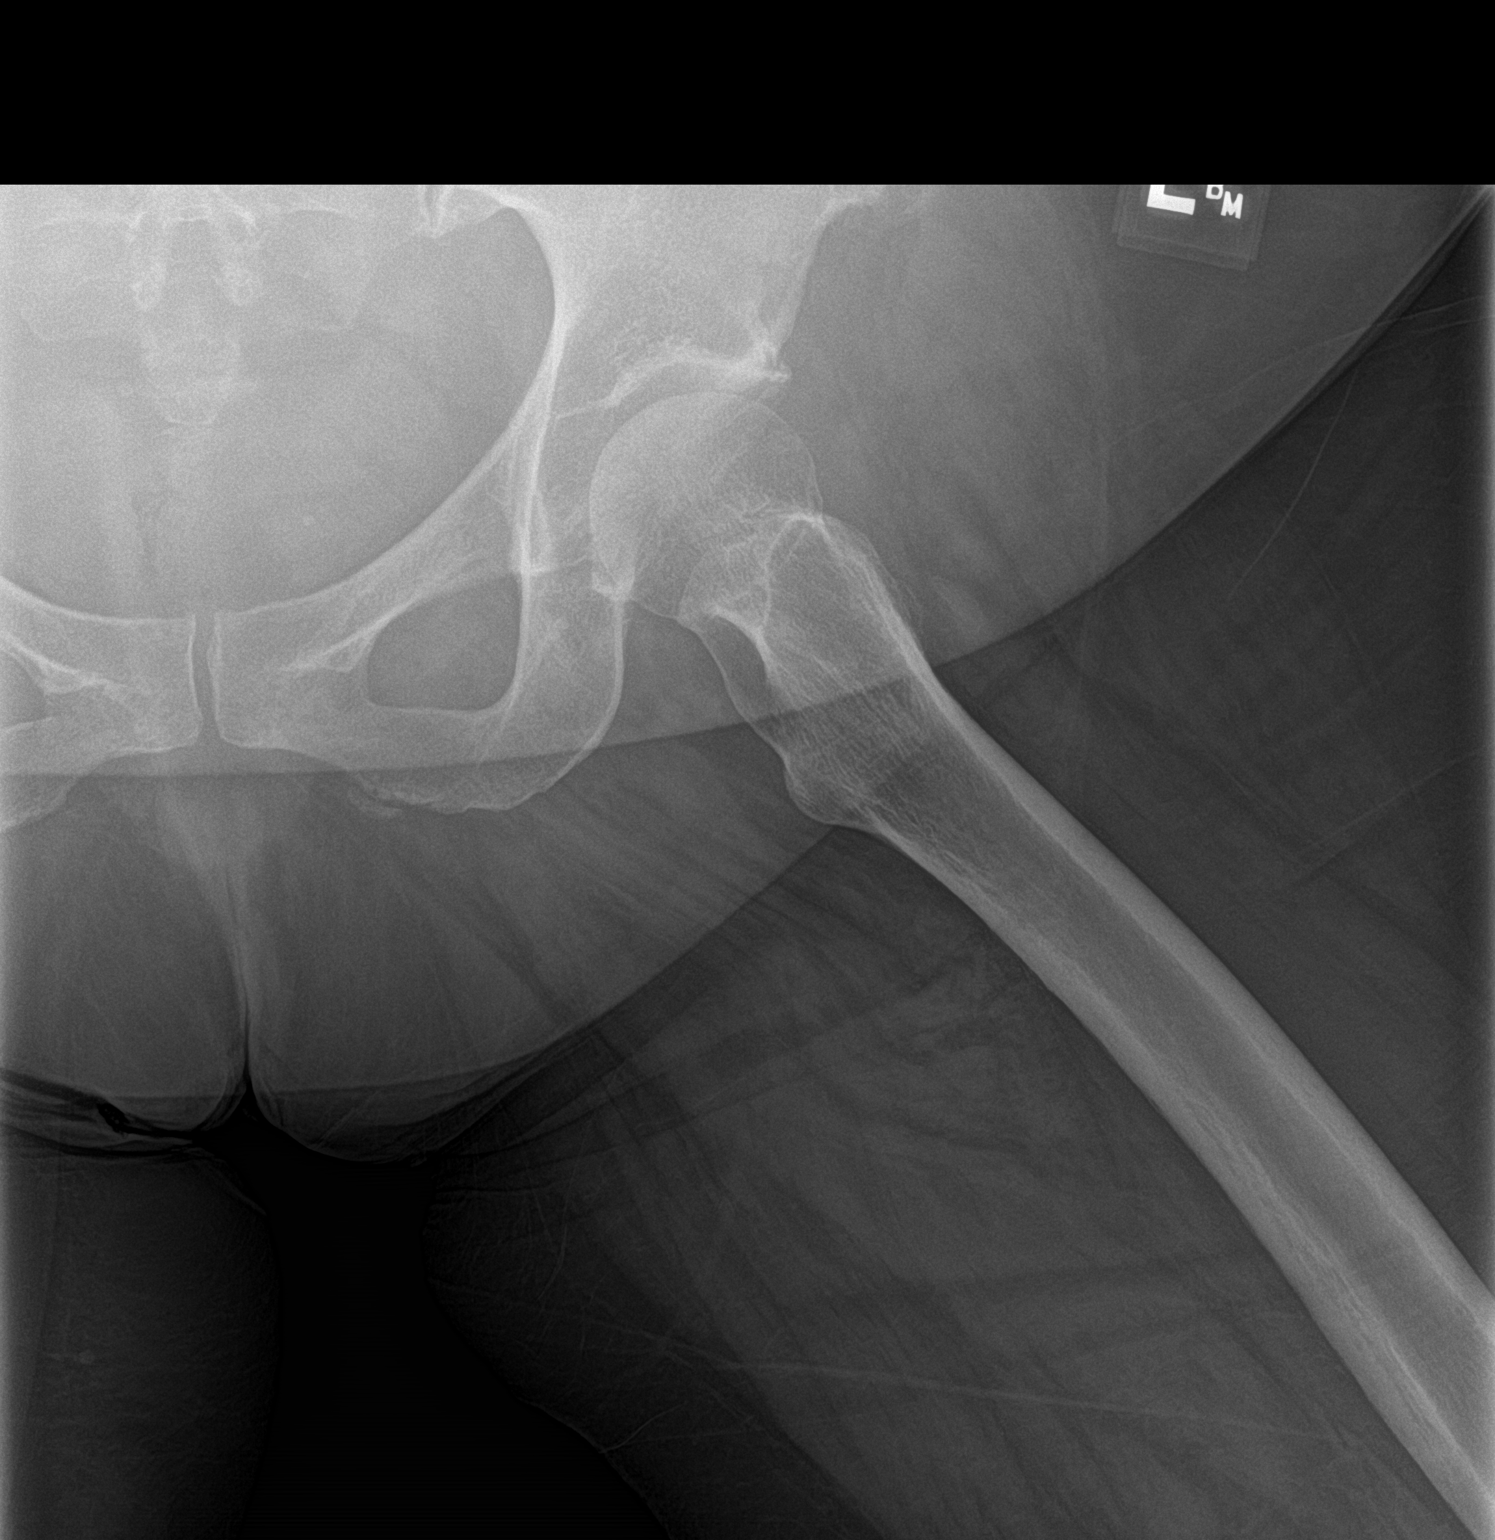

[2 of 2 positions shown; findings below may reference images not displayed]

FINDINGS: There is diffuse decreased bone mineralization. There is a left
L5-S1 transitional assimilation joint. This can be a source of lower
back and/or hip pain. There is a complete bridging osteophyte at the
left L3-4 disc level. Moderate right and mild left L4-5 disc space
narrowing.

Mild bilateral femoroacetabular joint space narrowing and
superolateral acetabular degenerative osteophytosis.

Mild bilateral sacroiliac subchondral sclerosis degenerative change.

No acute fracture or dislocation.
IMPRESSION: :
IMPRESSION: 1. Left L5-S1 transitional assimilation joint. This can be a source
of lower back and hip pain.
2. Mild bilateral femoroacetabular osteoarthritis.
3. Mild bilateral sacroiliac joint osteoarthritis.

## 2024-01-23 IMAGING — DX DG LUMBAR SPINE COMPLETE 4+V
5 series · 5 of 5 positions shown · non-contrast
Comparison: 06/23/2010

CLINICAL DATA: Back pain

EXAM:
LUMBAR SPINE - COMPLETE 4+ VIEW

[l-spine ap]
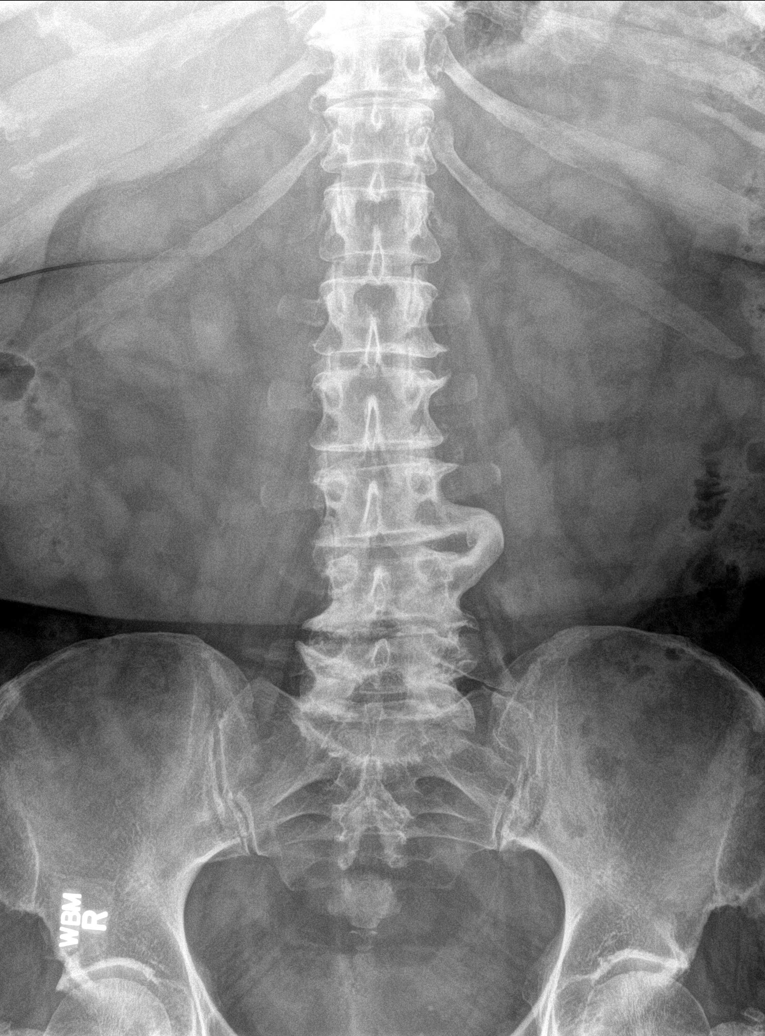

[l-spine obl (1 of 2)]
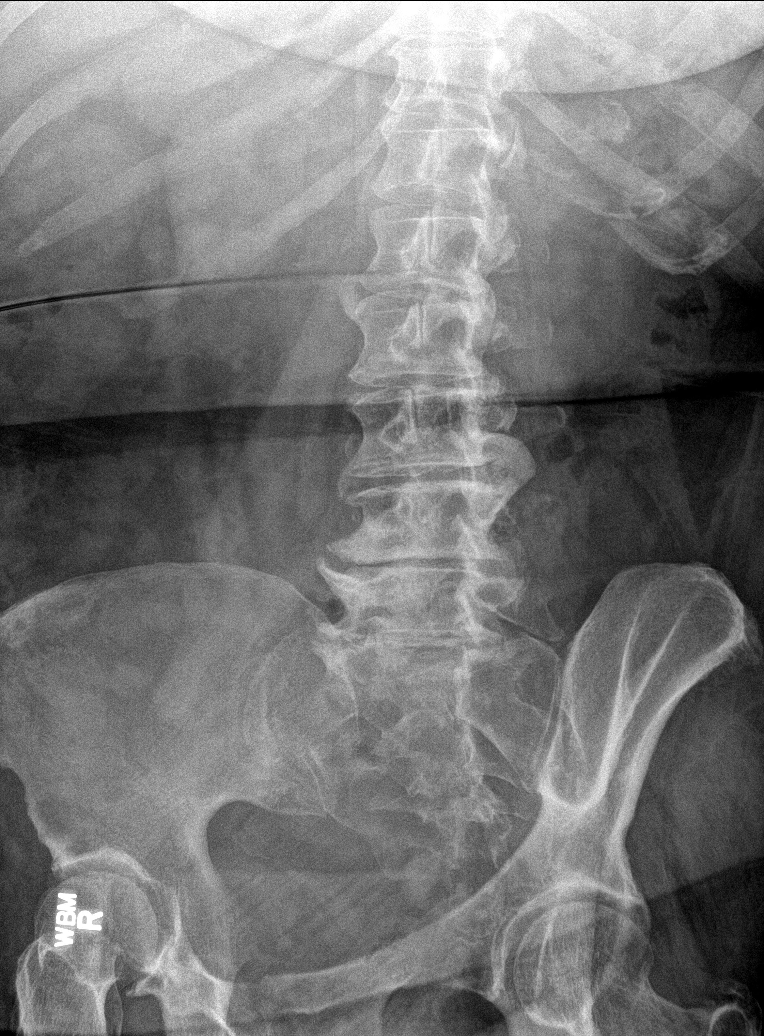

[l-spine obl (2 of 2)]
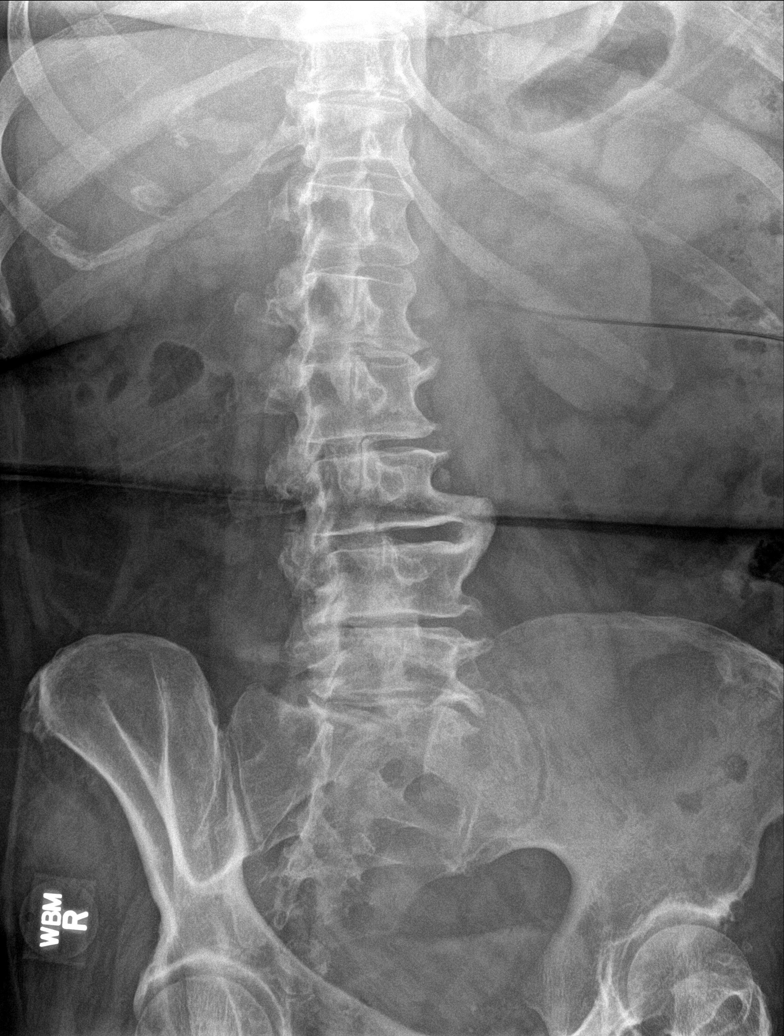

[l-spine spot]
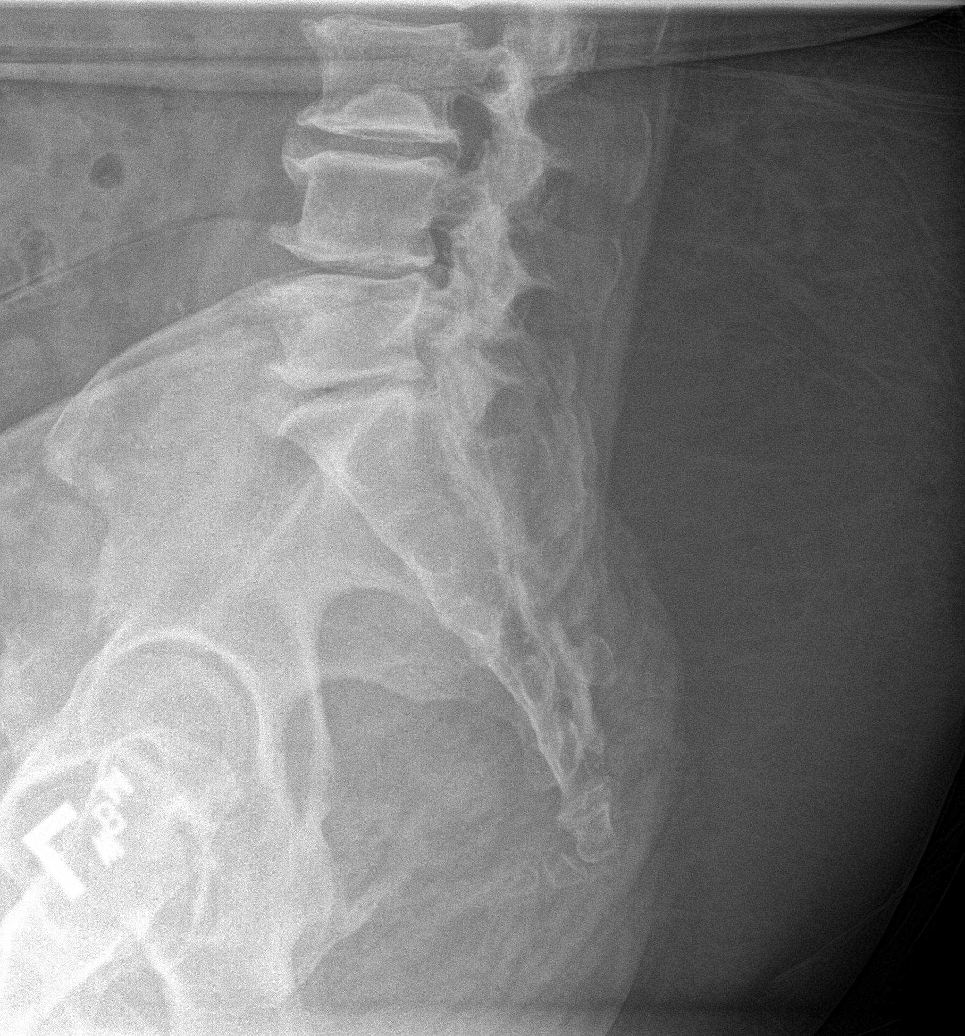

[l-spine lat]
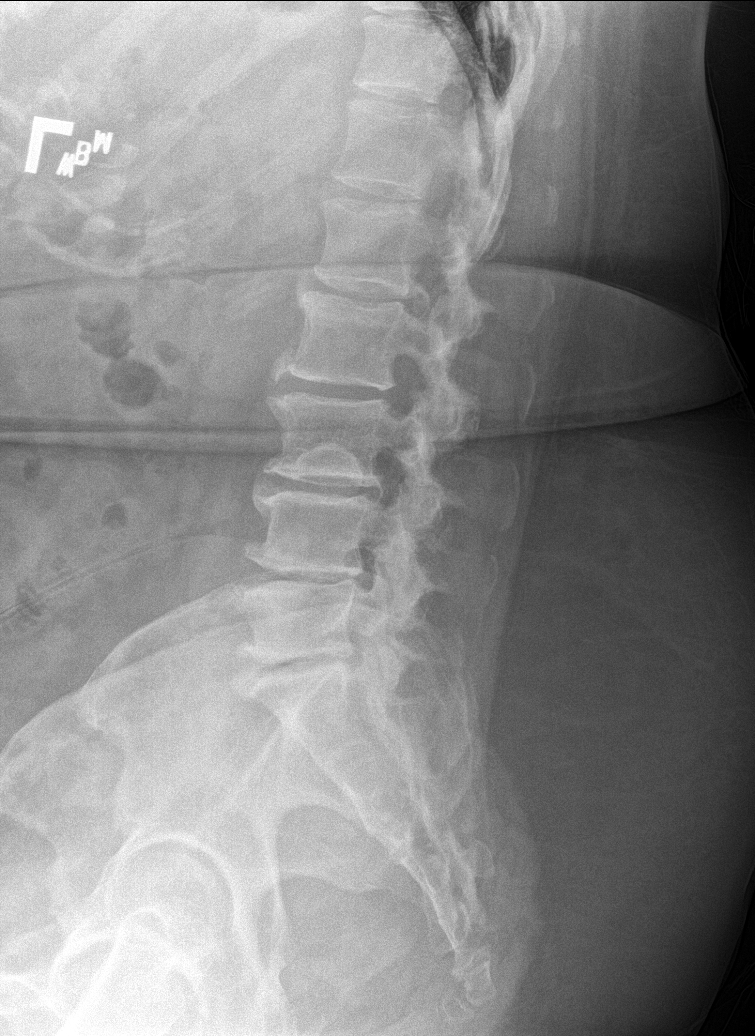

[5 of 5 positions shown; findings below may reference images not displayed]

FINDINGS: Appearance of transitional anatomy with 6 non rib-bearing lumbar
type vertebra. For the purposes of reporting, hypoplastic ribs will
be assumed at T12 and last well-formed lumbar type vertebra will be
designated L5.

Lumbar alignment normal. Vertebral body heights are normal. Diffuse
degenerative changes throughout the lumbar spine with moderate disc
space narrowing L3-L4, L4-L5 and L5-S1. Bulky anterior osteophytes.
Large left lateral osteophyte at L3-L4. Facet degenerative changes
of the lower lumbar spine
IMPRESSION: No acute osseous abnormality.  Multilevel degenerative changes

## 2024-01-25 ENCOUNTER — Ambulatory Visit: Payer: Self-pay | Admitting: Neurology

## 2024-01-25 DIAGNOSIS — G4733 Obstructive sleep apnea (adult) (pediatric): Secondary | ICD-10-CM

## 2024-01-25 NOTE — Telephone Encounter (Signed)
 I called pt and relayed results of her sleep study. Mild OSA , recommend autopap to see if feel better. Authorize thru insurance then call to set up time to pick up machine , fit for mask. See back in 2-3 months after using machine for insurance compliance. Use machine 4 hrs or more.  She verbalized understanding.  She wants information to see what DME to use.  Will forward on mychart message for her.

## 2024-01-25 NOTE — Procedures (Signed)
 Physician Interpretation: Please see link under Procedure Tab or under Encounters tab for physician report, technical report, as well as O2 titration and/or PAP titration tables (if applicable).   Referred by: Dr. Modena Callander   History and Indication for Testing (obtained from visit note dated 12/02/2023): 68 year old female with an underlying complex medical history of stroke in May 2025, hypertension, hyperlipidemia, coronary artery disease, prior smoking, COPD, history of kidney stone, carotid artery disease with status post bilateral carotid endarterectomies, hypothyroidism, and overweight state, who reports snoring and nocturia. His Epworth sleepiness score is 3 out of 24, fatigue severity score is 15 out of 63.     Review of the EEG showed no abnormal electrical discharges and symmetrical bihemispheric findings.     EKG: The EKG revealed normal sinus rhythm (NSR). ***   AUDIO/VIDEO REVIEW: The audio and video review did not show any abnormal or unusual behaviors, movements, phonations or vocalizations. The patient took *** restroom breaks. Snoring was noted, ***   POST-STUDY QUESTIONNAIRE: Post study, the patient indicated, that sleep was *** the same as usual.    IMPRESSION:    Obstructive Sleep Apnea (OSA), *** ***Central Sleep Apnea (CSA) ***Primary Snoring ***Primary Central Sleep Apnea ***Complex Sleep Apnea ***PLMD (periodic limb movement disorder [of sleep]) ***Dysfunctions associated with sleep stages or arousal from sleep ***Non-specific abnormal electrocardiogram (EKG) ***Poor sleep pattern ***Inconclusive Test   RECOMMENDATIONS:         I certify that I have reviewed the entire raw data recording prior to the issuance of this report in accordance with the Standards of Accreditation of the American Academy of Sleep Medicine (AASM).   True Mar, MD, PhD Medical Director, [facility]) Diplomat, ABPN (Neurology and  Sleep)

## 2024-01-25 NOTE — Telephone Encounter (Signed)
-----   Message from True Mar sent at 01/25/2024  2:29 PM EDT ----- Patient referred by PCP, seen by me on 12/15/23, diagnostic PSG on 01/23/24.    Please call and notify the patient that the recent sleep study did confirm the diagnosis of obstructive sleep apnea. OSA is overall mild, but worth treating to see if she feels better after  treatment. To that end I recommend treatment for this in the form of autoPAP, which means, that we don't have to bring her back for a second sleep study with CPAP, but will let him try an autoPAP  machine at home, through a DME company (of her choice, or as per insurance requirement). The DME representative will educate her on how to use the machine, how to put the mask on, etc. I have placed  an order in the chart. Please send referral, talk to patient, send report to referring MD. We will need a FU in sleep clinic for 10 weeks post-PAP set up, please arrange that with me or one of our  NPs. Thanks,   True Mar, MD, PhD [facility])   ----- Message ----- From: Rebecka Fleeta Higashi In One Three One Sent: 01/25/2024   2:23 PM EDT To: True Mar, MD

## 2024-02-03 ENCOUNTER — Other Ambulatory Visit: Payer: Self-pay

## 2024-02-03 ENCOUNTER — Ambulatory Visit: Admitting: Family Medicine

## 2024-02-03 ENCOUNTER — Encounter: Payer: Self-pay | Admitting: Family Medicine

## 2024-02-03 VITALS — BP 125/70 | HR 61 | Temp 98.0°F | Ht 60.5 in | Wt 222.0 lb

## 2024-02-03 DIAGNOSIS — F3341 Major depressive disorder, recurrent, in partial remission: Secondary | ICD-10-CM

## 2024-02-03 DIAGNOSIS — G4733 Obstructive sleep apnea (adult) (pediatric): Secondary | ICD-10-CM

## 2024-02-03 DIAGNOSIS — N3944 Nocturnal enuresis: Secondary | ICD-10-CM

## 2024-02-03 DIAGNOSIS — Z23 Encounter for immunization: Secondary | ICD-10-CM

## 2024-02-03 MED ORDER — ZEPBOUND 5 MG/0.5ML ~~LOC~~ SOAJ
5.0000 mg | SUBCUTANEOUS | 1 refills | Status: DC
  Filled 2024-02-03: qty 2, 28d supply, fill #0

## 2024-02-03 MED ORDER — ZEPBOUND 2.5 MG/0.5ML ~~LOC~~ SOAJ
2.5000 mg | SUBCUTANEOUS | 0 refills | Status: DC
  Filled 2024-02-03: qty 2, 28d supply, fill #0

## 2024-02-03 NOTE — Assessment & Plan Note (Signed)
 Has not heard from pelvic floor PT. Will reach out to them.

## 2024-02-03 NOTE — Assessment & Plan Note (Signed)
 Doing great. Tolerating her medicine well. Call with any concerns.

## 2024-02-03 NOTE — Progress Notes (Signed)
 BP 125/70 (BP Location: Left Arm, Patient Position: Sitting, Cuff Size: Large)   Pulse 61   Temp 98 F (36.7 C)   Ht 5' 0.5 (1.537 m)   Wt 222 lb (100.7 kg)   LMP  (LMP Unknown)   SpO2 98%   BMI 42.64 kg/m    Subjective:    Patient ID: Maureen King, female    DOB: 28-Nov-1955, 68 y.o.   MRN: [id number]  HPI: Maureen King is a 68 y.o. female  Chief Complaint  Patient presents with   Obesity   SLEEP APNEA Sleep apnea status: newly diagnosed Duration: weeks Satisfied with current treatment?:  no CPAP use:  no Sleep quality with CPAP use: doesn't have one yet Treament compliance:good compliance Last sleep study: 01/23/24 Treatments attempted: none Wakes feeling refreshed:  no Daytime hypersomnolence:  yes Fatigue:  yes Insomnia:  no Good sleep hygiene:  yes Difficulty falling asleep:  no Difficulty staying asleep:  no Snoring bothers bed partner:  yes Observed apnea by bed partner: yes Obesity:  yes Hypertension: no  Pulmonary hypertension:  no Coronary artery disease:  no  DEPRESSION Mood status: controlled Satisfied with current treatment?: yes Symptom severity: mild  Duration of current treatment : chronic Side effects: no Medication compliance: excellent compliance Psychotherapy/counseling: no  Previous psychiatric medications: paxil  Depressed mood: no Anxious mood: no Anhedonia: no Significant weight loss or gain: no Insomnia: no  Fatigue: yes Feelings of worthlessness or guilt: no Impaired concentration/indecisiveness: no Suicidal ideations: no Hopelessness: no Crying spells: no    02/03/2024    8:44 AM 11/02/2023   10:17 AM 05/04/2023    9:55 AM 02/23/2023    9:53 AM 08/28/2022    9:13 AM  Depression screen PHQ 2/9  Decreased Interest 0 1 0 0 2  Down, Depressed, Hopeless 0 1 0 0 2  PHQ - 2 Score 0 2 0 0 4  Altered sleeping 0 1 0 1 2  Tired, decreased energy 0 1 0 0 2  Change in appetite 0 1 0 0 2  Feeling bad or failure about  yourself  0  0 0 2  Trouble concentrating 0  0 0 2  Moving slowly or fidgety/restless 0 0 0 0 1  Suicidal thoughts 0 0 0 0 0  PHQ-9 Score 0 5 0 1 15  Difficult doing work/chores  Not difficult at all Not difficult at all Not difficult at all Somewhat difficult    Clinical coverage for weight loss GLP's   Medication being dispensed is Zepbound  2 mL/28 days. Titration doses are 2 mL/28 days.   [x]  Product being prescribed is FDA approved for the indication, age, weight (if applicable) and not does not exceed dosing limits per the Prescribing Information per the clinical conditions for use.  [x]  Patient's baseline weight measured within the last 45 days as required by provider before dispensing.  [x]  Patient is new to therapy and One of the following:   [x]  The beneficiary is 67 years of age or over and has ONE of the following:  [x]  A BMI greater than or equal to 30 kg/m2  [x]  A BMI greater than or equal to 27 kg/m2 with at least one weight-related comorbidity/risk factor/complication (i.e. hypertension, type 2 diabetes, obstructive sleep apnea, cardiovascular disease, dyslipidemia)  [x]  If patient has one weight-related comorbidity/risk factor/complication (i.e. hypertension, type 2 diabetes, obstructive sleep apnea, cardiovascular disease, dyslipidemia), Patient suffers from weight-related comorbidity/risk factor/complication: OSA, depression, GERD, knee arthritis  []  The  beneficiary is 68 years of age or older with a BMI greater than or equal to 27 kg/m2 AND has established cardiovascular disease (CVD) defined as having a history of myocardial infarction, stroke, or symptomatic peripheral disease, to be documented on the PA form. AND  [x]  The beneficiary has participated in 6 months or greater of lifestyle modification and will continue while on medication including structured nutrition following provider recommend dietary modifications and physical activity, unless physical activity  is not clinically appropriate at the time GLP1 therapy commences AND  [x]  The beneficiary will NOT be using the requested agent in combination with another GLP-1 receptor agonist agent AND  [x]  The beneficiary does NOT have any FDA-labeled contraindications to the requested agent, including pregnancy, lactation, history of medullary thyroid  cancer or multiple endocrine neoplasia type II.   Last BMI/Weight/Height recorded Estimated body mass index is 42.64 kg/m as calculated from the following:   Height as of this encounter: 5' 0.5 (1.537 m).   Weight as of this encounter: 222 lb (100.7 kg).   Relevant past medical, surgical, family and social history reviewed and updated as indicated. Interim medical history since our last visit reviewed. Allergies and medications reviewed and updated.  Review of Systems  Constitutional:  Positive for fatigue. Negative for activity change, appetite change, chills, diaphoresis, fever and unexpected weight change.  Respiratory: Negative.    Cardiovascular: Negative.   Musculoskeletal: Negative.   Skin: Negative.   Psychiatric/Behavioral: Negative.      Per HPI unless specifically indicated above     Objective:    BP 125/70 (BP Location: Left Arm, Patient Position: Sitting, Cuff Size: Large)   Pulse 61   Temp 98 F (36.7 C)   Ht 5' 0.5 (1.537 m)   Wt 222 lb (100.7 kg)   LMP  (LMP Unknown)   SpO2 98%   BMI 42.64 kg/m   Wt Readings from Last 3 Encounters:  02/03/24 222 lb (100.7 kg)  12/15/23 221 lb (100.2 kg)  11/02/23 223 lb 3.2 oz (101.2 kg)    Physical Exam Vitals and nursing note reviewed.  Constitutional:      General: She is not in acute distress.    Appearance: Normal appearance. She is obese. She is not ill-appearing, toxic-appearing or diaphoretic.  HENT:     Head: Normocephalic and atraumatic.     Right Ear: External ear normal.     Left Ear: External ear normal.     Nose: Nose normal.     Mouth/Throat:     Mouth:  Mucous membranes are moist.     Pharynx: Oropharynx is clear.  Eyes:     General: No scleral icterus.       Right eye: No discharge.        Left eye: No discharge.     Extraocular Movements: Extraocular movements intact.     Conjunctiva/sclera: Conjunctivae normal.     Pupils: Pupils are equal, round, and reactive to light.  Cardiovascular:     Rate and Rhythm: Normal rate and regular rhythm.     Pulses: Normal pulses.     Heart sounds: Normal heart sounds. No murmur heard.    No friction rub. No gallop.  Pulmonary:     Effort: Pulmonary effort is normal. No respiratory distress.     Breath sounds: Normal breath sounds. No stridor. No wheezing, rhonchi or rales.  Chest:     Chest wall: No tenderness.  Musculoskeletal:        General: Normal range  of motion.     Cervical back: Normal range of motion and neck supple.  Skin:    General: Skin is warm and dry.     Capillary Refill: Capillary refill takes less than 2 seconds.     Coloration: Skin is not jaundiced or pale.     Findings: No bruising, erythema, lesion or rash.  Neurological:     General: No focal deficit present.     Mental Status: She is alert and oriented to person, place, and time. Mental status is at baseline.  Psychiatric:        Mood and Affect: Mood normal.        Behavior: Behavior normal.        Thought Content: Thought content normal.        Judgment: Judgment normal.     Results for orders placed or performed in visit on 05/04/23  CBC with Differential/Platelet   Collection Time: 05/04/23  9:55 AM  Result Value Ref Range   WBC 5.9 3.4 - 10.8 x10E3/uL   RBC 4.49 3.77 - 5.28 x10E6/uL   Hemoglobin 13.2 11.1 - 15.9 g/dL   Hematocrit 57.9 65.9 - 46.6 %   MCV 94 79 - 97 fL   MCH 29.4 26.6 - 33.0 pg   MCHC 31.4 (L) 31.5 - 35.7 g/dL   RDW 87.3 88.2 - 84.5 %   Platelets 277 150 - 450 x10E3/uL   Neutrophils 75 Not Estab. %   Lymphs 14 Not Estab. %   Monocytes 6 Not Estab. %   Eos 4 Not Estab. %   Basos  1 Not Estab. %   Neutrophils Absolute 4.4 1.4 - 7.0 x10E3/uL   Lymphocytes Absolute 0.9 0.7 - 3.1 x10E3/uL   Monocytes Absolute 0.4 0.1 - 0.9 x10E3/uL   EOS (ABSOLUTE) 0.3 0.0 - 0.4 x10E3/uL   Basophils Absolute 0.0 0.0 - 0.2 x10E3/uL   Immature Granulocytes 0 Not Estab. %   Immature Grans (Abs) 0.0 0.0 - 0.1 x10E3/uL  Comprehensive metabolic panel   Collection Time: 05/04/23  9:55 AM  Result Value Ref Range   Glucose 88 70 - 99 mg/dL   BUN 17 8 - 27 mg/dL   Creatinine, Ser 9.33 0.57 - 1.00 mg/dL   eGFR 97 >40 fO/fpw/8.26   BUN/Creatinine Ratio 26 12 - 28   Sodium 140 134 - 144 mmol/L   Potassium 4.3 3.5 - 5.2 mmol/L   Chloride 104 96 - 106 mmol/L   CO2 26 20 - 29 mmol/L   Calcium 9.4 8.7 - 10.3 mg/dL   Total Protein 6.0 6.0 - 8.5 g/dL   Albumin 3.9 3.9 - 4.9 g/dL   Globulin, Total 2.1 1.5 - 4.5 g/dL   Bilirubin Total 0.3 0.0 - 1.2 mg/dL   Alkaline Phosphatase 111 44 - 121 IU/L   AST 21 0 - 40 IU/L   ALT 21 0 - 32 IU/L  Lipid Panel w/o Chol/HDL Ratio   Collection Time: 05/04/23  9:55 AM  Result Value Ref Range   Cholesterol, Total 226 (H) 100 - 199 mg/dL   Triglycerides 811 (H) 0 - 149 mg/dL   HDL 77 >60 mg/dL   VLDL Cholesterol Cal 32 5 - 40 mg/dL   LDL Chol Calc (NIH) 882 (H) 0 - 99 mg/dL  TSH   Collection Time: 05/04/23  9:55 AM  Result Value Ref Range   TSH 1.330 0.450 - 4.500 uIU/mL      Assessment & Plan:   Problem List Items  Addressed This Visit       Respiratory   OSA (obstructive sleep apnea) - Primary   Diagnosed recently. In process of getting CPAP. Has claustrophobia so anxious about using it. Would benefit from zepbound  for OSA. Rx sent to her pharmacy. Follow up 6-8 weeks.      Relevant Medications   tirzepatide  (ZEPBOUND ) 2.5 MG/0.5ML Pen   tirzepatide  (ZEPBOUND ) 5 MG/0.5ML Pen (Start on 03/02/2024)     Other   Morbid obesity (HCC)   Would benefit from zepbound  due to OSA. Will start her on it and recheck in 6-8 weeks. Continue portion control  and exercise. Call with any concerns.       Relevant Medications   tirzepatide  (ZEPBOUND ) 2.5 MG/0.5ML Pen   tirzepatide  (ZEPBOUND ) 5 MG/0.5ML Pen (Start on 03/02/2024)   Depression   Doing great. Tolerating her medicine well. Call with any concerns.       Nocturnal enuresis   Has not heard from pelvic floor PT. Will reach out to them.       Other Visit Diagnoses       Needs flu shot       Flu shot given today.   Relevant Orders   Flu vaccine HIGH DOSE PF(Fluzone Trivalent) (Completed)        Follow up plan: Return 6-8 weeks.

## 2024-02-03 NOTE — Assessment & Plan Note (Signed)
 Diagnosed recently. In process of getting CPAP. Has claustrophobia so anxious about using it. Would benefit from zepbound  for OSA. Rx sent to her pharmacy. Follow up 6-8 weeks.

## 2024-02-03 NOTE — Assessment & Plan Note (Signed)
 Would benefit from zepbound  due to OSA. Will start her on it and recheck in 6-8 weeks. Continue portion control and exercise. Call with any concerns.

## 2024-02-07 NOTE — Telephone Encounter (Signed)
 Faxed to [place] [facility] request for DME PAP therapy and supplies.  Pt has [employer].  Fx [phone].

## 2024-02-08 ENCOUNTER — Other Ambulatory Visit: Payer: Self-pay

## 2024-02-08 ENCOUNTER — Ambulatory Visit: Attending: Family Medicine

## 2024-02-08 DIAGNOSIS — Z87448 Personal history of other diseases of urinary system: Secondary | ICD-10-CM | POA: Insufficient documentation

## 2024-02-08 DIAGNOSIS — N3946 Mixed incontinence: Secondary | ICD-10-CM | POA: Insufficient documentation

## 2024-02-08 DIAGNOSIS — M6281 Muscle weakness (generalized): Secondary | ICD-10-CM | POA: Diagnosis not present

## 2024-02-08 DIAGNOSIS — R2689 Other abnormalities of gait and mobility: Secondary | ICD-10-CM | POA: Insufficient documentation

## 2024-02-08 DIAGNOSIS — N3944 Nocturnal enuresis: Secondary | ICD-10-CM | POA: Diagnosis not present

## 2024-02-08 DIAGNOSIS — R293 Abnormal posture: Secondary | ICD-10-CM | POA: Insufficient documentation

## 2024-02-08 NOTE — Patient Instructions (Signed)

## 2024-02-08 NOTE — Therapy (Signed)
 OUTPATIENT PHYSICAL THERAPY FEMALE PELVIC EVALUATION   Patient Name: Maureen King MRN: [id number] DOB:[birth year], 68 y.o., female Today's Date: 02/08/2024  END OF SESSION:  PT End of Session - 02/08/24 1200     Visit Number 1    Number of Visits 9    Date for PT Re-Evaluation 04/08/24    Authorization Type [employer]    Progress Note Due on Visit 10    PT Start Time 1146    PT Stop Time 1236    PT Time Calculation (min) 50 min    Activity Tolerance Patient tolerated treatment well    Behavior During Therapy WFL for tasks assessed/performed          Past Medical History:  Diagnosis Date   Anxiety    Depression    GERD (gastroesophageal reflux disease)    Insomnia    Obesity    Palpitations    Past Surgical History:  Procedure Laterality Date   ABDOMINAL HYSTERECTOMY  08/29/20   COLONOSCOPY     COLPOSCOPY N/A 05/30/2020   Procedure: COLPOSCOPY;  Surgeon: Victor Claudell SAUNDERS, MD;  Location: [facility];  Service: Gynecology;  Laterality: N/A;   CYSTOSCOPY N/A 08/29/2020   Procedure: CYSTOSCOPY;  Surgeon: Victor Claudell SAUNDERS, MD;  Location: [facility];  Service: Gynecology;  Laterality: N/A;   HYSTEROSCOPY WITH D & C N/A 05/30/2020   Procedure: DILATATION AND CURETTAGE /HYSTEROSCOPY;  Surgeon: Victor Claudell SAUNDERS, MD;  Location: [facility];  Service: Gynecology;  Laterality: N/A;   NO PAST SURGERIES     ROBOTIC ASSISTED LAPAROSCOPIC HYSTERECTOMY AND SALPINGECTOMY Bilateral 08/29/2020   Procedure: XI ROBOTIC ASSISTED LAPAROSCOPIC HYSTERECTOMY AND SALPINGECTOMY, BILATERAL OOPHORECTOMY;  Surgeon: Victor Claudell SAUNDERS, MD;  Location: [facility];  Service: Gynecology;  Laterality: Bilateral;   Patient Active Problem List   Diagnosis Date Noted   OSA (obstructive sleep apnea) 02/03/2024   Nocturnal enuresis 11/02/2023   Osteopenia of neck of femur 06/22/2022   Advance directive discussed with patient 02/27/2022   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   Bursitis of  both hips 08/11/2021   Osteoarthritis of knees, bilateral 08/11/2021   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   Endometrial hyperplasia without atypia    Endometrial polyp    Bradycardia 03/08/2017   Morbid obesity (HCC) 02/12/2015   Depression 02/12/2015   Insomnia 02/12/2015   GERD (gastroesophageal reflux disease) 02/12/2015    PCP: Duwaine Louder, DO  REFERRING PROVIDER: Duwaine Louder, DO  REFERRING DIAG: Nocturnal enuresis  THERAPY DIAG:  Muscle weakness (generalized)  Abnormal posture  History of nocturnal enuresis  Other abnormalities of gait and mobility  Urinary incontinence, mixed  Rationale for Evaluation and Treatment: Rehabilitation  ONSET DATE: 11/02/23 referral date  SUBJECTIVE:  SUBJECTIVE STATEMENT: URINARY FUNCTION: pt can go up to six hours in b/t voids, she drinks water until she goes to bed 9:30pm. She gets up one time/night (on average). Pt denied chronic UTIs or yeast infection, and no pain. Strong stream and fully emptying. Wakes up damp from urine but not gushing. BOWEL FUNCTION: at least once/day. 50% of the time types 3 and 4 and then 50% of the time softer stools. No fiber or laxatives. Pt denied fecal leakage, pain, or hemorrhoids. Able to fully empty.  CORE STABILITY: hx of laparoscopic hysterectomy, no other ab. Surgeries. Has LBP if she does a lot of work outside, feels like core is weak. Pt reported bad fall when in her late 30s on icy steps and fell directly on buttocks/tailbone. Pt denied MVAs or accidents. Does have hx of OA, osteopenia, and bursitis (B).  SEXUAL FUNCTION: pt reported dryness with tampon use (when she had a period), pt denied painful OBGYN or intercourse. Pt denied difficulty with climaxing. Pt reported dryness intermittently s/p  hysterectomy.   Fluid intake: starts with coffee (12 oz./day), drinks approx. 4-5, 16.9 oz. bottles A day, sometimes unsweetened or half and half tea, diet coke if she goes out (notices soft drinks incr. Nocturnal enuresis), rarely alcohol  PAIN:  Are you having pain? No When she has back pain: NPRS scale: 3/10, achy and nagging after working in the yard. Resting and stretching helps.   PRECAUTIONS: None  RED FLAGS: None   WEIGHT BEARING RESTRICTIONS: No   FALLS:  Has patient fallen in last 6 months? No but did fall last November.   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  ACTIVITY LEVEL : likes to work in her yard, crafts, sew, helps friends decorate, hang out with friends  PLOF: Independent  PATIENT GOALS: improve core strength to be able to go to silver  sneakers and tolerate it to be able to travel, incr. Endurance, stop leaking  PERTINENT HISTORY:  Depression, obesity, mild OSA, GERD, bursitis of both hips, OA of knees (B), chondromalacia patellae, R achilles tendonopathy but had surgery (steps are problematic), osteopenia of neck of femur, endometrial polyp, full hysterectomy 2022 Sexual abuse: No  BOWEL MOVEMENT: Pain with bowel movement: No Type of bowel movement:Frequency daily Fully empty rectum: Yes:   Leakage: No Pads: No Fiber supplement/laxative No  URINATION: Pain with urination: No Fully empty bladder: Yes:   Stream: Strong Urgency: Yes  Frequency: every 2-4 hours, up to 6 hours Leakage: Walking to the bathroom and Coughingif bladder is full. Pads: No but sometimes will wear one if she knows a bathroom won't be close by.  INTERCOURSE:  Ability to have vaginal penetration No  Pain with intercourse: none DrynessYes at times Climax: yes Marinoff Scale: 0/3   PREGNANCY: Number of pregnancies: 0 Vaginal deliveries 0 C-section deliveries 0 Currently pregnant No  PROLAPSE: None   OBJECTIVE:   Note: Objective measures were completed at Evaluation unless otherwise noted.   COGNITION: Overall cognitive status: Within functional limits for tasks assessed     SENSATION: Light touch: Appears intact   GAIT: Assistive device utilized: None Comments: wide BOS, decr. Trunk rot  POSTURE: rounded shoulders, decreased thoracic kyphosis, and posterior pelvic tilt   LUMBARAROM/PROM: all limited by approx. 25%   A/PROM A/PROM  eval  Flexion   Extension   Right lateral flexion   Left lateral flexion   Right rotation   Left rotation    (Blank  rows = not tested)  LOWER EXTREMITY ROM:  Active ROM Right eval Left eval  Hip flexion    Hip extension    Hip abduction    Hip adduction    Hip internal rotation    Hip external rotation    Knee flexion    Knee extension    Ankle dorsiflexion    Ankle plantarflexion    Ankle inversion    Ankle eversion     (Blank rows = not tested)  LOWER EXTREMITY MMT:  MMT Right eval Left eval  Hip flexion    Hip extension    Hip abduction    Hip adduction    Hip internal rotation    Hip external rotation    Knee flexion    Knee extension    Ankle dorsiflexion    Ankle plantarflexion    Ankle inversion    Ankle eversion     (Blank rows = not tested) PALPATION:   General: no TTP during palpation of spine and SIJ in standing    PELVIC MMT:   MMT eval  Vaginal   Internal Anal Sphincter   External Anal Sphincter   Puborectalis   Diastasis Recti   (Blank rows = not tested)        TONE: limited by time constraints   PROLAPSE: limited by time constraints   TODAY'S TREATMENT:                                                                                                                              DATE: 02/08/24  EVAL   SELF CARE: PATIENT EDUCATION:  Education details: PT educated pt on main functions of the pelvic floor, IAP, breath and PFM relationship. PT discussed POC, frequency and duration. PT provided  the following education: TOILET POSTURE: Urination: feet flat, lean forward with forearms on legs to fully empty bladder. Bowel movement: place feet flat on Squatty Potty or stool so knees are higher than hips, lean forward to relax pelvic floor in order to avoid strain.  SHOES: wear supportive shoes, and sandals with straps.  POSTURE: try not to cross legs at knees or ankles. Try the figure four stretch instead.  WATER: start with water first thing in the morning.   PELVIC TILTS: try to stand in neutral, not tucking your tail and not arching back, but in the middle.  Person educated: Patient Education method: Explanation, Demonstration, and Handouts Education comprehension: verbalized understanding and needs further education  HOME EXERCISE PROGRAM: Not yet established  ASSESSMENT:  CLINICAL IMPRESSION: Patient is a pleasant 68 y.o. female who was seen today for physical therapy evaluation and treatment for nocturnal enuresis, mixed urinary incontinence and back pain.  Pt's PMH is significant for the following: Depression, obesity, mild OSA, GERD, bursitis of both hips, OA of knees (B), chondromalacia patellae, R achilles tendonopathy but had surgery (steps are problematic), osteopenia of neck of femur, endometrial polyp, full hysterectomy 2022. The following impairments  were noted upon exam: limited ROM, back pain, postural dysfunction, decr. Strength likely 2/2 subjective reports and gait deviations, nocturnal enuresis, impaired balance, and mixed UI. Pt would benefit from skilled PT to improve safety and decr. Pain during all ADLs.   OBJECTIVE IMPAIRMENTS: Abnormal gait, decreased balance, decreased coordination, decreased endurance, decreased mobility, decreased ROM, decreased strength, hypomobility, increased fascial restrictions, impaired flexibility, postural dysfunction, obesity, and pain.   ACTIVITY LIMITATIONS: carrying, lifting, bending, standing, squatting, sleeping,  transfers, continence, toileting, locomotion level, and caring for others  PARTICIPATION LIMITATIONS: meal prep, cleaning, laundry, interpersonal relationship, shopping, and community activity  PERSONAL FACTORS: Age, Past/current experiences, and 3+ comorbidities: see above are also affecting patient's functional outcome.   REHAB POTENTIAL: Good  CLINICAL DECISION MAKING: Stable/uncomplicated  EVALUATION COMPLEXITY: Low   GOALS: Goals reviewed with patient? Yes  SHORT TERM GOALS: Target date: for all STGs: 03/07/24  Pt will be IND in HEP to improve pain, strength, coordination. Baseline: no HEP Goal status: INITIAL  2.  Finish exam and write goals as indicated. Baseline: limited by time constraints Goal status: INITIAL  3.  Pt will demo proper toileting posture to fully empty bladder and reduce straining during bowel movement. Baseline: unable to demo Goal status: INITIAL  4.  Pt will demonstrated improved relaxation and contraction of PFM with coordination of breath to reduce urinary leakage to </=once/week. Baseline: mixed UI intermittently Goal status: INITIAL   LONG TERM GOALS: Target date: for all LTGs: 04/04/24  Pt will demonstrated improved relaxation and contraction of PFM with coordination of breath to reduce nocturnal enuresis to </=once/week. Baseline: nightly, damp but not gushing urine when she awakes Goal status: INITIAL  2.  Pt will join a gym to maximize gains made during PT to improve QOL, reduce leakage, and continue to improve strength. Baseline: no gym Goal status: INITIAL  3.  Pt will be able to amb. 1000' over uneven and even terrain without leaking to improve endurance in prep for travel. Baseline: mixed UI Goal status: INITIAL  4. Pt will be IND in traversing 4 steps without an incr. In pain or leakage to safely traverse steps in the community.   Baseline: impaired balance and back/knee/hip/foot pain traversing stairs Goal status:  INITIAL  PLAN: finish exam (palpation, ROM, MMT, DR) Establish HEP. Scar mob (hysterectomy scar)   PT FREQUENCY: 1x/week  PT DURATION: 8 weeks  PLANNED INTERVENTIONS: 97164- PT Re-evaluation, 97110-Therapeutic exercises, 97530- Therapeutic activity, 97112- Neuromuscular re-education, 97535- Self Care, 02859- Manual therapy, [phone]- Gait training, [phone] (1-2 muscles), [phone] (3+ muscles)- Dry Needling, Patient/Family education, Balance training, Stair training, Taping, Joint mobilization, Spinal mobilization, Scar mobilization, Cryotherapy, Moist heat, and Biofeedback     Brynda Heick L, PT 02/08/2024, 1:58 PM  Delon Pinal, PT,DPT 02/08/24 1:58 PM Phone: [phone] Fax: [phone]

## 2024-02-09 ENCOUNTER — Telehealth: Payer: Self-pay | Admitting: Pharmacy Technician

## 2024-02-09 ENCOUNTER — Other Ambulatory Visit (HOSPITAL_COMMUNITY): Payer: Self-pay

## 2024-02-09 NOTE — Telephone Encounter (Signed)
 Pharmacy Patient Advocate Encounter   Received notification from Onbase that prior authorization for Zepbound  2.5mg /0.22ml auto-injectors is required/requested.   Insurance verification completed.   The patient is insured through [facility] .   Medicare covers if the patient has moderate to severe OSA with an AHI of 15 or above.  Her recent sleep study reveals mild OSA with at AHI of 11.2.   She does not meet the coverage criteria.    CMM KEY# [id number]

## 2024-02-10 NOTE — Telephone Encounter (Signed)
 Please let her know the below.

## 2024-02-14 ENCOUNTER — Other Ambulatory Visit (HOSPITAL_COMMUNITY): Payer: Self-pay

## 2024-02-17 ENCOUNTER — Other Ambulatory Visit: Payer: Self-pay

## 2024-02-17 ENCOUNTER — Ambulatory Visit

## 2024-02-17 DIAGNOSIS — R2689 Other abnormalities of gait and mobility: Secondary | ICD-10-CM

## 2024-02-17 DIAGNOSIS — M6281 Muscle weakness (generalized): Secondary | ICD-10-CM | POA: Diagnosis not present

## 2024-02-17 DIAGNOSIS — Z87448 Personal history of other diseases of urinary system: Secondary | ICD-10-CM

## 2024-02-17 DIAGNOSIS — R293 Abnormal posture: Secondary | ICD-10-CM

## 2024-02-17 DIAGNOSIS — N3946 Mixed incontinence: Secondary | ICD-10-CM

## 2024-02-17 NOTE — Therapy (Signed)
 OUTPATIENT PHYSICAL THERAPY FEMALE PELVIC TREATMENT   Patient Name: Maureen King MRN: [id number] DOB:08/24/1955, 68 y.o., female Today's Date: 02/17/2024  END OF SESSION:  PT End of Session - 02/17/24 1450     Visit Number 2    Number of Visits 9    Date for Recertification  04/08/24    Authorization Type [employer]    Progress Note Due on Visit 10    PT Start Time 1448    PT Stop Time 1528    PT Time Calculation (min) 40 min    Activity Tolerance Patient tolerated treatment well    Behavior During Therapy WFL for tasks assessed/performed          Past Medical History:  Diagnosis Date   Anxiety    Depression    GERD (gastroesophageal reflux disease)    Insomnia    Obesity    Palpitations    Past Surgical History:  Procedure Laterality Date   ABDOMINAL HYSTERECTOMY  08/29/20   COLONOSCOPY     COLPOSCOPY N/A 05/30/2020   Procedure: COLPOSCOPY;  Surgeon: Victor Claudell SAUNDERS, MD;  Location: [facility];  Service: Gynecology;  Laterality: N/A;   CYSTOSCOPY N/A 08/29/2020   Procedure: CYSTOSCOPY;  Surgeon: Victor Claudell SAUNDERS, MD;  Location: [facility];  Service: Gynecology;  Laterality: N/A;   HYSTEROSCOPY WITH D & C N/A 05/30/2020   Procedure: DILATATION AND CURETTAGE /HYSTEROSCOPY;  Surgeon: Victor Claudell SAUNDERS, MD;  Location: [facility];  Service: Gynecology;  Laterality: N/A;   NO PAST SURGERIES     ROBOTIC ASSISTED LAPAROSCOPIC HYSTERECTOMY AND SALPINGECTOMY Bilateral 08/29/2020   Procedure: XI ROBOTIC ASSISTED LAPAROSCOPIC HYSTERECTOMY AND SALPINGECTOMY, BILATERAL OOPHORECTOMY;  Surgeon: Victor Claudell SAUNDERS, MD;  Location: [facility];  Service: Gynecology;  Laterality: Bilateral;   Patient Active Problem List   Diagnosis Date Noted   OSA (obstructive sleep apnea) 02/03/2024   Nocturnal enuresis 11/02/2023   Osteopenia of neck of femur 06/22/2022   Advance directive discussed with patient 02/27/2022   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   Bursitis of  both hips 08/11/2021   Osteoarthritis of knees, bilateral 08/11/2021   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   Endometrial hyperplasia without atypia    Endometrial polyp    Bradycardia 03/08/2017   Morbid obesity (HCC) 02/12/2015   Depression 02/12/2015   Insomnia 02/12/2015   GERD (gastroesophageal reflux disease) 02/12/2015    PCP: Duwaine Louder, DO  REFERRING PROVIDER: Duwaine Louder, DO  REFERRING DIAG: Nocturnal enuresis  THERAPY DIAG:  Muscle weakness (generalized)  Abnormal posture  History of nocturnal enuresis  Other abnormalities of gait and mobility  Urinary incontinence, mixed  Rationale for Evaluation and Treatment: Rehabilitation  ONSET DATE: 11/02/23 referral date  SUBJECTIVE:  SUBJECTIVE STATEMENT: Pt reported she's incorporating toileting posture and more water (about 5 bottles). She has slept better this week. Zepbound  was denied.  EVAL:  URINARY FUNCTION: pt can go up to six hours in b/t voids, she drinks water until she goes to bed 9:30pm. She gets up one time/night (on average). Pt denied chronic UTIs or yeast infection, and no pain. Strong stream and fully emptying. Wakes up damp from urine but not gushing. BOWEL FUNCTION: at least once/day. 50% of the time types 3 and 4 and then 50% of the time softer stools. No fiber or laxatives. Pt denied fecal leakage, pain, or hemorrhoids. Able to fully empty.  CORE STABILITY: hx of laparoscopic hysterectomy, no other ab. Surgeries. Has LBP if she does a lot of work outside, feels like core is weak. Pt reported bad fall when in her late 30s on icy steps and fell directly on buttocks/tailbone. Pt denied MVAs or accidents. Does have hx of OA, osteopenia, and bursitis (B).  SEXUAL FUNCTION: pt reported dryness with tampon use (when  she had a period), pt denied painful OBGYN or intercourse. Pt denied difficulty with climaxing. Pt reported dryness intermittently s/p hysterectomy.   Fluid intake: starts with coffee (12 oz./day), drinks approx. 4-5, 16.9 oz. bottles A day, sometimes unsweetened or half and half tea, diet coke if she goes out (notices soft drinks incr. Nocturnal enuresis), rarely alcohol  PAIN:  Are you having pain? No When she has back pain: NPRS scale: 3/10, achy and nagging after working in the yard. Resting and stretching helps.   PRECAUTIONS: None  RED FLAGS: None   WEIGHT BEARING RESTRICTIONS: No   FALLS:  Has patient fallen in last 6 months? No but did fall last November.   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  ACTIVITY LEVEL : likes to work in her yard, crafts, sew, helps friends decorate, hang out with friends  PLOF: Independent  PATIENT GOALS: improve core strength to be able to go to silver  sneakers and tolerate it to be able to travel, incr. Endurance, stop leaking  PERTINENT HISTORY:  Depression, obesity, mild OSA, GERD, bursitis of both hips, OA of knees (B), chondromalacia patellae, R achilles tendonopathy but had surgery (steps are problematic), osteopenia of neck of femur, endometrial polyp, full hysterectomy 2022 Sexual abuse: No  BOWEL MOVEMENT: Pain with bowel movement: No Type of bowel movement:Frequency daily Fully empty rectum: Yes:   Leakage: No Pads: No Fiber supplement/laxative No  URINATION: Pain with urination: No Fully empty bladder: Yes:   Stream: Strong Urgency: Yes  Frequency: every 2-4 hours, up to 6 hours Leakage: Walking to the bathroom and Coughingif bladder is full. Pads: No but sometimes will wear one if she knows a bathroom won't be close by.  INTERCOURSE:  Ability to have vaginal penetration No  Pain with intercourse: none DrynessYes at times Climax: yes Marinoff Scale:  0/3   PREGNANCY: Number of pregnancies: 0 Vaginal deliveries 0 C-section deliveries 0 Currently pregnant No  PROLAPSE: None   OBJECTIVE:  Note: Objective measures were completed at Evaluation unless otherwise noted.   COGNITION: Overall cognitive status: Within functional limits for tasks assessed     SENSATION: Light touch: Appears intact   GAIT: Assistive device utilized: None Comments: wide BOS, decr. Trunk rot  POSTURE: rounded shoulders, decreased thoracic kyphosis, and posterior pelvic tilt   LUMBARAROM/PROM: all limited by approx. 25%   A/PROM A/PROM  eval  Flexion  Extension   Right lateral flexion   Left lateral flexion   Right rotation   Left rotation    (Blank rows = not tested)  LOWER EXTREMITY ROM: all WNL except for limited B hip IR.  Active ROM Right eval Left eval  Hip flexion    Hip extension    Hip abduction    Hip adduction    Hip internal rotation    Hip external rotation    Knee flexion    Knee extension    Ankle dorsiflexion    Ankle plantarflexion    Ankle inversion    Ankle eversion     (Blank rows = not tested)  LOWER EXTREMITY MMT:  MMT Right eval Left eval  Hip flexion 4 4  Hip extension    Hip abduction 2+ 2+  Hip adduction 3+ 4-  Hip internal rotation 3+ 3+  Hip external rotation 4 4  Knee flexion 4 4  Knee extension 5 5  Ankle dorsiflexion 5 with soreness at surgical site 5  Ankle plantarflexion    Ankle inversion    Ankle eversion     (Blank rows = not tested) PALPATION:   General: no TTP during palpation of spine and SIJ in standing 9/18: no LLD noted. Incr. Infrasternal angle noted, no DR. No pain with palpation over spine or paraspinals but spine hypomobility noted.    PELVIC MMT:   MMT eval  Vaginal   Internal Anal Sphincter   External Anal Sphincter   Puborectalis   Diastasis Recti   (Blank rows = not tested)        TONE:   PROLAPSE:   TODAY'S TREATMENT:                                                                                                                               DATE: 02/17/24   Physical function test: PT completed exam (palpation, MMT, DR, ROM). See above for details.  NMR: Access Code: [email or web address] URL: [email or web address] Date: 02/17/2024 Prepared by: Delon Pinal  Exercises - Supine Angels  - 1 x daily - 7 x weekly - 1 sets - 10 reps - Sidelying Open Book  - 1 x daily - 7 x weekly - 1 sets - 10 reps - Sidelying Diaphragmatic Breathing  - 1 x daily - 7 x weekly - 1 sets - 5 reps -Supine and seated Diaphragmatic breathing (too much vertical translation noted), improved in sidelying. Cues and demo for proper technique. S for safety. No pain reported.    SELF CARE: PATIENT EDUCATION:  Education details: PT educated pt on exam findings, and re-educated pt on IAP and new HEP. Person educated: Patient Education method: Explanation, Demonstration, and Handouts Education comprehension: verbalized understanding and needs further education  HOME EXERCISE PROGRAM: Not yet established  ASSESSMENT:  CLINICAL IMPRESSION: Skilled session focused on completing exam and establishing HEP to improve posture and mobility to improve IAP management and decr. PFM tension.  The following impairments continue to be noted: limited ROM, back pain, postural dysfunction, decr. Strength, nocturnal enuresis, impaired balance, and mixed UI. Pt would continue to benefit from skilled PT to improve safety and decr. Pain during all ADLs.   OBJECTIVE IMPAIRMENTS: Abnormal gait, decreased balance, decreased coordination, decreased endurance, decreased mobility, decreased ROM, decreased strength, hypomobility, increased fascial restrictions, impaired flexibility, postural dysfunction, obesity, and pain.   ACTIVITY LIMITATIONS: carrying, lifting, bending, standing, squatting, sleeping, transfers, continence, toileting, locomotion level, and caring for  others  PARTICIPATION LIMITATIONS: meal prep, cleaning, laundry, interpersonal relationship, shopping, and community activity  PERSONAL FACTORS: Age, Past/current experiences, and 3+ comorbidities: see above are also affecting patient's functional outcome.   REHAB POTENTIAL: Good  CLINICAL DECISION MAKING: Stable/uncomplicated  EVALUATION COMPLEXITY: Low   GOALS: Goals reviewed with patient? Yes  SHORT TERM GOALS: Target date: for all STGs: 03/07/24  Pt will be IND in HEP to improve pain, strength, coordination. Baseline: no HEP Goal status: INITIAL  2.  Finish exam and write goals as indicated. Baseline: limited by time constraints Goal status: MET  3.  Pt will demo proper toileting posture to fully empty bladder and reduce straining during bowel movement. Baseline: unable to demo Goal status: INITIAL  4.  Pt will demonstrated improved relaxation and contraction of PFM with coordination of breath to reduce urinary leakage to </=once/week. Baseline: mixed UI intermittently Goal status: INITIAL   LONG TERM GOALS: Target date: for all LTGs: 04/04/24  Pt will demonstrated improved relaxation and contraction of PFM with coordination of breath to reduce nocturnal enuresis to </=once/week. Baseline: nightly, damp but not gushing urine when she awakes Goal status: INITIAL  2.  Pt will join a gym to maximize gains made during PT to improve QOL, reduce leakage, and continue to improve strength. Baseline: no gym Goal status: INITIAL  3.  Pt will be able to amb. 1000' over uneven and even terrain without leaking to improve endurance in prep for travel. Baseline: mixed UI Goal status: INITIAL  4. Pt will be IND in traversing 4 steps without an incr. In pain or leakage to safely traverse steps in the community.   Baseline: impaired balance and back/knee/hip/foot pain traversing stairs Goal status: INITIAL  PLAN: Review HEP prn, internal assessment prn, manual therapy,  strengthening Scar mob (hysterectomy scar)   PT FREQUENCY: 1x/week  PT DURATION: 8 weeks  PLANNED INTERVENTIONS: 97164- PT Re-evaluation, 97110-Therapeutic exercises, 97530- Therapeutic activity, 97112- Neuromuscular re-education, 97535- Self Care, 02859- Manual therapy, [id number]- Gait training, [phone] (1-2 muscles), [id number] (3+ muscles)- Dry Needling, Patient/Family education, Balance training, Stair training, Taping, Joint mobilization, Spinal mobilization, Scar mobilization, Cryotherapy, Moist heat, and Biofeedback     Allante Beane L, PT 02/17/2024, 2:51 PM  Delon Pinal, PT,DPT 02/17/24 2:51 PM Phone: [phone] Fax: [phone]

## 2024-02-24 ENCOUNTER — Ambulatory Visit

## 2024-02-24 ENCOUNTER — Other Ambulatory Visit: Payer: Self-pay

## 2024-02-24 DIAGNOSIS — M6281 Muscle weakness (generalized): Secondary | ICD-10-CM

## 2024-02-24 DIAGNOSIS — R2689 Other abnormalities of gait and mobility: Secondary | ICD-10-CM

## 2024-02-24 DIAGNOSIS — R293 Abnormal posture: Secondary | ICD-10-CM

## 2024-02-24 DIAGNOSIS — Z87448 Personal history of other diseases of urinary system: Secondary | ICD-10-CM

## 2024-02-24 DIAGNOSIS — N3946 Mixed incontinence: Secondary | ICD-10-CM

## 2024-02-24 NOTE — Therapy (Signed)
 OUTPATIENT PHYSICAL THERAPY FEMALE PELVIC TREATMENT   Patient Name: Maureen King MRN: [id number] DOB:December 31, 1955, 68 y.o., female Today's Date: 02/24/2024  END OF SESSION:  PT End of Session - 02/24/24 1320     Visit Number 3    Number of Visits 9    Date for Recertification  04/08/24    Authorization Type [employer]    Progress Note Due on Visit 10    PT Start Time 1316    PT Stop Time 1356    PT Time Calculation (min) 40 min    Activity Tolerance Patient tolerated treatment well    Behavior During Therapy WFL for tasks assessed/performed          Past Medical History:  Diagnosis Date   Anxiety    Depression    GERD (gastroesophageal reflux disease)    Insomnia    Obesity    Palpitations    Past Surgical History:  Procedure Laterality Date   ABDOMINAL HYSTERECTOMY  08/29/20   COLONOSCOPY     COLPOSCOPY N/A 05/30/2020   Procedure: COLPOSCOPY;  Surgeon: Victor Claudell SAUNDERS, MD;  Location: [facility];  Service: Gynecology;  Laterality: N/A;   CYSTOSCOPY N/A 08/29/2020   Procedure: CYSTOSCOPY;  Surgeon: Victor Claudell SAUNDERS, MD;  Location: [facility];  Service: Gynecology;  Laterality: N/A;   HYSTEROSCOPY WITH D & C N/A 05/30/2020   Procedure: DILATATION AND CURETTAGE /HYSTEROSCOPY;  Surgeon: Victor Claudell SAUNDERS, MD;  Location: [facility];  Service: Gynecology;  Laterality: N/A;   NO PAST SURGERIES     ROBOTIC ASSISTED LAPAROSCOPIC HYSTERECTOMY AND SALPINGECTOMY Bilateral 08/29/2020   Procedure: XI ROBOTIC ASSISTED LAPAROSCOPIC HYSTERECTOMY AND SALPINGECTOMY, BILATERAL OOPHORECTOMY;  Surgeon: Victor Claudell SAUNDERS, MD;  Location: [facility];  Service: Gynecology;  Laterality: Bilateral;   Patient Active Problem List   Diagnosis Date Noted   OSA (obstructive sleep apnea) 02/03/2024   Nocturnal enuresis 11/02/2023   Osteopenia of neck of femur 06/22/2022   Advance directive discussed with patient 02/27/2022   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   Bursitis of  both hips 08/11/2021   Osteoarthritis of knees, bilateral 08/11/2021   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   Endometrial hyperplasia without atypia    Endometrial polyp    Bradycardia 03/08/2017   Morbid obesity (HCC) 02/12/2015   Depression 02/12/2015   Insomnia 02/12/2015   GERD (gastroesophageal reflux disease) 02/12/2015    PCP: Duwaine Louder, DO  REFERRING PROVIDER: Duwaine Louder, DO  REFERRING DIAG: Nocturnal enuresis  THERAPY DIAG:  Muscle weakness (generalized)  Abnormal posture  History of nocturnal enuresis  Other abnormalities of gait and mobility  Urinary incontinence, mixed  Rationale for Evaluation and Treatment: Rehabilitation  ONSET DATE: 11/02/23 referral date  SUBJECTIVE:  SUBJECTIVE STATEMENT: Pt reported she has been busy as she's in the process of selling rental house, she's done HEP the last few days and intermittently since last visit.   EVAL:  URINARY FUNCTION: pt can go up to six hours in b/t voids, she drinks water until she goes to bed 9:30pm. She gets up one time/night (on average). Pt denied chronic UTIs or yeast infection, and no pain. Strong stream and fully emptying. Wakes up damp from urine but not gushing. BOWEL FUNCTION: at least once/day. 50% of the time types 3 and 4 and then 50% of the time softer stools. No fiber or laxatives. Pt denied fecal leakage, pain, or hemorrhoids. Able to fully empty.  CORE STABILITY: hx of laparoscopic hysterectomy, no other ab. Surgeries. Has LBP if she does a lot of work outside, feels like core is weak. Pt reported bad fall when in her late 30s on icy steps and fell directly on buttocks/tailbone. Pt denied MVAs or accidents. Does have hx of OA, osteopenia, and bursitis (B).  SEXUAL FUNCTION: pt reported dryness with  tampon use (when she had a period), pt denied painful OBGYN or intercourse. Pt denied difficulty with climaxing. Pt reported dryness intermittently s/p hysterectomy.   Fluid intake: starts with coffee (12 oz./day), drinks approx. 4-5, 16.9 oz. bottles A day, sometimes unsweetened or half and half tea, diet coke if she goes out (notices soft drinks incr. Nocturnal enuresis), rarely alcohol  PAIN:  Are you having pain? No 02/24/24 When she has back pain: NPRS scale: 3/10, achy and nagging after working in the yard. Resting and stretching helps.   PRECAUTIONS: None  RED FLAGS: None   WEIGHT BEARING RESTRICTIONS: No   FALLS:  Has patient fallen in last 6 months? No but did fall last November.   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  ACTIVITY LEVEL : likes to work in her yard, crafts, sew, helps friends decorate, hang out with friends  PLOF: Independent  PATIENT GOALS: improve core strength to be able to go to silver  sneakers and tolerate it to be able to travel, incr. Endurance, stop leaking  PERTINENT HISTORY:  Depression, obesity, mild OSA, GERD, bursitis of both hips, OA of knees (B), chondromalacia patellae, R achilles tendonopathy but had surgery (steps are problematic), osteopenia of neck of femur, endometrial polyp, full hysterectomy 2022 Sexual abuse: No  BOWEL MOVEMENT: Pain with bowel movement: No Type of bowel movement:Frequency daily Fully empty rectum: Yes:   Leakage: No Pads: No Fiber supplement/laxative No  URINATION: Pain with urination: No Fully empty bladder: Yes:   Stream: Strong Urgency: Yes  Frequency: every 2-4 hours, up to 6 hours Leakage: Walking to the bathroom and Coughingif bladder is full. Pads: No but sometimes will wear one if she knows a bathroom won't be close by.  INTERCOURSE:  Ability to have vaginal penetration No  Pain with intercourse: none DrynessYes at times Climax:  yes Marinoff Scale: 0/3   PREGNANCY: Number of pregnancies: 0 Vaginal deliveries 0 C-section deliveries 0 Currently pregnant No  PROLAPSE: None   OBJECTIVE:  Note: Objective measures were completed at Evaluation unless otherwise noted.   COGNITION: Overall cognitive status: Within functional limits for tasks assessed     SENSATION: Light touch: Appears intact   GAIT: Assistive device utilized: None Comments: wide BOS, decr. Trunk rot  POSTURE: rounded shoulders, decreased thoracic kyphosis, and posterior pelvic tilt   LUMBARAROM/PROM: all limited by approx. 25%  A/PROM A/PROM  eval  Flexion   Extension   Right lateral flexion   Left lateral flexion   Right rotation   Left rotation    (Blank rows = not tested)  LOWER EXTREMITY ROM: all WNL except for limited B hip IR.  Active ROM Right eval Left eval  Hip flexion    Hip extension    Hip abduction    Hip adduction    Hip internal rotation    Hip external rotation    Knee flexion    Knee extension    Ankle dorsiflexion    Ankle plantarflexion    Ankle inversion    Ankle eversion     (Blank rows = not tested)  LOWER EXTREMITY MMT:  MMT Right eval Left eval  Hip flexion 4 4  Hip extension    Hip abduction 2+ 2+  Hip adduction 3+ 4-  Hip internal rotation 3+ 3+  Hip external rotation 4 4  Knee flexion 4 4  Knee extension 5 5  Ankle dorsiflexion 5 with soreness at surgical site 5  Ankle plantarflexion    Ankle inversion    Ankle eversion     (Blank rows = not tested) PALPATION:   General: no TTP during palpation of spine and SIJ in standing 9/18: no LLD noted. Incr. Infrasternal angle noted, no DR. No pain with palpation over spine or paraspinals but spine hypomobility noted.    PELVIC MMT:   MMT eval  Vaginal   Internal Anal Sphincter   External Anal Sphincter   Puborectalis   Diastasis Recti   (Blank rows = not tested)        TONE:   PROLAPSE:   TODAY'S TREATMENT:                                                                                                                               DATE: 02/24/24    NMR: Access Code: [email or web address] URL: [email or web address] Date: 02/24/2024 Prepared by: Delon Pinal  Exercises - Supine Angels  - 1 x daily - 7 x weekly - 1 sets - 10 reps cues to keep hands behind head vs. In front of head - Sidelying Open Book  - 1 x daily - 7 x weekly - 1 sets - 10 reps cues to keep elbow bent. - Sidelying Diaphragmatic Breathing  - 1 x daily - 7 x weekly - 1 sets - 5 reps - Plank with Thoracic Rotation on Counter  - 1 x daily - 7 x weekly - 1 sets - 10 reps Cues and demo for proper technique. S for safety. No pain reported.  MANUAL THERAPY: PT assessed hysterectomy (ab) scars and R heel spur and Achille's repair scars with incr. Adhesions noted, performed STM and pt amb. Afterwards and reported decr. Tension in R ankle.  SELF CARE: PATIENT EDUCATION:  Education details: PT educated pt on foot/PFM relationship and how IAP can be impacted by abdominal surgeries  and how scar mobilization can assist.  Person educated: Patient Education method: Explanation, Demonstration, and Handouts Education comprehension: verbalized understanding and needs further education  HOME EXERCISE PROGRAM: Not yet established  ASSESSMENT:  CLINICAL IMPRESSION: Skilled session focused on reviewing HEP and providing cues for proper technique and educating pt on scar mobilization to decr. Adhesions. Pt quickly progressed to performing IND after cues.  The following impairments continue to be noted: limited ROM, back pain, postural dysfunction, decr. Strength, nocturnal enuresis, impaired balance, and mixed UI. Pt would continue to benefit from skilled PT to improve safety and decr. Pain during all ADLs.   OBJECTIVE IMPAIRMENTS: Abnormal gait, decreased balance, decreased coordination, decreased endurance, decreased mobility, decreased ROM,  decreased strength, hypomobility, increased fascial restrictions, impaired flexibility, postural dysfunction, obesity, and pain.   ACTIVITY LIMITATIONS: carrying, lifting, bending, standing, squatting, sleeping, transfers, continence, toileting, locomotion level, and caring for others  PARTICIPATION LIMITATIONS: meal prep, cleaning, laundry, interpersonal relationship, shopping, and community activity  PERSONAL FACTORS: Age, Past/current experiences, and 3+ comorbidities: see above are also affecting patient's functional outcome.   REHAB POTENTIAL: Good  CLINICAL DECISION MAKING: Stable/uncomplicated  EVALUATION COMPLEXITY: Low   GOALS: Goals reviewed with patient? Yes  SHORT TERM GOALS: Target date: for all STGs: 03/07/24  Pt will be IND in HEP to improve pain, strength, coordination. Baseline: no HEP Goal status: INITIAL  2.  Finish exam and write goals as indicated. Baseline: limited by time constraints Goal status: MET  3.  Pt will demo proper toileting posture to fully empty bladder and reduce straining during bowel movement. Baseline: unable to demo Goal status: INITIAL  4.  Pt will demonstrated improved relaxation and contraction of PFM with coordination of breath to reduce urinary leakage to </=once/week. Baseline: mixed UI intermittently Goal status: INITIAL   LONG TERM GOALS: Target date: for all LTGs: 04/04/24  Pt will demonstrated improved relaxation and contraction of PFM with coordination of breath to reduce nocturnal enuresis to </=once/week. Baseline: nightly, damp but not gushing urine when she awakes Goal status: INITIAL  2.  Pt will join a gym to maximize gains made during PT to improve QOL, reduce leakage, and continue to improve strength. Baseline: no gym Goal status: INITIAL  3.  Pt will be able to amb. 1000' over uneven and even terrain without leaking to improve endurance in prep for travel. Baseline: mixed UI Goal status: INITIAL  4. Pt will  be IND in traversing 4 steps without an incr. In pain or leakage to safely traverse steps in the community.   Baseline: impaired balance and back/knee/hip/foot pain traversing stairs Goal status: INITIAL  PLAN: Review HEP prn, internal assessment prn, LE, hip and core strengthening.    PT FREQUENCY: 1x/week  PT DURATION: 8 weeks  PLANNED INTERVENTIONS: 97164- PT Re-evaluation, 97110-Therapeutic exercises, 97530- Therapeutic activity, 97112- Neuromuscular re-education, 97535- Self Care, 02859- Manual therapy, [phone]- Gait training, [phone] (1-2 muscles), 20561 (3+ muscles)- Dry Needling, Patient/Family education, Balance training, Stair training, Taping, Joint mobilization, Spinal mobilization, Scar mobilization, Cryotherapy, Moist heat, and Biofeedback     Amalya Salmons L, PT 02/24/2024, 1:21 PM  Delon Pinal, PT,DPT 02/24/24 1:21 PM Phone: [phone] Fax: [phone]

## 2024-03-02 ENCOUNTER — Other Ambulatory Visit: Payer: Self-pay

## 2024-03-02 ENCOUNTER — Ambulatory Visit: Attending: Family Medicine

## 2024-03-02 DIAGNOSIS — N3946 Mixed incontinence: Secondary | ICD-10-CM | POA: Insufficient documentation

## 2024-03-02 DIAGNOSIS — R293 Abnormal posture: Secondary | ICD-10-CM | POA: Insufficient documentation

## 2024-03-02 DIAGNOSIS — Z87448 Personal history of other diseases of urinary system: Secondary | ICD-10-CM | POA: Insufficient documentation

## 2024-03-02 DIAGNOSIS — M6281 Muscle weakness (generalized): Secondary | ICD-10-CM | POA: Insufficient documentation

## 2024-03-02 DIAGNOSIS — R2689 Other abnormalities of gait and mobility: Secondary | ICD-10-CM | POA: Insufficient documentation

## 2024-03-02 NOTE — Therapy (Signed)
 OUTPATIENT PHYSICAL THERAPY FEMALE PELVIC TREATMENT   Patient Name: Maureen King MRN: [id number] DOB:[birth year], 68 y.o., female Today's Date: 03/02/2024  END OF SESSION:  PT End of Session - 03/02/24 1318     Visit Number 4    Number of Visits 9    Date for Recertification  04/08/24    Authorization Type [employer]    Progress Note Due on Visit 10    PT Start Time 1316    PT Stop Time 1355    PT Time Calculation (min) 39 min    Activity Tolerance Patient tolerated treatment well    Behavior During Therapy WFL for tasks assessed/performed          Past Medical History:  Diagnosis Date   Anxiety    Depression    GERD (gastroesophageal reflux disease)    Insomnia    Obesity    Palpitations    Past Surgical History:  Procedure Laterality Date   ABDOMINAL HYSTERECTOMY  08/29/20   COLONOSCOPY     COLPOSCOPY N/A 05/30/2020   Procedure: COLPOSCOPY;  Surgeon: Victor Claudell SAUNDERS, MD;  Location: [facility];  Service: Gynecology;  Laterality: N/A;   CYSTOSCOPY N/A 08/29/2020   Procedure: CYSTOSCOPY;  Surgeon: Victor Claudell SAUNDERS, MD;  Location: [facility];  Service: Gynecology;  Laterality: N/A;   HYSTEROSCOPY WITH D & C N/A 05/30/2020   Procedure: DILATATION AND CURETTAGE /HYSTEROSCOPY;  Surgeon: Victor Claudell SAUNDERS, MD;  Location: [facility];  Service: Gynecology;  Laterality: N/A;   NO PAST SURGERIES     ROBOTIC ASSISTED LAPAROSCOPIC HYSTERECTOMY AND SALPINGECTOMY Bilateral 08/29/2020   Procedure: XI ROBOTIC ASSISTED LAPAROSCOPIC HYSTERECTOMY AND SALPINGECTOMY, BILATERAL OOPHORECTOMY;  Surgeon: Victor Claudell SAUNDERS, MD;  Location: [facility];  Service: Gynecology;  Laterality: Bilateral;   Patient Active Problem List   Diagnosis Date Noted   OSA (obstructive sleep apnea) 02/03/2024   Nocturnal enuresis 11/02/2023   Osteopenia of neck of femur 06/22/2022   Advance directive discussed with patient 02/27/2022   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   Bursitis of  both hips 08/11/2021   Osteoarthritis of knees, bilateral 08/11/2021   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   Endometrial hyperplasia without atypia    Endometrial polyp    Bradycardia 03/08/2017   Morbid obesity (HCC) 02/12/2015   Depression 02/12/2015   Insomnia 02/12/2015   GERD (gastroesophageal reflux disease) 02/12/2015    PCP: Duwaine Louder, DO  REFERRING PROVIDER: Duwaine Louder, DO  REFERRING DIAG: Nocturnal enuresis  THERAPY DIAG:  Muscle weakness (generalized)  Abnormal posture  History of nocturnal enuresis  Other abnormalities of gait and mobility  Urinary incontinence, mixed  Rationale for Evaluation and Treatment: Rehabilitation  ONSET DATE: 11/02/23 referral date  SUBJECTIVE:  SUBJECTIVE STATEMENT: Pt reported she states HEP have been going well. She feels a little bit stronger and she's able to walk a little faster upon walking to PT appt and not limping. Pt reported her scar mobilizations are going well and scars feel better. Pt reported leakage at night has been better and more conscious about fluid intake at night.   EVAL:  URINARY FUNCTION: pt can go up to six hours in b/t voids, she drinks water until she goes to bed 9:30pm. She gets up one time/night (on average). Pt denied chronic UTIs or yeast infection, and no pain. Strong stream and fully emptying. Wakes up damp from urine but not gushing. BOWEL FUNCTION: at least once/day. 50% of the time types 3 and 4 and then 50% of the time softer stools. No fiber or laxatives. Pt denied fecal leakage, pain, or hemorrhoids. Able to fully empty.  CORE STABILITY: hx of laparoscopic hysterectomy, no other ab. Surgeries. Has LBP if she does a lot of work outside, feels like core is weak. Pt reported bad fall when in her late 30s  on icy steps and fell directly on buttocks/tailbone. Pt denied MVAs or accidents. Does have hx of OA, osteopenia, and bursitis (B).  SEXUAL FUNCTION: pt reported dryness with tampon use (when she had a period), pt denied painful OBGYN or intercourse. Pt denied difficulty with climaxing. Pt reported dryness intermittently s/p hysterectomy.   Fluid intake: starts with coffee (12 oz./day), drinks approx. 4-5, 16.9 oz. bottles A day, sometimes unsweetened or half and half tea, diet coke if she goes out (notices soft drinks incr. Nocturnal enuresis), rarely alcohol  PAIN:  Are you having pain? No 03/02/24 When she has back pain: NPRS scale: 3/10, achy and nagging after working in the yard. Resting and stretching helps.   PRECAUTIONS: None  RED FLAGS: None   WEIGHT BEARING RESTRICTIONS: No   FALLS:  Has patient fallen in last 6 months? No but did fall last November.   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  ACTIVITY LEVEL : likes to work in her yard, crafts, sew, helps friends decorate, hang out with friends  PLOF: Independent  PATIENT GOALS: improve core strength to be able to go to silver  sneakers and tolerate it to be able to travel, incr. Endurance, stop leaking  PERTINENT HISTORY:  Depression, obesity, mild OSA, GERD, bursitis of both hips, OA of knees (B), chondromalacia patellae, R achilles tendonopathy but had surgery (steps are problematic), osteopenia of neck of femur, endometrial polyp, full hysterectomy 2022 Sexual abuse: No  BOWEL MOVEMENT: Pain with bowel movement: No Type of bowel movement:Frequency daily Fully empty rectum: Yes:   Leakage: No Pads: No Fiber supplement/laxative No  URINATION: Pain with urination: No Fully empty bladder: Yes:   Stream: Strong Urgency: Yes  Frequency: every 2-4 hours, up to 6 hours Leakage: Walking to the bathroom and Coughingif bladder is full. Pads: No but sometimes will  wear one if she knows a bathroom won't be close by.  INTERCOURSE:  Ability to have vaginal penetration No  Pain with intercourse: none DrynessYes at times Climax: yes Marinoff Scale: 0/3   PREGNANCY: Number of pregnancies: 0 Vaginal deliveries 0 C-section deliveries 0 Currently pregnant No  PROLAPSE: None   OBJECTIVE:  Note: Objective measures were completed at Evaluation unless otherwise noted.   COGNITION: Overall cognitive status: Within functional limits for tasks assessed     SENSATION: Light touch: Appears intact  GAIT: Assistive device utilized: None Comments: wide BOS, decr. Trunk rot  POSTURE: rounded shoulders, decreased thoracic kyphosis, and posterior pelvic tilt   LUMBARAROM/PROM: all limited by approx. 25%   A/PROM A/PROM  eval  Flexion   Extension   Right lateral flexion   Left lateral flexion   Right rotation   Left rotation    (Blank rows = not tested)  LOWER EXTREMITY ROM: all WNL except for limited B hip IR.  Active ROM Right eval Left eval  Hip flexion    Hip extension    Hip abduction    Hip adduction    Hip internal rotation    Hip external rotation    Knee flexion    Knee extension    Ankle dorsiflexion    Ankle plantarflexion    Ankle inversion    Ankle eversion     (Blank rows = not tested)  LOWER EXTREMITY MMT:  MMT Right eval Left eval  Hip flexion 4 4  Hip extension    Hip abduction 2+ 2+  Hip adduction 3+ 4-  Hip internal rotation 3+ 3+  Hip external rotation 4 4  Knee flexion 4 4  Knee extension 5 5  Ankle dorsiflexion 5 with soreness at surgical site 5  Ankle plantarflexion    Ankle inversion    Ankle eversion     (Blank rows = not tested) PALPATION:   General: no TTP during palpation of spine and SIJ in standing 9/18: no LLD noted. Incr. Infrasternal angle noted, no DR. No pain with palpation over spine or paraspinals but spine hypomobility noted.    PELVIC MMT:   MMT eval  Vaginal    Internal Anal Sphincter   External Anal Sphincter   Puborectalis   Diastasis Recti   (Blank rows = not tested)        TONE:   PROLAPSE:   TODAY'S TREATMENT:                                                                                                                              DATE: 03/02/24    NMR: Access Code: [email or web address] URL: [email or web address] Date: 03/02/2024 Prepared by: Delon Pinal  Exercises - Supine Angels  - 1 x daily - 7 x weekly - 1 sets - 10 reps - Sidelying Open Book  - 1 x daily - 7 x weekly - 1 sets - 10 reps - Sidelying Diaphragmatic Breathing  - 1 x daily - 7 x weekly - 1 sets - 5 reps - Plank with Thoracic Rotation on Counter  - 1 x daily - 7 x weekly - 1 sets - 10 reps - Dead Bug  - 1 x daily - 3 x weekly - 3 sets - 10 reps - Beginner Bridge  - 1 x daily - 3 x weekly - 3 sets - 10 reps - Clamshell with and without Resistance  - 1 x daily - 3 x weekly -  3 sets - 10 reps  Cues and demo for proper technique. S for safety. No pain reported.   SELF CARE: PATIENT EDUCATION:  Education details: PT educated pt on progressed HEP to integrate strengthening into routine. Person educated: Patient Education method: Explanation, Demonstration, and Handouts Education comprehension: verbalized understanding and needs further education  HOME EXERCISE PROGRAM: [email or web address] [employer]  ASSESSMENT:  CLINICAL IMPRESSION: Skilled session focused on progressing HEP to include strengthening of hips, deep core and LE to reduce force on pelvic floor to reduce leakage. Pt required cues and demo but quickly progressed to performing IND. The following impairments continue to be noted: limited ROM, back pain, postural dysfunction, decr. Strength, nocturnal enuresis, impaired balance, and mixed UI. Pt would continue to benefit from skilled PT to improve safety and decr. Pain during all ADLs.   OBJECTIVE IMPAIRMENTS: Abnormal gait, decreased balance, decreased  coordination, decreased endurance, decreased mobility, decreased ROM, decreased strength, hypomobility, increased fascial restrictions, impaired flexibility, postural dysfunction, obesity, and pain.   ACTIVITY LIMITATIONS: carrying, lifting, bending, standing, squatting, sleeping, transfers, continence, toileting, locomotion level, and caring for others  PARTICIPATION LIMITATIONS: meal prep, cleaning, laundry, interpersonal relationship, shopping, and community activity  PERSONAL FACTORS: Age, Past/current experiences, and 3+ comorbidities: see above are also affecting patient's functional outcome.   REHAB POTENTIAL: Good  CLINICAL DECISION MAKING: Stable/uncomplicated  EVALUATION COMPLEXITY: Low   GOALS: Goals reviewed with patient? Yes  SHORT TERM GOALS: Target date: for all STGs: 03/07/24  Pt will be IND in HEP to improve pain, strength, coordination. Baseline: no HEP Goal status: INITIAL  2.  Finish exam and write goals as indicated. Baseline: limited by time constraints Goal status: MET  3.  Pt will demo proper toileting posture to fully empty bladder and reduce straining during bowel movement. Baseline: unable to demo Goal status: INITIAL  4.  Pt will demonstrated improved relaxation and contraction of PFM with coordination of breath to reduce urinary leakage to </=once/week. Baseline: mixed UI intermittently Goal status: INITIAL   LONG TERM GOALS: Target date: for all LTGs: 04/04/24  Pt will demonstrated improved relaxation and contraction of PFM with coordination of breath to reduce nocturnal enuresis to </=once/week. Baseline: nightly, damp but not gushing urine when she awakes Goal status: INITIAL  2.  Pt will join a gym to maximize gains made during PT to improve QOL, reduce leakage, and continue to improve strength. Baseline: no gym Goal status: INITIAL  3.  Pt will be able to amb. 1000' over uneven and even terrain without leaking to improve endurance in prep  for travel. Baseline: mixed UI Goal status: INITIAL  4. Pt will be IND in traversing 4 steps without an incr. In pain or leakage to safely traverse steps in the community.   Baseline: impaired balance and back/knee/hip/foot pain traversing stairs Goal status: INITIAL  PLAN:  HEP prn, internal assessment prn, LE, hip and core strengthening.    PT FREQUENCY: 1x/week  PT DURATION: 8 weeks  PLANNED INTERVENTIONS: 97164- PT Re-evaluation, 97110-Therapeutic exercises, 97530- Therapeutic activity, 97112- Neuromuscular re-education, 97535- Self Care, 02859- Manual therapy, [phone]- Gait training, [phone] (1-2 muscles), 20561 (3+ muscles)- Dry Needling, Patient/Family education, Balance training, Stair training, Taping, Joint mobilization, Spinal mobilization, Scar mobilization, Cryotherapy, Moist heat, and Biofeedback     Sreekar Broyhill L, PT 03/02/2024, 1:18 PM  Delon Pinal, PT,DPT 03/02/24 1:18 PM Phone: [phone] Fax: [phone]

## 2024-03-03 ENCOUNTER — Encounter: Payer: Self-pay | Admitting: Family Medicine

## 2024-03-03 ENCOUNTER — Telehealth (INDEPENDENT_AMBULATORY_CARE_PROVIDER_SITE_OTHER): Admitting: Family Medicine

## 2024-03-03 VITALS — Ht 60.5 in | Wt 222.0 lb

## 2024-03-03 DIAGNOSIS — G4733 Obstructive sleep apnea (adult) (pediatric): Secondary | ICD-10-CM | POA: Diagnosis not present

## 2024-03-03 MED ORDER — TIRZEPATIDE-WEIGHT MANAGEMENT 5 MG/0.5ML ~~LOC~~ SOLN: 5.0000 mg | SUBCUTANEOUS | 0 refills | Status: DC

## 2024-03-03 MED ORDER — TIRZEPATIDE-WEIGHT MANAGEMENT 2.5 MG/0.5ML ~~LOC~~ SOLN: 2.5000 mg | SUBCUTANEOUS | 0 refills | Status: DC

## 2024-03-03 NOTE — Assessment & Plan Note (Signed)
 Zepbound  not covered by her insurance. Would like to go out of pocket. Rx sent to [facility]. Call with any concerns. Follow up 6-8 weeks.

## 2024-03-03 NOTE — Assessment & Plan Note (Signed)
 AHI 11.2. Zepbound  not covered by her insurance with AHI less than 15. Would like to go out of pocket. Rx sent to [facility]. Call with any concerns. Follow up 6-8 weeks.

## 2024-03-03 NOTE — Progress Notes (Signed)
 Ht 5' 0.5 (1.537 m)   Wt 222 lb (100.7 kg)   LMP  (LMP Unknown)   BMI 42.64 kg/m    Subjective:    Patient ID: Maureen King, female    DOB: Nov 19, 1955, 68 y.o.   MRN: [id number]  HPI: Maureen King is a 68 y.o. female  Chief Complaint  Patient presents with   Sleep Apnea   OBESITY Duration: chronic Previous attempts at weight loss: yes, diet, exercise, weight watchers Complications of obesity: OSA, depression, arthritis, GERD,  Peak weight: 222 (current) Weight loss goal: to be healthy Weight loss to date: none Requesting obesity pharmacotherapy: yes Current weight loss supplements/medications: no Previous weight loss supplements/meds: no  Relevant past medical, surgical, family and social history reviewed and updated as indicated. Interim medical history since our last visit reviewed. Allergies and medications reviewed and updated.  Review of Systems  Constitutional: Negative.   Respiratory: Negative.    Cardiovascular: Negative.   Neurological: Negative.   Psychiatric/Behavioral: Negative.      Per HPI unless specifically indicated above     Objective:    Ht 5' 0.5 (1.537 m)   Wt 222 lb (100.7 kg)   LMP  (LMP Unknown)   BMI 42.64 kg/m   Wt Readings from Last 3 Encounters:  03/03/24 222 lb (100.7 kg)  02/03/24 222 lb (100.7 kg)  12/15/23 221 lb (100.2 kg)    Physical Exam Vitals and nursing note reviewed.  Constitutional:      General: She is not in acute distress.    Appearance: Normal appearance. She is obese. She is not ill-appearing, toxic-appearing or diaphoretic.  HENT:     Head: Normocephalic and atraumatic.     Right Ear: External ear normal.     Left Ear: External ear normal.     Nose: Nose normal.     Mouth/Throat:     Mouth: Mucous membranes are moist.     Pharynx: Oropharynx is clear.  Eyes:     General: No scleral icterus.       Right eye: No discharge.        Left eye: No discharge.     Conjunctiva/sclera:  Conjunctivae normal.     Pupils: Pupils are equal, round, and reactive to light.  Pulmonary:     Effort: Pulmonary effort is normal. No respiratory distress.     Comments: Speaking in full sentences Musculoskeletal:        General: Normal range of motion.     Cervical back: Normal range of motion.  Skin:    Coloration: Skin is not jaundiced or pale.     Findings: No bruising, erythema, lesion or rash.  Neurological:     Mental Status: She is alert and oriented to person, place, and time. Mental status is at baseline.  Psychiatric:        Mood and Affect: Mood normal.        Behavior: Behavior normal.        Thought Content: Thought content normal.        Judgment: Judgment normal.     Results for orders placed or performed in visit on 05/04/23  CBC with Differential/Platelet   Collection Time: 05/04/23  9:55 AM  Result Value Ref Range   WBC 5.9 3.4 - 10.8 x10E3/uL   RBC 4.49 3.77 - 5.28 x10E6/uL   Hemoglobin 13.2 11.1 - 15.9 g/dL   Hematocrit 57.9 65.9 - 46.6 %   MCV 94 79 - 97 fL  MCH 29.4 26.6 - 33.0 pg   MCHC 31.4 (L) 31.5 - 35.7 g/dL   RDW 87.3 88.2 - 84.5 %   Platelets 277 150 - 450 x10E3/uL   Neutrophils 75 Not Estab. %   Lymphs 14 Not Estab. %   Monocytes 6 Not Estab. %   Eos 4 Not Estab. %   Basos 1 Not Estab. %   Neutrophils Absolute 4.4 1.4 - 7.0 x10E3/uL   Lymphocytes Absolute 0.9 0.7 - 3.1 x10E3/uL   Monocytes Absolute 0.4 0.1 - 0.9 x10E3/uL   EOS (ABSOLUTE) 0.3 0.0 - 0.4 x10E3/uL   Basophils Absolute 0.0 0.0 - 0.2 x10E3/uL   Immature Granulocytes 0 Not Estab. %   Immature Grans (Abs) 0.0 0.0 - 0.1 x10E3/uL  Comprehensive metabolic panel   Collection Time: 05/04/23  9:55 AM  Result Value Ref Range   Glucose 88 70 - 99 mg/dL   BUN 17 8 - 27 mg/dL   Creatinine, Ser 9.33 0.57 - 1.00 mg/dL   eGFR 97 >40 fO/fpw/8.26   BUN/Creatinine Ratio 26 12 - 28   Sodium 140 134 - 144 mmol/L   Potassium 4.3 3.5 - 5.2 mmol/L   Chloride 104 96 - 106 mmol/L   CO2 26  20 - 29 mmol/L   Calcium 9.4 8.7 - 10.3 mg/dL   Total Protein 6.0 6.0 - 8.5 g/dL   Albumin 3.9 3.9 - 4.9 g/dL   Globulin, Total 2.1 1.5 - 4.5 g/dL   Bilirubin Total 0.3 0.0 - 1.2 mg/dL   Alkaline Phosphatase 111 44 - 121 IU/L   AST 21 0 - 40 IU/L   ALT 21 0 - 32 IU/L  Lipid Panel w/o Chol/HDL Ratio   Collection Time: 05/04/23  9:55 AM  Result Value Ref Range   Cholesterol, Total 226 (H) 100 - 199 mg/dL   Triglycerides 811 (H) 0 - 149 mg/dL   HDL 77 >60 mg/dL   VLDL Cholesterol Cal 32 5 - 40 mg/dL   LDL Chol Calc (NIH) 882 (H) 0 - 99 mg/dL  TSH   Collection Time: 05/04/23  9:55 AM  Result Value Ref Range   TSH 1.330 0.450 - 4.500 uIU/mL      Assessment & Plan:   Problem List Items Addressed This Visit       Respiratory   OSA (obstructive sleep apnea) - Primary   AHI 11.2. Zepbound  not covered by her insurance with AHI less than 15. Would like to go out of pocket. Rx sent to [facility]. Call with any concerns. Follow up 6-8 weeks.         Other   Morbid obesity (HCC)   Zepbound  not covered by her insurance. Would like to go out of pocket. Rx sent to [facility]. Call with any concerns. Follow up 6-8 weeks.       Relevant Medications   tirzepatide  (ZEPBOUND ) 2.5 MG/0.5ML injection vial   tirzepatide  5 MG/0.5ML injection vial (Start on 03/24/2024)     Follow up plan: Return 6-8 weeks.    This visit was completed via video visit through MyChart due to the restrictions of the COVID-19 pandemic. All issues as above were discussed and addressed. Physical exam was done as above through visual confirmation on video through MyChart. If it was felt that the patient should be evaluated in the office, they were directed there. The patient verbally consented to this visit. Location of the patient: parking lot Location of the provider: work Those involved with this call:  Provider: Duwaine Louder, DO CMA: York Fogo, CMA, Front Desk/Registration: Claretta Maiden  Time spent on  call: 15 minutes with patient face to face via video conference. More than 50% of this time was spent in counseling and coordination of care. 23 minutes total spent in review of patient's record and preparation of their chart.

## 2024-03-06 ENCOUNTER — Encounter

## 2024-03-07 ENCOUNTER — Other Ambulatory Visit: Payer: Self-pay

## 2024-03-13 ENCOUNTER — Ambulatory Visit

## 2024-03-13 ENCOUNTER — Other Ambulatory Visit: Payer: Self-pay

## 2024-03-13 DIAGNOSIS — M6281 Muscle weakness (generalized): Secondary | ICD-10-CM | POA: Diagnosis not present

## 2024-03-13 DIAGNOSIS — R2689 Other abnormalities of gait and mobility: Secondary | ICD-10-CM

## 2024-03-13 DIAGNOSIS — N3946 Mixed incontinence: Secondary | ICD-10-CM

## 2024-03-13 DIAGNOSIS — R293 Abnormal posture: Secondary | ICD-10-CM

## 2024-03-13 DIAGNOSIS — Z87448 Personal history of other diseases of urinary system: Secondary | ICD-10-CM

## 2024-03-13 NOTE — Therapy (Signed)
 OUTPATIENT PHYSICAL THERAPY FEMALE PELVIC TREATMENT   Patient Name: Maureen King MRN: [id number] DOB:March 04, 1956, 68 y.o., female Today's Date: 03/13/2024  END OF SESSION:  PT End of Session - 03/13/24 1143     Visit Number 5    Number of Visits 9    Date for Recertification  04/08/24    Authorization Type [employer]    Progress Note Due on Visit 10    PT Start Time 1141    PT Stop Time 1220    PT Time Calculation (min) 39 min    Activity Tolerance Patient tolerated treatment well    Behavior During Therapy WFL for tasks assessed/performed          Past Medical History:  Diagnosis Date   Anxiety    Depression    GERD (gastroesophageal reflux disease)    Insomnia    Obesity    Palpitations    Past Surgical History:  Procedure Laterality Date   ABDOMINAL HYSTERECTOMY  08/29/20   COLONOSCOPY     COLPOSCOPY N/A 05/30/2020   Procedure: COLPOSCOPY;  Surgeon: Victor Claudell SAUNDERS, MD;  Location: [facility];  Service: Gynecology;  Laterality: N/A;   CYSTOSCOPY N/A 08/29/2020   Procedure: CYSTOSCOPY;  Surgeon: Victor Claudell SAUNDERS, MD;  Location: [facility];  Service: Gynecology;  Laterality: N/A;   HYSTEROSCOPY WITH D & C N/A 05/30/2020   Procedure: DILATATION AND CURETTAGE /HYSTEROSCOPY;  Surgeon: Victor Claudell SAUNDERS, MD;  Location: [facility];  Service: Gynecology;  Laterality: N/A;   NO PAST SURGERIES     ROBOTIC ASSISTED LAPAROSCOPIC HYSTERECTOMY AND SALPINGECTOMY Bilateral 08/29/2020   Procedure: XI ROBOTIC ASSISTED LAPAROSCOPIC HYSTERECTOMY AND SALPINGECTOMY, BILATERAL OOPHORECTOMY;  Surgeon: Victor Claudell SAUNDERS, MD;  Location: [facility];  Service: Gynecology;  Laterality: Bilateral;   Patient Active Problem List   Diagnosis Date Noted   OSA (obstructive sleep apnea) 02/03/2024   Nocturnal enuresis 11/02/2023   Osteopenia of neck of femur 06/22/2022   Advance directive discussed with patient 02/27/2022   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   Bursitis of  both hips 08/11/2021   Osteoarthritis of knees, bilateral 08/11/2021   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   Endometrial hyperplasia without atypia    Endometrial polyp    Bradycardia 03/08/2017   Morbid obesity (HCC) 02/12/2015   Depression 02/12/2015   Insomnia 02/12/2015   GERD (gastroesophageal reflux disease) 02/12/2015    PCP: Duwaine Louder, DO  REFERRING PROVIDER: Duwaine Louder, DO  REFERRING DIAG: Nocturnal enuresis  THERAPY DIAG:  Muscle weakness (generalized)  Abnormal posture  History of nocturnal enuresis  Other abnormalities of gait and mobility  Urinary incontinence, mixed  Rationale for Evaluation and Treatment: Rehabilitation  ONSET DATE: 11/02/23 referral date  SUBJECTIVE:  SUBJECTIVE STATEMENT: Pt reported she had a great trip to the beach last week. The bed at the beach was not great, so it hurt her back. Performed HEP intermittent while on vacation.   EVAL:  URINARY FUNCTION: pt can go up to six hours in b/t voids, she drinks water until she goes to bed 9:30pm. She gets up one time/night (on average). Pt denied chronic UTIs or yeast infection, and no pain. Strong stream and fully emptying. Wakes up damp from urine but not gushing. BOWEL FUNCTION: at least once/day. 50% of the time types 3 and 4 and then 50% of the time softer stools. No fiber or laxatives. Pt denied fecal leakage, pain, or hemorrhoids. Able to fully empty.  CORE STABILITY: hx of laparoscopic hysterectomy, no other ab. Surgeries. Has LBP if she does a lot of work outside, feels like core is weak. Pt reported bad fall when in her late 30s on icy steps and fell directly on buttocks/tailbone. Pt denied MVAs or accidents. Does have hx of OA, osteopenia, and bursitis (B).  SEXUAL FUNCTION: pt reported  dryness with tampon use (when she had a period), pt denied painful OBGYN or intercourse. Pt denied difficulty with climaxing. Pt reported dryness intermittently s/p hysterectomy.   Fluid intake: starts with coffee (12 oz./day), drinks approx. 4-5, 16.9 oz. bottles A day, sometimes unsweetened or half and half tea, diet coke if she goes out (notices soft drinks incr. Nocturnal enuresis), rarely alcohol  PAIN:  Are you having pain? No 03/13/24 When she has back pain: NPRS scale: 3/10, achy and nagging after working in the yard. Resting and stretching helps.   PRECAUTIONS: None  RED FLAGS: None   WEIGHT BEARING RESTRICTIONS: No   FALLS:  Has patient fallen in last 6 months? No but did fall last November.   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  ACTIVITY LEVEL : likes to work in her yard, crafts, sew, helps friends decorate, hang out with friends  PLOF: Independent  PATIENT GOALS: improve core strength to be able to go to silver  sneakers and tolerate it to be able to travel, incr. Endurance, stop leaking  PERTINENT HISTORY:  Depression, obesity, mild OSA, GERD, bursitis of both hips, OA of knees (B), chondromalacia patellae, R achilles tendonopathy but had surgery (steps are problematic), osteopenia of neck of femur, endometrial polyp, full hysterectomy 2022 Sexual abuse: No  BOWEL MOVEMENT: Pain with bowel movement: No Type of bowel movement:Frequency daily Fully empty rectum: Yes:   Leakage: No Pads: No Fiber supplement/laxative No  URINATION: Pain with urination: No Fully empty bladder: Yes:   Stream: Strong Urgency: Yes  Frequency: every 2-4 hours, up to 6 hours Leakage: Walking to the bathroom and Coughingif bladder is full. Pads: No but sometimes will wear one if she knows a bathroom won't be close by.  INTERCOURSE:  Ability to have vaginal penetration No  Pain with intercourse: none DrynessYes at  times Climax: yes Marinoff Scale: 0/3   PREGNANCY: Number of pregnancies: 0 Vaginal deliveries 0 C-section deliveries 0 Currently pregnant No  PROLAPSE: None   OBJECTIVE:  Note: Objective measures were completed at Evaluation unless otherwise noted.   COGNITION: Overall cognitive status: Within functional limits for tasks assessed     SENSATION: Light touch: Appears intact   GAIT: Assistive device utilized: None Comments: wide BOS, decr. Trunk rot  POSTURE: rounded shoulders, decreased thoracic kyphosis, and posterior pelvic tilt   LUMBARAROM/PROM: all limited  by approx. 25%   A/PROM A/PROM  eval  Flexion   Extension   Right lateral flexion   Left lateral flexion   Right rotation   Left rotation    (Blank rows = not tested)  LOWER EXTREMITY ROM: all WNL except for limited B hip IR.  Active ROM Right eval Left eval  Hip flexion    Hip extension    Hip abduction    Hip adduction    Hip internal rotation    Hip external rotation    Knee flexion    Knee extension    Ankle dorsiflexion    Ankle plantarflexion    Ankle inversion    Ankle eversion     (Blank rows = not tested)  LOWER EXTREMITY MMT:  MMT Right eval Left eval  Hip flexion 4 4  Hip extension    Hip abduction 2+ 2+  Hip adduction 3+ 4-  Hip internal rotation 3+ 3+  Hip external rotation 4 4  Knee flexion 4 4  Knee extension 5 5  Ankle dorsiflexion 5 with soreness at surgical site 5  Ankle plantarflexion    Ankle inversion    Ankle eversion     (Blank rows = not tested) PALPATION:   General: no TTP during palpation of spine and SIJ in standing 9/18: no LLD noted. Incr. Infrasternal angle noted, no DR. No pain with palpation over spine or paraspinals but spine hypomobility noted.    PELVIC MMT:   MMT eval  Vaginal   Internal Anal Sphincter   External Anal Sphincter   Puborectalis   Diastasis Recti   (Blank rows = not tested)        TONE:   PROLAPSE:   TODAY'S  TREATMENT:                                                                                                                              DATE: 03/13/24    NMR: Exercises Access Code: [email or web address] URL: [email or web address] Date: 03/13/2024 Prepared by: Delon Pinal  Exercises - Supine Angels  - 1 x daily - 7 x weekly - 1 sets - 10 reps - Sidelying Open Book  - 1 x daily - 7 x weekly - 1 sets - 10 reps - Sidelying Diaphragmatic Breathing  - 1 x daily - 7 x weekly - 1 sets - 5 reps - Plank with Thoracic Rotation on Counter  - 1 x daily - 7 x weekly - 1 sets - 10 reps - Beginner Bridge  - 1 x daily - 3 x weekly - 3 sets - 10 reps -dead bugs x10 reps, feet on table. - Clamshell with Resistance  - 1 x daily - 3 x weekly - 3 sets - 10 reps - Supine Shoulder Overhead Flexion with Medicine Ball  - 1 x daily - 3 x weekly - 3 sets - 10 reps - Supine 90/90 Alternating Heel Touches with Posterior Pelvic Tilt  -  1 x daily - 3 x weekly - 4 sets - 5 reps Cues and demo for proper technique. S for safety. No pain reported.   SELF CARE: PATIENT EDUCATION:  Education details: PT educated pt on progressed HEP (strengthening) and STGs progress. Also, discussed the importance of a good, supportive mattress Person educated: Patient Education method: Explanation, Demonstration, and Handouts Education comprehension: verbalized understanding and needs further education  HOME EXERCISE PROGRAM: [email or web address] [employer]  ASSESSMENT:  CLINICAL IMPRESSION: Skilled session focused on assessing STGs, pt demonstrated progress as she met all STGs and reported 45% improvement on GROC scale since starting PHPT. Pt required minimal cues and demo to quickly progress to performing new HEP IND. The following impairments continue to be noted: limited ROM, back pain, postural dysfunction, decr. Strength, nocturnal enuresis, impaired balance, and mixed UI. Pt would continue to benefit from skilled PT to improve  safety and decr. Pain during all ADLs.   OBJECTIVE IMPAIRMENTS: Abnormal gait, decreased balance, decreased coordination, decreased endurance, decreased mobility, decreased ROM, decreased strength, hypomobility, increased fascial restrictions, impaired flexibility, postural dysfunction, obesity, and pain.   ACTIVITY LIMITATIONS: carrying, lifting, bending, standing, squatting, sleeping, transfers, continence, toileting, locomotion level, and caring for others  PARTICIPATION LIMITATIONS: meal prep, cleaning, laundry, interpersonal relationship, shopping, and community activity  PERSONAL FACTORS: Age, Past/current experiences, and 3+ comorbidities: see above are also affecting patient's functional outcome.   REHAB POTENTIAL: Good  CLINICAL DECISION MAKING: Stable/uncomplicated  EVALUATION COMPLEXITY: Low   GOALS: Goals reviewed with patient? Yes  SHORT TERM GOALS: Target date: for all STGs: 03/07/24  Pt will be IND in HEP to improve pain, strength, coordination. Baseline: no HEP 10/13: pt feels good with HEP Goal status: MET  2.  Finish exam and write goals as indicated. Baseline: limited by time constraints Goal status: MET  3.  Pt will demo proper toileting posture to fully empty bladder and reduce straining during bowel movement. Baseline: unable to demo 10/13: able to demo Goal status: MET   4.  Pt will demonstrated improved relaxation and contraction of PFM with coordination of breath to reduce urinary leakage to </=four/week. Baseline: mixed UI intermittently 10/13: pt reported she's doing well with UI, she's being more conscious about what/when she's drinking. Leakage at night has improved. Pt reported approx. 45% Improvement on GROC scale. Pt getting up approx. Twice per night on average. Goal status: MET   LONG TERM GOALS: Target date: for all LTGs: 04/04/24  Pt will demonstrated improved relaxation and contraction of PFM with coordination of breath to reduce nocturnal  enuresis to </=once/week. Baseline: nightly, damp but not gushing urine when she awakes Goal status: INITIAL  2.  Pt will join a gym to maximize gains made during PT to improve QOL, reduce leakage, and continue to improve strength. Baseline: no gym Goal status: INITIAL  3.  Pt will be able to amb. 1000' over uneven and even terrain without leaking to improve endurance in prep for travel. Baseline: mixed UI Goal status: INITIAL  4. Pt will be IND in traversing 4 steps without an incr. In pain or leakage to safely traverse steps in the community.   Baseline: impaired balance and back/knee/hip/foot pain traversing stairs Goal status: INITIAL  PLAN:  progress strengthening, internal assessment prn, LE, hip and core strengthening.    PT FREQUENCY: 1x/week  PT DURATION: 8 weeks  PLANNED INTERVENTIONS: [id number]- PT Re-evaluation, 97110-Therapeutic exercises, 97530- Therapeutic activity, [id number]- Neuromuscular re-education, 97535- Self Care, [id number]- Manual therapy, [id number]- Gait training, [phone] (1-2  muscles), [id number] (3+ muscles)- Dry Needling, Patient/Family education, Balance training, Stair training, Taping, Joint mobilization, Spinal mobilization, Scar mobilization, Cryotherapy, Moist heat, and Biofeedback     Aletha Allebach L, PT 03/13/2024, 11:43 AM  Delon Pinal, PT,DPT 03/13/24 11:43 AM Phone: [phone] Fax: [phone]

## 2024-03-20 ENCOUNTER — Ambulatory Visit

## 2024-03-20 ENCOUNTER — Other Ambulatory Visit: Payer: Self-pay

## 2024-03-20 DIAGNOSIS — R293 Abnormal posture: Secondary | ICD-10-CM

## 2024-03-20 DIAGNOSIS — R2689 Other abnormalities of gait and mobility: Secondary | ICD-10-CM

## 2024-03-20 DIAGNOSIS — N3946 Mixed incontinence: Secondary | ICD-10-CM

## 2024-03-20 DIAGNOSIS — M6281 Muscle weakness (generalized): Secondary | ICD-10-CM | POA: Diagnosis not present

## 2024-03-20 DIAGNOSIS — Z87448 Personal history of other diseases of urinary system: Secondary | ICD-10-CM

## 2024-03-20 NOTE — Therapy (Signed)
 OUTPATIENT PHYSICAL THERAPY FEMALE PELVIC TREATMENT   Patient Name: Maureen King MRN: [id number] DOB:Nov 26, 1955, 68 y.o., female Today's Date: 03/20/2024  END OF SESSION:  PT End of Session - 03/20/24 1443     Visit Number 6    Number of Visits 9    Date for Recertification  04/08/24    Authorization Type [employer]    Progress Note Due on Visit 10    PT Start Time 1441    PT Stop Time 1523    PT Time Calculation (min) 42 min    Activity Tolerance Patient tolerated treatment well    Behavior During Therapy WFL for tasks assessed/performed          Past Medical History:  Diagnosis Date   Anxiety    Depression    GERD (gastroesophageal reflux disease)    Insomnia    Obesity    Palpitations    Past Surgical History:  Procedure Laterality Date   ABDOMINAL HYSTERECTOMY  08/29/20   COLONOSCOPY     COLPOSCOPY N/A 05/30/2020   Procedure: COLPOSCOPY;  Surgeon: Victor Claudell SAUNDERS, MD;  Location: [facility];  Service: Gynecology;  Laterality: N/A;   CYSTOSCOPY N/A 08/29/2020   Procedure: CYSTOSCOPY;  Surgeon: Victor Claudell SAUNDERS, MD;  Location: [facility];  Service: Gynecology;  Laterality: N/A;   HYSTEROSCOPY WITH D & C N/A 05/30/2020   Procedure: DILATATION AND CURETTAGE /HYSTEROSCOPY;  Surgeon: Victor Claudell SAUNDERS, MD;  Location: [facility];  Service: Gynecology;  Laterality: N/A;   NO PAST SURGERIES     ROBOTIC ASSISTED LAPAROSCOPIC HYSTERECTOMY AND SALPINGECTOMY Bilateral 08/29/2020   Procedure: XI ROBOTIC ASSISTED LAPAROSCOPIC HYSTERECTOMY AND SALPINGECTOMY, BILATERAL OOPHORECTOMY;  Surgeon: Victor Claudell SAUNDERS, MD;  Location: [facility];  Service: Gynecology;  Laterality: Bilateral;   Patient Active Problem List   Diagnosis Date Noted   OSA (obstructive sleep apnea) 02/03/2024   Nocturnal enuresis 11/02/2023   Osteopenia of neck of femur 06/22/2022   Advance directive discussed with patient 02/27/2022   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   Bursitis of  both hips 08/11/2021   Osteoarthritis of knees, bilateral 08/11/2021   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   Endometrial hyperplasia without atypia    Endometrial polyp    Bradycardia 03/08/2017   Morbid obesity (HCC) 02/12/2015   Depression 02/12/2015   Insomnia 02/12/2015   GERD (gastroesophageal reflux disease) 02/12/2015    PCP: Duwaine Louder, DO  REFERRING PROVIDER: Duwaine Louder, DO  REFERRING DIAG: Nocturnal enuresis  THERAPY DIAG:  Muscle weakness (generalized)  Abnormal posture  History of nocturnal enuresis  Other abnormalities of gait and mobility  Urinary incontinence, mixed  Rationale for Evaluation and Treatment: Rehabilitation  ONSET DATE: 11/02/23 referral date  SUBJECTIVE:  SUBJECTIVE STATEMENT: Pt reported she had a busy week helping with her great nephew's birthday party. She feels a bit tired afterwards. She feels pretty good with exercises but was sore after the party prep. Pt has a three pound weight and five pound weights. She has been staying out of her flip flops. Pt felt like she's improved even more this last week.   EVAL:  URINARY FUNCTION: pt can go up to six hours in b/t voids, she drinks water until she goes to bed 9:30pm. She gets up one time/night (on average). Pt denied chronic UTIs or yeast infection, and no pain. Strong stream and fully emptying. Wakes up damp from urine but not gushing. BOWEL FUNCTION: at least once/day. 50% of the time types 3 and 4 and then 50% of the time softer stools. No fiber or laxatives. Pt denied fecal leakage, pain, or hemorrhoids. Able to fully empty.  CORE STABILITY: hx of laparoscopic hysterectomy, no other ab. Surgeries. Has LBP if she does a lot of work outside, feels like core is weak. Pt reported bad fall when in her  late 30s on icy steps and fell directly on buttocks/tailbone. Pt denied MVAs or accidents. Does have hx of OA, osteopenia, and bursitis (B).  SEXUAL FUNCTION: pt reported dryness with tampon use (when she had a period), pt denied painful OBGYN or intercourse. Pt denied difficulty with climaxing. Pt reported dryness intermittently s/p hysterectomy.   Fluid intake: starts with coffee (12 oz./day), drinks approx. 4-5, 16.9 oz. bottles A day, sometimes unsweetened or half and half tea, diet coke if she goes out (notices soft drinks incr. Nocturnal enuresis), rarely alcohol  PAIN:  Are you having pain? No 03/20/24 When she has back pain: NPRS scale: 3/10, achy and nagging after working in the yard. Resting and stretching helps.   PRECAUTIONS: None  RED FLAGS: None   WEIGHT BEARING RESTRICTIONS: No   FALLS:  Has patient fallen in last 6 months? No but did fall last November.   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  ACTIVITY LEVEL : likes to work in her yard, crafts, sew, helps friends decorate, hang out with friends  PLOF: Independent  PATIENT GOALS: improve core strength to be able to go to silver  sneakers and tolerate it to be able to travel, incr. Endurance, stop leaking  PERTINENT HISTORY:  Depression, obesity, mild OSA, GERD, bursitis of both hips, OA of knees (B), chondromalacia patellae, R achilles tendonopathy but had surgery (steps are problematic), osteopenia of neck of femur, endometrial polyp, full hysterectomy 2022 Sexual abuse: No  BOWEL MOVEMENT: Pain with bowel movement: No Type of bowel movement:Frequency daily Fully empty rectum: Yes:   Leakage: No Pads: No Fiber supplement/laxative No  URINATION: Pain with urination: No Fully empty bladder: Yes:   Stream: Strong Urgency: Yes  Frequency: every 2-4 hours, up to 6 hours Leakage: Walking to the bathroom and Coughingif bladder is full. Pads: No but  sometimes will wear one if she knows a bathroom won't be close by.  INTERCOURSE:  Ability to have vaginal penetration No  Pain with intercourse: none DrynessYes at times Climax: yes Marinoff Scale: 0/3   PREGNANCY: Number of pregnancies: 0 Vaginal deliveries 0 C-section deliveries 0 Currently pregnant No  PROLAPSE: None   OBJECTIVE:  Note: Objective measures were completed at Evaluation unless otherwise noted.   COGNITION: Overall cognitive status: Within functional limits for tasks assessed     SENSATION: Light touch:  Appears intact   GAIT: Assistive device utilized: None Comments: wide BOS, decr. Trunk rot  POSTURE: rounded shoulders, decreased thoracic kyphosis, and posterior pelvic tilt   LUMBARAROM/PROM: all limited by approx. 25%   A/PROM A/PROM  eval  Flexion   Extension   Right lateral flexion   Left lateral flexion   Right rotation   Left rotation    (Blank rows = not tested)  LOWER EXTREMITY ROM: all WNL except for limited B hip IR.  Active ROM Right eval Left eval  Hip flexion    Hip extension    Hip abduction    Hip adduction    Hip internal rotation    Hip external rotation    Knee flexion    Knee extension    Ankle dorsiflexion    Ankle plantarflexion    Ankle inversion    Ankle eversion     (Blank rows = not tested)  LOWER EXTREMITY MMT:  MMT Right eval Left eval  Hip flexion 4 4  Hip extension    Hip abduction 2+ 2+  Hip adduction 3+ 4-  Hip internal rotation 3+ 3+  Hip external rotation 4 4  Knee flexion 4 4  Knee extension 5 5  Ankle dorsiflexion 5 with soreness at surgical site 5  Ankle plantarflexion    Ankle inversion    Ankle eversion     (Blank rows = not tested) PALPATION:   General: no TTP during palpation of spine and SIJ in standing 9/18: no LLD noted. Incr. Infrasternal angle noted, no DR. No pain with palpation over spine or paraspinals but spine hypomobility noted.    PELVIC MMT:   MMT eval   Vaginal   Internal Anal Sphincter   External Anal Sphincter   Puborectalis   Diastasis Recti   (Blank rows = not tested)        TONE:   PROLAPSE:   TODAY'S TREATMENT:                                                                                                                              DATE: 03/20/24    THEREX: Exercises Access Code: [email or web address] URL: [email or web address] Date: 03/20/2024 Prepared by: Delon Pinal  Exercises - Beginner Bridge  - 1 x daily - 3 x weekly - 3 sets - 10 reps - Clamshell with Resistance  - 1 x daily - 3 x weekly - 3 sets - 10 reps - Supine Shoulder Overhead Flexion with THREE pound weight- 1 x daily - 3 x weekly - 3 sets - 10 reps - Supine 90/90 Alternating straightening LEs and hovering  - 1 x daily - 3 x weekly - 4 sets - 5 reps - Supine Pelvic Floor Contraction  - 1-2 x daily - 7 x weekly - 1 sets - 10 reps - Seated Toe Towel Scrunches  - 1 x daily - 7 x weekly - 2-3 sets - 10 reps -R ankle ABC's  ROM activity. - Seated Ankle Eversion with Resistance  - 1 x daily - 3 x weekly - 3 sets - 10 reps Cues and demo for proper technique. S for safety. No pain reported. Transitioning from NMR to therex.   SELF CARE: PATIENT EDUCATION:  Education details: PT educated pt on progressed strengthening and PFM contraction/relaxation with breath to reduce leakage by managing IAP. PT educated pt on the relationship b/t feet and PFM and the importance of improving R ankle ROM and foot strength to encourage more walking and less issues with PFM, as incr. Foot tension and limited ankle ROM can impact PFM. Person educated: Patient Education method: Explanation, Demonstration, and Handouts Education comprehension: verbalized understanding and needs further education  HOME EXERCISE PROGRAM: [email or web address] Medbridge  ASSESSMENT:  CLINICAL IMPRESSION: Skilled session focused on progressing activities to coordinating PFM contraction and relaxation  with breath work to [employer]. IAP and reduce leakage. No pain reported during session but incr. Time to allow PFM relaxation between contractions. Pt demonstrated progress as she tolerated incr. Resistance during HEP. The following impairments continue to be noted: limited ROM, back pain, postural dysfunction, decr. Strength, nocturnal enuresis, impaired balance, and mixed UI. Pt would continue to benefit from skilled PT to improve safety and decr. Pain during all ADLs.   OBJECTIVE IMPAIRMENTS: Abnormal gait, decreased balance, decreased coordination, decreased endurance, decreased mobility, decreased ROM, decreased strength, hypomobility, increased fascial restrictions, impaired flexibility, postural dysfunction, obesity, and pain.   ACTIVITY LIMITATIONS: carrying, lifting, bending, standing, squatting, sleeping, transfers, continence, toileting, locomotion level, and caring for others  PARTICIPATION LIMITATIONS: meal prep, cleaning, laundry, interpersonal relationship, shopping, and community activity  PERSONAL FACTORS: Age, Past/current experiences, and 3+ comorbidities: see above are also affecting patient's functional outcome.   REHAB POTENTIAL: Good  CLINICAL DECISION MAKING: Stable/uncomplicated  EVALUATION COMPLEXITY: Low   GOALS: Goals reviewed with patient? Yes  SHORT TERM GOALS: Target date: for all STGs: 03/07/24  Pt will be IND in HEP to improve pain, strength, coordination. Baseline: no HEP 10/13: pt feels good with HEP Goal status: MET  2.  Finish exam and write goals as indicated. Baseline: limited by time constraints Goal status: MET  3.  Pt will demo proper toileting posture to fully empty bladder and reduce straining during bowel movement. Baseline: unable to demo 10/13: able to demo Goal status: MET   4.  Pt will demonstrated improved relaxation and contraction of PFM with coordination of breath to reduce urinary leakage to </=four/week. Baseline: mixed UI  intermittently 10/13: pt reported she's doing well with UI, she's being more conscious about what/when she's drinking. Leakage at night has improved. Pt reported approx. 45% Improvement on GROC scale. Pt getting up approx. Twice per night on average. Goal status: MET   LONG TERM GOALS: Target date: for all LTGs: 04/04/24  Pt will demonstrated improved relaxation and contraction of PFM with coordination of breath to reduce nocturnal enuresis to </=once/week. Baseline: nightly, damp but not gushing urine when she awakes Goal status: INITIAL  2.  Pt will join a gym to maximize gains made during PT to improve QOL, reduce leakage, and continue to improve strength. Baseline: no gym Goal status: INITIAL  3.  Pt will be able to amb. 1000' over uneven and even terrain without leaking to improve endurance in prep for travel. Baseline: mixed UI Goal status: INITIAL  4. Pt will be IND in traversing 4 steps without an incr. In pain or leakage to safely traverse steps in the community.  Baseline: impaired balance and back/knee/hip/foot pain traversing stairs Goal status: INITIAL  PLAN:  continue to progress strengthening, shoulder/back, internal assessment prn, LE, hip and core strengthening.    PT FREQUENCY: 1x/week  PT DURATION: 8 weeks  PLANNED INTERVENTIONS: 97164- PT Re-evaluation, 97110-Therapeutic exercises, 97530- Therapeutic activity, 97112- Neuromuscular re-education, 97535- Self Care, [id number]- Manual therapy, [phone]- Gait training, [phone] (1-2 muscles), 20561 (3+ muscles)- Dry Needling, Patient/Family education, Balance training, Stair training, Taping, Joint mobilization, Spinal mobilization, Scar mobilization, Cryotherapy, Moist heat, and Biofeedback     Westen Dinino L, PT 03/20/2024, 3:31 PM  Delon Pinal, PT,DPT 03/20/24 3:31 PM Phone: [phone] Fax: [phone]

## 2024-03-23 ENCOUNTER — Telehealth: Payer: Self-pay | Admitting: *Deleted

## 2024-03-23 NOTE — Telephone Encounter (Signed)
 Received fax from [facility] about getting pap therapy.  They have not been able to reach pt.  Will send mychart.

## 2024-03-27 ENCOUNTER — Other Ambulatory Visit: Payer: Self-pay

## 2024-03-27 ENCOUNTER — Ambulatory Visit: Admitting: Family Medicine

## 2024-03-27 ENCOUNTER — Ambulatory Visit

## 2024-03-27 DIAGNOSIS — M6281 Muscle weakness (generalized): Secondary | ICD-10-CM

## 2024-03-27 DIAGNOSIS — N3946 Mixed incontinence: Secondary | ICD-10-CM

## 2024-03-27 DIAGNOSIS — Z87448 Personal history of other diseases of urinary system: Secondary | ICD-10-CM

## 2024-03-27 DIAGNOSIS — R2689 Other abnormalities of gait and mobility: Secondary | ICD-10-CM

## 2024-03-27 DIAGNOSIS — R293 Abnormal posture: Secondary | ICD-10-CM

## 2024-03-27 NOTE — Therapy (Signed)
 OUTPATIENT PHYSICAL THERAPY FEMALE PELVIC TREATMENT   Patient Name: Maureen King MRN: [id number] DOB:30-Mar-1956, 68 y.o., female Today's Date: 03/27/2024  END OF SESSION:  PT End of Session - 03/27/24 1448     Visit Number 7    Number of Visits 9    Date for Recertification  04/08/24    Authorization Type [employer]    Progress Note Due on Visit 10    PT Start Time 1445    PT Stop Time 1525    PT Time Calculation (min) 40 min    Activity Tolerance Patient tolerated treatment well    Behavior During Therapy WFL for tasks assessed/performed          Past Medical History:  Diagnosis Date   Anxiety    Depression    GERD (gastroesophageal reflux disease)    Insomnia    Obesity    Palpitations    Past Surgical History:  Procedure Laterality Date   ABDOMINAL HYSTERECTOMY  08/29/20   COLONOSCOPY     COLPOSCOPY N/A 05/30/2020   Procedure: COLPOSCOPY;  Surgeon: Victor Claudell SAUNDERS, MD;  Location: [facility];  Service: Gynecology;  Laterality: N/A;   CYSTOSCOPY N/A 08/29/2020   Procedure: CYSTOSCOPY;  Surgeon: Victor Claudell SAUNDERS, MD;  Location: [facility];  Service: Gynecology;  Laterality: N/A;   HYSTEROSCOPY WITH D & C N/A 05/30/2020   Procedure: DILATATION AND CURETTAGE /HYSTEROSCOPY;  Surgeon: Victor Claudell SAUNDERS, MD;  Location: [facility];  Service: Gynecology;  Laterality: N/A;   NO PAST SURGERIES     ROBOTIC ASSISTED LAPAROSCOPIC HYSTERECTOMY AND SALPINGECTOMY Bilateral 08/29/2020   Procedure: XI ROBOTIC ASSISTED LAPAROSCOPIC HYSTERECTOMY AND SALPINGECTOMY, BILATERAL OOPHORECTOMY;  Surgeon: Victor Claudell SAUNDERS, MD;  Location: [facility];  Service: Gynecology;  Laterality: Bilateral;   Patient Active Problem List   Diagnosis Date Noted   OSA (obstructive sleep apnea) 02/03/2024   Nocturnal enuresis 11/02/2023   Osteopenia of neck of femur 06/22/2022   Advance directive discussed with patient 02/27/2022   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   Bursitis of  both hips 08/11/2021   Osteoarthritis of knees, bilateral 08/11/2021   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   Endometrial hyperplasia without atypia    Endometrial polyp    Bradycardia 03/08/2017   Morbid obesity (HCC) 02/12/2015   Depression 02/12/2015   Insomnia 02/12/2015   GERD (gastroesophageal reflux disease) 02/12/2015    PCP: Duwaine Louder, DO  REFERRING PROVIDER: Duwaine Louder, DO  REFERRING DIAG: Nocturnal enuresis  THERAPY DIAG:  Muscle weakness (generalized)  Abnormal posture  History of nocturnal enuresis  Other abnormalities of gait and mobility  Urinary incontinence, mixed  Rationale for Evaluation and Treatment: Rehabilitation  ONSET DATE: 11/02/23 referral date  SUBJECTIVE:  SUBJECTIVE STATEMENT: Pt reported she started  Zepbound  last week, mild symptoms. No pain today. She has done HEP as prescribed. She didn't do bands today but that's it.   EVAL:  URINARY FUNCTION: pt can go up to six hours in b/t voids, she drinks water until she goes to bed 9:30pm. She gets up one time/night (on average). Pt denied chronic UTIs or yeast infection, and no pain. Strong stream and fully emptying. Wakes up damp from urine but not gushing. BOWEL FUNCTION: at least once/day. 50% of the time types 3 and 4 and then 50% of the time softer stools. No fiber or laxatives. Pt denied fecal leakage, pain, or hemorrhoids. Able to fully empty.  CORE STABILITY: hx of laparoscopic hysterectomy, no other ab. Surgeries. Has LBP if she does a lot of work outside, feels like core is weak. Pt reported bad fall when in her late 30s on icy steps and fell directly on buttocks/tailbone. Pt denied MVAs or accidents. Does have hx of OA, osteopenia, and bursitis (B).  SEXUAL FUNCTION: pt reported dryness with  tampon use (when she had a period), pt denied painful OBGYN or intercourse. Pt denied difficulty with climaxing. Pt reported dryness intermittently s/p hysterectomy.   Fluid intake: starts with coffee (12 oz./day), drinks approx. 4-5, 16.9 oz. bottles A day, sometimes unsweetened or half and half tea, diet coke if she goes out (notices soft drinks incr. Nocturnal enuresis), rarely alcohol  PAIN:  Are you having pain? No 03/27/24 When she has back pain: NPRS scale: 3/10, achy and nagging after working in the yard. Resting and stretching helps.   PRECAUTIONS: None  RED FLAGS: None   WEIGHT BEARING RESTRICTIONS: No   FALLS:  Has patient fallen in last 6 months? No but did fall last November.   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  ACTIVITY LEVEL : likes to work in her yard, crafts, sew, helps friends decorate, hang out with friends  PLOF: Independent  PATIENT GOALS: improve core strength to be able to go to silver  sneakers and tolerate it to be able to travel, incr. Endurance, stop leaking  PERTINENT HISTORY:  Depression, obesity, mild OSA, GERD, bursitis of both hips, OA of knees (B), chondromalacia patellae, R achilles tendonopathy but had surgery (steps are problematic), osteopenia of neck of femur, endometrial polyp, full hysterectomy 2022 Sexual abuse: No  BOWEL MOVEMENT: Pain with bowel movement: No Type of bowel movement:Frequency daily Fully empty rectum: Yes:   Leakage: No Pads: No Fiber supplement/laxative No  URINATION: Pain with urination: No Fully empty bladder: Yes:   Stream: Strong Urgency: Yes  Frequency: every 2-4 hours, up to 6 hours Leakage: Walking to the bathroom and Coughingif bladder is full. Pads: No but sometimes will wear one if she knows a bathroom won't be close by.  INTERCOURSE:  Ability to have vaginal penetration No  Pain with intercourse: none DrynessYes at times Climax:  yes Marinoff Scale: 0/3   PREGNANCY: Number of pregnancies: 0 Vaginal deliveries 0 C-section deliveries 0 Currently pregnant No  PROLAPSE: None   OBJECTIVE:  Note: Objective measures were completed at Evaluation unless otherwise noted.   COGNITION: Overall cognitive status: Within functional limits for tasks assessed     SENSATION: Light touch: Appears intact   GAIT: Assistive device utilized: None Comments: wide BOS, decr. Trunk rot  POSTURE: rounded shoulders, decreased thoracic kyphosis, and posterior pelvic tilt   LUMBARAROM/PROM: all limited by approx. 25%  A/PROM A/PROM  eval  Flexion   Extension   Right lateral flexion   Left lateral flexion   Right rotation   Left rotation    (Blank rows = not tested)  LOWER EXTREMITY ROM: all WNL except for limited B hip IR.  Active ROM Right eval Left eval  Hip flexion    Hip extension    Hip abduction    Hip adduction    Hip internal rotation    Hip external rotation    Knee flexion    Knee extension    Ankle dorsiflexion    Ankle plantarflexion    Ankle inversion    Ankle eversion     (Blank rows = not tested)  LOWER EXTREMITY MMT:  MMT Right eval Left eval  Hip flexion 4 4  Hip extension    Hip abduction 2+ 2+  Hip adduction 3+ 4-  Hip internal rotation 3+ 3+  Hip external rotation 4 4  Knee flexion 4 4  Knee extension 5 5  Ankle dorsiflexion 5 with soreness at surgical site 5  Ankle plantarflexion    Ankle inversion    Ankle eversion     (Blank rows = not tested) PALPATION:   General: no TTP during palpation of spine and SIJ in standing 9/18: no LLD noted. Incr. Infrasternal angle noted, no DR. No pain with palpation over spine or paraspinals but spine hypomobility noted.    PELVIC MMT:   MMT eval  Vaginal   Internal Anal Sphincter   External Anal Sphincter   Puborectalis   Diastasis Recti   (Blank rows = not tested)        TONE:   PROLAPSE:   TODAY'S TREATMENT:                                                                                                                               DATE: 03/27/24    THEREX: Exercises Access Code: [email or web address] URL: [email or web address] Date: 03/20/2024 Prepared by: Delon Pinal  Exercises - Supine 90/90 Alternating straightening LEs and hovering  - 1 x daily - 3 x weekly - 4 sets - 5 reps - Supine Pelvic Floor Contraction knee out/in - 1-2 x daily - 7 x weekly - 1 sets - 10 reps - Supine Chest Press with Dumbbells  - 1 x daily - 3 x weekly - 3 sets - 10 reps - Bent Over Single Arm Shoulder Row with Dumbbell  - 1 x daily - 7 x weekly - 3 sets - 10 reps - Mini Squat with Counter Support  - 1 x daily - 3 x weekly - 2-3 sets - 10 reps Cues and demo for proper technique. S for safety. No pain reported. Transitioning from NMR to therex.   SELF CARE: PATIENT EDUCATION:  Education details: PT educated pt on new progressed strengthening and PFM contraction/relaxation with breath to continue progress to reduce leakage and improve posture which can impact hip  alignment and PFM tension. Person educated: Patient Education method: Explanation, Demonstration, and Handouts Education comprehension: verbalized understanding and needs further education  HOME EXERCISE PROGRAM: [email or web address] [employer]  ASSESSMENT:  CLINICAL IMPRESSION: Skilled session focused on progressing activities to improve strength and progress PFM contraction/relaxation with breath to continue progress to reduce leakage and improve posture which can impact hip alignment and PFM tension. Pt demonstrated progress as she denied any leakage during session and no pain. The following impairments continue to be noted: limited ROM, back pain, postural dysfunction, decr. Strength, nocturnal enuresis, impaired balance, and mixed UI. Pt would continue to benefit from skilled PT to improve safety and decr. Pain during all ADLs.   OBJECTIVE IMPAIRMENTS:  Abnormal gait, decreased balance, decreased coordination, decreased endurance, decreased mobility, decreased ROM, decreased strength, hypomobility, increased fascial restrictions, impaired flexibility, postural dysfunction, obesity, and pain.   ACTIVITY LIMITATIONS: carrying, lifting, bending, standing, squatting, sleeping, transfers, continence, toileting, locomotion level, and caring for others  PARTICIPATION LIMITATIONS: meal prep, cleaning, laundry, interpersonal relationship, shopping, and community activity  PERSONAL FACTORS: Age, Past/current experiences, and 3+ comorbidities: see above are also affecting patient's functional outcome.   REHAB POTENTIAL: Good  CLINICAL DECISION MAKING: Stable/uncomplicated  EVALUATION COMPLEXITY: Low   GOALS: Goals reviewed with patient? Yes  SHORT TERM GOALS: Target date: for all STGs: 03/07/24  Pt will be IND in HEP to improve pain, strength, coordination. Baseline: no HEP 10/13: pt feels good with HEP Goal status: MET  2.  Finish exam and write goals as indicated. Baseline: limited by time constraints Goal status: MET  3.  Pt will demo proper toileting posture to fully empty bladder and reduce straining during bowel movement. Baseline: unable to demo 10/13: able to demo Goal status: MET   4.  Pt will demonstrated improved relaxation and contraction of PFM with coordination of breath to reduce urinary leakage to </=four/week. Baseline: mixed UI intermittently 10/13: pt reported she's doing well with UI, she's being more conscious about what/when she's drinking. Leakage at night has improved. Pt reported approx. 45% Improvement on GROC scale. Pt getting up approx. Twice per night on average. Goal status: MET   LONG TERM GOALS: Target date: for all LTGs: 04/04/24  Pt will demonstrated improved relaxation and contraction of PFM with coordination of breath to reduce nocturnal enuresis to </=once/week. Baseline: nightly, damp but not gushing  urine when she awakes Goal status: INITIAL  2.  Pt will join a gym to maximize gains made during PT to improve QOL, reduce leakage, and continue to improve strength. Baseline: no gym Goal status: INITIAL  3.  Pt will be able to amb. 1000' over uneven and even terrain without leaking to improve endurance in prep for travel. Baseline: mixed UI Goal status: INITIAL  4. Pt will be IND in traversing 4 steps without an incr. In pain or leakage to safely traverse steps in the community.   Baseline: impaired balance and back/knee/hip/foot pain traversing stairs Goal status: INITIAL  PLAN:  check LTGs, continue to progress strengthening, shoulder/back, internal assessment prn, LE, hip and core strengthening.    PT FREQUENCY: 1x/week  PT DURATION: 8 weeks  PLANNED INTERVENTIONS: 97164- PT Re-evaluation, 97110-Therapeutic exercises, 97530- Therapeutic activity, 97112- Neuromuscular re-education, 97535- Self Care, [id number]- Manual therapy, [phone]- Gait training, [phone] (1-2 muscles), [phone] (3+ muscles)- Dry Needling, Patient/Family education, Balance training, Stair training, Taping, Joint mobilization, Spinal mobilization, Scar mobilization, Cryotherapy, Moist heat, and Biofeedback     Ranald Alessio L, PT 03/27/2024, 2:49 PM  Delon Pinal,  PT,DPT 03/27/24 2:49 PM Phone: [phone] Fax: [phone]

## 2024-04-03 ENCOUNTER — Other Ambulatory Visit: Payer: Self-pay

## 2024-04-03 ENCOUNTER — Ambulatory Visit: Attending: Family Medicine

## 2024-04-03 DIAGNOSIS — M6281 Muscle weakness (generalized): Secondary | ICD-10-CM | POA: Diagnosis present

## 2024-04-03 DIAGNOSIS — R2689 Other abnormalities of gait and mobility: Secondary | ICD-10-CM | POA: Diagnosis present

## 2024-04-03 DIAGNOSIS — N3946 Mixed incontinence: Secondary | ICD-10-CM | POA: Diagnosis present

## 2024-04-03 DIAGNOSIS — Z87448 Personal history of other diseases of urinary system: Secondary | ICD-10-CM | POA: Diagnosis present

## 2024-04-03 DIAGNOSIS — R293 Abnormal posture: Secondary | ICD-10-CM | POA: Insufficient documentation

## 2024-04-03 NOTE — Therapy (Signed)
 OUTPATIENT PHYSICAL THERAPY FEMALE PELVIC TREATMENT   Patient Name: Maureen King MRN: [id number] DOB:12/03/1955, 68 y.o., female Today's Date: 04/03/2024  END OF SESSION:  PT End of Session - 04/03/24 1442     Visit Number 8    Number of Visits 9    Date for Recertification  04/08/24    Authorization Type [employer]    Progress Note Due on Visit 10    PT Start Time 1439    PT Stop Time 1507    PT Time Calculation (min) 28 min    Activity Tolerance Patient tolerated treatment well    Behavior During Therapy WFL for tasks assessed/performed          Past Medical History:  Diagnosis Date   Anxiety    Depression    GERD (gastroesophageal reflux disease)    Insomnia    Obesity    Palpitations    Past Surgical History:  Procedure Laterality Date   ABDOMINAL HYSTERECTOMY  08/29/20   COLONOSCOPY     COLPOSCOPY N/A 05/30/2020   Procedure: COLPOSCOPY;  Surgeon: Victor Claudell SAUNDERS, MD;  Location: [facility];  Service: Gynecology;  Laterality: N/A;   CYSTOSCOPY N/A 08/29/2020   Procedure: CYSTOSCOPY;  Surgeon: Victor Claudell SAUNDERS, MD;  Location: [facility];  Service: Gynecology;  Laterality: N/A;   HYSTEROSCOPY WITH D & C N/A 05/30/2020   Procedure: DILATATION AND CURETTAGE /HYSTEROSCOPY;  Surgeon: Victor Claudell SAUNDERS, MD;  Location: [facility];  Service: Gynecology;  Laterality: N/A;   NO PAST SURGERIES     ROBOTIC ASSISTED LAPAROSCOPIC HYSTERECTOMY AND SALPINGECTOMY Bilateral 08/29/2020   Procedure: XI ROBOTIC ASSISTED LAPAROSCOPIC HYSTERECTOMY AND SALPINGECTOMY, BILATERAL OOPHORECTOMY;  Surgeon: Victor Claudell SAUNDERS, MD;  Location: [facility];  Service: Gynecology;  Laterality: Bilateral;   Patient Active Problem List   Diagnosis Date Noted   OSA (obstructive sleep apnea) 02/03/2024   Nocturnal enuresis 11/02/2023   Osteopenia of neck of femur 06/22/2022   Advance directive discussed with patient 02/27/2022   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   Bursitis of  both hips 08/11/2021   Osteoarthritis of knees, bilateral 08/11/2021   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   Endometrial hyperplasia without atypia    Endometrial polyp    Bradycardia 03/08/2017   Morbid obesity (HCC) 02/12/2015   Depression 02/12/2015   Insomnia 02/12/2015   GERD (gastroesophageal reflux disease) 02/12/2015    PCP: Duwaine Louder, DO  REFERRING PROVIDER: Duwaine Louder, DO  REFERRING DIAG: Nocturnal enuresis  THERAPY DIAG:  Muscle weakness (generalized)  Abnormal posture  History of nocturnal enuresis  Other abnormalities of gait and mobility  Urinary incontinence, mixed  Rationale for Evaluation and Treatment: Rehabilitation  ONSET DATE: 11/02/23 referral date  SUBJECTIVE:  SUBJECTIVE STATEMENT: Pt reported she's doing well overall but still having mild side effects (indigestion) from Zepbound . She hardly has an appetite. She is having water first thing in the morning. She has noticed that her leakage is almost gone, she only had issues one night when she drank a while water bottle before bed. She's been performing HEP consistently. Pt reported she feels approx. 75% improvement since starting PHPT. She no longer wearing foot brace anymore (from surgery), she can go on longer walks and shopping trips without her leg giving out.    EVAL:  URINARY FUNCTION: pt can go up to six hours in b/t voids, she drinks water until she goes to bed 9:30pm. She gets up one time/night (on average). Pt denied chronic UTIs or yeast infection, and no pain. Strong stream and fully emptying. Wakes up damp from urine but not gushing. BOWEL FUNCTION: at least once/day. 50% of the time types 3 and 4 and then 50% of the time softer stools. No fiber or laxatives. Pt denied fecal leakage, pain, or  hemorrhoids. Able to fully empty.  CORE STABILITY: hx of laparoscopic hysterectomy, no other ab. Surgeries. Has LBP if she does a lot of work outside, feels like core is weak. Pt reported bad fall when in her late 30s on icy steps and fell directly on buttocks/tailbone. Pt denied MVAs or accidents. Does have hx of OA, osteopenia, and bursitis (B).  SEXUAL FUNCTION: pt reported dryness with tampon use (when she had a period), pt denied painful OBGYN or intercourse. Pt denied difficulty with climaxing. Pt reported dryness intermittently s/p hysterectomy.   Fluid intake: starts with coffee (12 oz./day), drinks approx. 4-5, 16.9 oz. bottles A day, sometimes unsweetened or half and half tea, diet coke if she goes out (notices soft drinks incr. Nocturnal enuresis), rarely alcohol  PAIN:  Are you having pain? No 04/03/24 When she has back pain: NPRS scale: 3/10, achy and nagging after working in the yard. Resting and stretching helps.   PRECAUTIONS: None  RED FLAGS: None   WEIGHT BEARING RESTRICTIONS: No   FALLS:  Has patient fallen in last 6 months? No but did fall last November.   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  ACTIVITY LEVEL : likes to work in her yard, crafts, sew, helps friends decorate, hang out with friends  PLOF: Independent  PATIENT GOALS: improve core strength to be able to go to silver  sneakers and tolerate it to be able to travel, incr. Endurance, stop leaking  PERTINENT HISTORY:  Depression, obesity, mild OSA, GERD, bursitis of both hips, OA of knees (B), chondromalacia patellae, R achilles tendonopathy but had surgery (steps are problematic), osteopenia of neck of femur, endometrial polyp, full hysterectomy 2022 Sexual abuse: No  BOWEL MOVEMENT: Pain with bowel movement: No Type of bowel movement:Frequency daily Fully empty rectum: Yes:   Leakage: No Pads: No Fiber supplement/laxative No  URINATION: Pain  with urination: No Fully empty bladder: Yes:   Stream: Strong Urgency: Yes  Frequency: every 2-4 hours, up to 6 hours Leakage: Walking to the bathroom and Coughingif bladder is full. Pads: No but sometimes will wear one if she knows a bathroom won't be close by.  INTERCOURSE:  Ability to have vaginal penetration No  Pain with intercourse: none DrynessYes at times Climax: yes Marinoff Scale: 0/3   PREGNANCY: Number of pregnancies: 0 Vaginal deliveries 0 C-section deliveries 0 Currently pregnant No  PROLAPSE: None   OBJECTIVE:  Note: Objective measures were completed at Evaluation unless otherwise noted.   COGNITION: Overall cognitive status: Within functional limits for tasks assessed     SENSATION: Light touch: Appears intact   GAIT: Assistive device utilized: None Comments: wide BOS, decr. Trunk rot  POSTURE: rounded shoulders, decreased thoracic kyphosis, and posterior pelvic tilt   LUMBARAROM/PROM: all limited by approx. 25%   A/PROM A/PROM  eval  Flexion   Extension   Right lateral flexion   Left lateral flexion   Right rotation   Left rotation    (Blank rows = not tested)  LOWER EXTREMITY ROM: all WNL except for limited B hip IR.  Active ROM Right eval Left eval  Hip flexion    Hip extension    Hip abduction    Hip adduction    Hip internal rotation    Hip external rotation    Knee flexion    Knee extension    Ankle dorsiflexion    Ankle plantarflexion    Ankle inversion    Ankle eversion     (Blank rows = not tested)  LOWER EXTREMITY MMT:  MMT Right eval Left eval  Hip flexion 4 4  Hip extension    Hip abduction 2+ 2+  Hip adduction 3+ 4-  Hip internal rotation 3+ 3+  Hip external rotation 4 4  Knee flexion 4 4  Knee extension 5 5  Ankle dorsiflexion 5 with soreness at surgical site 5  Ankle plantarflexion    Ankle inversion    Ankle eversion     (Blank rows = not tested) PALPATION:   General: no TTP during palpation  of spine and SIJ in standing 9/18: no LLD noted. Incr. Infrasternal angle noted, no DR. No pain with palpation over spine or paraspinals but spine hypomobility noted.    PELVIC MMT:   MMT eval  Vaginal   Internal Anal Sphincter   External Anal Sphincter   Puborectalis   Diastasis Recti   (Blank rows = not tested)        TONE:   PROLAPSE:   TODAY'S TREATMENT:                                                                                                                              DATE: 04/03/24    THEREX: Access Code: [email or web address] URL: [email or web address] Date: 04/03/2024 Prepared by: Delon Pinal  Exercises - Supine Pelvic Floor Contraction  - 1-2 x daily - 7 x weekly - 1 sets - 10 reps able to hold for almost 10 sec. - Mini Squat with Counter Support  - 1 x daily - 3 x weekly - 2-3 sets - 10 reps with and without wider BOS 2/2 R knee pain. Cues and demo for proper technique. S for safety. R knee pain with squat, improved after manual therapy.  MANUAL THERAPY: PT performed inf/sup and lat/med patella jt. Mobs grade 3-4 to improve mobility and decr. Pain with squats. Then  PT educated pt on how to perform on herself as needed.  SELF CARE: PATIENT EDUCATION:  Education details: PT educated pt on goal progress and how to reduce R knee pain during squats with jt mobs and reducing frequency to one more visit as needed as pt very pleased with progress and wants to join gym prior to d/c. Person educated: Patient Education method: Programmer, Multimedia, Facilities Manager, and Handouts Education comprehension: verbalized understanding and needs further education  HOME EXERCISE PROGRAM: [email or web address] [employer]  ASSESSMENT:  CLINICAL IMPRESSION: Pt demonstrated progress as she reported 75% improvement on GROC scale and met STGs 1 and 3 and partially met STGs 2 and 4-but is much improved. Pt's R knee pain during mini squats decr. After manual therapy, allowing pt to perform  without pain. PT will hold pt one month for one last visit to ensure no incr. In pain or leakage while joining a gym. PT's schedule is booked so it will not be until 05/22/24. The following impairments continue to be noted: limited ROM, back pain, postural dysfunction, decr. Strength, nocturnal enuresis, impaired balance, and mixed UI. Pt would continue to benefit from skilled PT to improve safety and decr. Pain during all ADLs.   OBJECTIVE IMPAIRMENTS: Abnormal gait, decreased balance, decreased coordination, decreased endurance, decreased mobility, decreased ROM, decreased strength, hypomobility, increased fascial restrictions, impaired flexibility, postural dysfunction, obesity, and pain.   ACTIVITY LIMITATIONS: carrying, lifting, bending, standing, squatting, sleeping, transfers, continence, toileting, locomotion level, and caring for others  PARTICIPATION LIMITATIONS: meal prep, cleaning, laundry, interpersonal relationship, shopping, and community activity  PERSONAL FACTORS: Age, Past/current experiences, and 3+ comorbidities: see above are also affecting patient's functional outcome.   REHAB POTENTIAL: Good  CLINICAL DECISION MAKING: Stable/uncomplicated  EVALUATION COMPLEXITY: Low   GOALS: Goals reviewed with patient? Yes  SHORT TERM GOALS: Target date: for all STGs: 03/07/24  Pt will be IND in HEP to improve pain, strength, coordination. Baseline: no HEP 10/13: pt feels good with HEP Goal status: MET  2.  Finish exam and write goals as indicated. Baseline: limited by time constraints Goal status: MET  3.  Pt will demo proper toileting posture to fully empty bladder and reduce straining during bowel movement. Baseline: unable to demo 10/13: able to demo Goal status: MET   4.  Pt will demonstrated improved relaxation and contraction of PFM with coordination of breath to reduce urinary leakage to </=four/week. Baseline: mixed UI intermittently 10/13: pt reported she's doing  well with UI, she's being more conscious about what/when she's drinking. Leakage at night has improved. Pt reported approx. 45% Improvement on GROC scale. Pt getting up approx. Twice per night on average. Goal status: MET   LONG TERM GOALS: Target date: for all LTGs: 04/04/24  Pt will demonstrated improved relaxation and contraction of PFM with coordination of breath to reduce nocturnal enuresis to </=once/week. Baseline: nightly, damp but not gushing urine when she awakes; 11/3: most of the time she is dry-just had one night this week because she had a bottle of water before bed. Goal status: MET  2.  Pt will join a gym to maximize gains made during PT to improve QOL, reduce leakage, and continue to improve strength. Baseline: no gym; 11/3: she hasn't joined yet but plans to join after Medicare enrollment on Friday.  Goal status: PARTIALLY MET as she plans to join gym with [employer]  Sneakers  3.  Pt will be able to amb. 1000' over uneven and even terrain without leaking to improve endurance in prep for  travel. Baseline: mixed UI; 11/3: she is good per pt Goal status: MET  4. Pt will be IND in traversing 4 steps without an incr. In pain or leakage to safely traverse steps in the community.   Baseline: impaired balance and back/knee/hip/foot pain traversing stairs; 11/3: much, much better per pt-not 100% but much better. Goal status: PARTIALLY MET 2/2 more strength related.   PLAN:  d/c   PT FREQUENCY: one more visit to assess after joining/going to a gym next available in one month is 05/22/24  PT DURATION: 8 weeks  PLANNED INTERVENTIONS: 97164- PT Re-evaluation, 97110-Therapeutic exercises, 97530- Therapeutic activity, 97112- Neuromuscular re-education, 97535- Self Care, [id number]- Manual therapy, [phone]- Gait training, [phone] (1-2 muscles), 20561 (3+ muscles)- Dry Needling, Patient/Family education, Balance training, Stair training, Taping, Joint mobilization, Spinal mobilization, Scar  mobilization, Cryotherapy, Moist heat, and Biofeedback     Mareo Portilla L, PT 04/03/2024, 3:18 PM  Delon Pinal, PT,DPT 04/03/24 3:18 PM Phone: [phone] Fax: [phone]

## 2024-04-10 ENCOUNTER — Encounter

## 2024-04-20 ENCOUNTER — Ambulatory Visit

## 2024-05-01 ENCOUNTER — Encounter: Payer: Self-pay | Admitting: Family Medicine

## 2024-05-01 ENCOUNTER — Ambulatory Visit: Admitting: Family Medicine

## 2024-05-01 MED ORDER — TIRZEPATIDE-WEIGHT MANAGEMENT 10 MG/0.5ML ~~LOC~~ SOLN: 10.0000 mg | SUBCUTANEOUS | 1 refills | Status: DC

## 2024-05-01 MED ORDER — TIRZEPATIDE-WEIGHT MANAGEMENT 7.5 MG/0.5ML ~~LOC~~ SOLN: 7.5000 mg | SUBCUTANEOUS | 1 refills | Status: DC

## 2024-05-01 NOTE — Progress Notes (Signed)
 BP 109/71   Pulse 87   Temp 98 F (36.7 C) (Oral)   Ht 5' 0.5 (1.537 m)   Wt 213 lb 6.4 oz (96.8 kg)   LMP  (LMP Unknown)   SpO2 97%   BMI 40.99 kg/m    Subjective:    Patient ID: Maureen King, female    DOB: 09/10/1955, 68 y.o.   MRN: [id number]  HPI: Maureen King is a 68 y.o. female  Chief Complaint  Patient presents with   Obesity   Obstructive Sleep Apnea   OBESITY- having some indigestion with certain foods, does well with tums Duration: chronic Previous attempts at weight loss: yes, diet, exercise, weight watchers Complications of obesity: OSA, depression, arthritis, GERD Peak weight: 222  Weight loss goal: to be healthy Weight loss to date: 9lbs Requesting obesity pharmacotherapy: yes Current weight loss supplements/medications: no Previous weight loss supplements/meds: no   Relevant past medical, surgical, family and social history reviewed and updated as indicated. Interim medical history since our last visit reviewed. Allergies and medications reviewed and updated.  Review of Systems  Constitutional: Negative.   Respiratory: Negative.    Cardiovascular: Negative.   Musculoskeletal: Negative.   Neurological: Negative.   Psychiatric/Behavioral: Negative.      Per HPI unless specifically indicated above     Objective:    BP 109/71   Pulse 87   Temp 98 F (36.7 C) (Oral)   Ht 5' 0.5 (1.537 m)   Wt 213 lb 6.4 oz (96.8 kg)   LMP  (LMP Unknown)   SpO2 97%   BMI 40.99 kg/m   Wt Readings from Last 3 Encounters:  05/01/24 213 lb 6.4 oz (96.8 kg)  03/03/24 222 lb (100.7 kg)  02/03/24 222 lb (100.7 kg)    Physical Exam Vitals and nursing note reviewed.  Constitutional:      General: She is not in acute distress.    Appearance: Normal appearance. She is not ill-appearing, toxic-appearing or diaphoretic.  HENT:     Head: Normocephalic and atraumatic.     Right Ear: External ear normal.     Left Ear: External ear normal.      Nose: Nose normal.     Mouth/Throat:     Mouth: Mucous membranes are moist.     Pharynx: Oropharynx is clear.  Eyes:     General: No scleral icterus.       Right eye: No discharge.        Left eye: No discharge.     Extraocular Movements: Extraocular movements intact.     Conjunctiva/sclera: Conjunctivae normal.     Pupils: Pupils are equal, round, and reactive to light.  Cardiovascular:     Rate and Rhythm: Normal rate and regular rhythm.     Pulses: Normal pulses.     Heart sounds: Normal heart sounds. No murmur heard.    No friction rub. No gallop.  Pulmonary:     Effort: Pulmonary effort is normal. No respiratory distress.     Breath sounds: Normal breath sounds. No stridor. No wheezing, rhonchi or rales.  Chest:     Chest wall: No tenderness.  Musculoskeletal:        General: Normal range of motion.     Cervical back: Normal range of motion and neck supple.  Skin:    General: Skin is warm and dry.     Capillary Refill: Capillary refill takes less than 2 seconds.     Coloration: Skin is not  jaundiced or pale.     Findings: No bruising, erythema, lesion or rash.  Neurological:     General: No focal deficit present.     Mental Status: She is alert and oriented to person, place, and time. Mental status is at baseline.  Psychiatric:        Mood and Affect: Mood normal.        Behavior: Behavior normal.        Thought Content: Thought content normal.        Judgment: Judgment normal.     Results for orders placed or performed in visit on 05/04/23  CBC with Differential/Platelet   Collection Time: 05/04/23  9:55 AM  Result Value Ref Range   WBC 5.9 3.4 - 10.8 x10E3/uL   RBC 4.49 3.77 - 5.28 x10E6/uL   Hemoglobin 13.2 11.1 - 15.9 g/dL   Hematocrit 57.9 65.9 - 46.6 %   MCV 94 79 - 97 fL   MCH 29.4 26.6 - 33.0 pg   MCHC 31.4 (L) 31.5 - 35.7 g/dL   RDW 87.3 88.2 - 84.5 %   Platelets 277 150 - 450 x10E3/uL   Neutrophils 75 Not Estab. %   Lymphs 14 Not Estab. %    Monocytes 6 Not Estab. %   Eos 4 Not Estab. %   Basos 1 Not Estab. %   Neutrophils Absolute 4.4 1.4 - 7.0 x10E3/uL   Lymphocytes Absolute 0.9 0.7 - 3.1 x10E3/uL   Monocytes Absolute 0.4 0.1 - 0.9 x10E3/uL   EOS (ABSOLUTE) 0.3 0.0 - 0.4 x10E3/uL   Basophils Absolute 0.0 0.0 - 0.2 x10E3/uL   Immature Granulocytes 0 Not Estab. %   Immature Grans (Abs) 0.0 0.0 - 0.1 x10E3/uL  Comprehensive metabolic panel   Collection Time: 05/04/23  9:55 AM  Result Value Ref Range   Glucose 88 70 - 99 mg/dL   BUN 17 8 - 27 mg/dL   Creatinine, Ser 9.33 0.57 - 1.00 mg/dL   eGFR 97 >40 fO/fpw/8.26   BUN/Creatinine Ratio 26 12 - 28   Sodium 140 134 - 144 mmol/L   Potassium 4.3 3.5 - 5.2 mmol/L   Chloride 104 96 - 106 mmol/L   CO2 26 20 - 29 mmol/L   Calcium 9.4 8.7 - 10.3 mg/dL   Total Protein 6.0 6.0 - 8.5 g/dL   Albumin 3.9 3.9 - 4.9 g/dL   Globulin, Total 2.1 1.5 - 4.5 g/dL   Bilirubin Total 0.3 0.0 - 1.2 mg/dL   Alkaline Phosphatase 111 44 - 121 IU/L   AST 21 0 - 40 IU/L   ALT 21 0 - 32 IU/L  Lipid Panel w/o Chol/HDL Ratio   Collection Time: 05/04/23  9:55 AM  Result Value Ref Range   Cholesterol, Total 226 (H) 100 - 199 mg/dL   Triglycerides 811 (H) 0 - 149 mg/dL   HDL 77 >60 mg/dL   VLDL Cholesterol Cal 32 5 - 40 mg/dL   LDL Chol Calc (NIH) 882 (H) 0 - 99 mg/dL  TSH   Collection Time: 05/04/23  9:55 AM  Result Value Ref Range   TSH 1.330 0.450 - 4.500 uIU/mL      Assessment & Plan:   Problem List Items Addressed This Visit       Other   Morbid obesity (HCC) - Primary   Weight down 4.05% of her weight. Tolerating her medicine well. Will continue titration. Recheck 6-8 weeks. Call with any concerns.       Relevant Medications  tirzepatide  7.5 MG/0.5ML injection vial   tirzepatide  10 MG/0.5ML injection vial (Start on 05/29/2024)     Follow up plan: Return 6-8 weeks, for physical.

## 2024-05-01 NOTE — Assessment & Plan Note (Signed)
 Weight down 4.05% of her weight. Tolerating her medicine well. Will continue titration. Recheck 6-8 weeks. Call with any concerns.

## 2024-05-22 ENCOUNTER — Ambulatory Visit

## 2024-05-30 ENCOUNTER — Other Ambulatory Visit: Payer: Self-pay | Admitting: Family Medicine

## 2024-05-30 DIAGNOSIS — Z1231 Encounter for screening mammogram for malignant neoplasm of breast: Secondary | ICD-10-CM

## 2024-06-21 ENCOUNTER — Ambulatory Visit
Admission: RE | Admit: 2024-06-21 | Discharge: 2024-06-21 | Disposition: A | Discharge status: Home / Self Care | Source: Ambulatory Visit | Attending: Family Medicine | Admitting: Family Medicine

## 2024-06-21 DIAGNOSIS — Z1231 Encounter for screening mammogram for malignant neoplasm of breast: Secondary | ICD-10-CM | POA: Diagnosis present

## 2024-06-24 ENCOUNTER — Ambulatory Visit: Payer: Self-pay | Admitting: Family Medicine

## 2024-06-29 ENCOUNTER — Ambulatory Visit: Admitting: Family Medicine

## 2024-06-29 ENCOUNTER — Encounter: Payer: Self-pay | Admitting: Family Medicine

## 2024-06-29 VITALS — BP 123/76 | HR 62 | Temp 97.4°F | Resp 17 | Ht 60.51 in | Wt 206.8 lb

## 2024-06-29 DIAGNOSIS — Z136 Encounter for screening for cardiovascular disorders: Secondary | ICD-10-CM | POA: Diagnosis not present

## 2024-06-29 DIAGNOSIS — F3341 Major depressive disorder, recurrent, in partial remission: Secondary | ICD-10-CM | POA: Diagnosis not present

## 2024-06-29 DIAGNOSIS — Z0000 Encounter for general adult medical examination without abnormal findings: Secondary | ICD-10-CM

## 2024-06-29 DIAGNOSIS — K219 Gastro-esophageal reflux disease without esophagitis: Secondary | ICD-10-CM

## 2024-06-29 LAB — BAYER DCA HB A1C WAIVED: HB A1C (BAYER DCA - WAIVED): 4.9 % (ref 4.8–5.6)

## 2024-06-29 MED ORDER — PAROXETINE HCL ER 12.5 MG PO TB24: 12.5000 mg | ORAL_TABLET | Freq: Every day | ORAL | 1 refills | Status: AC

## 2024-06-29 MED ORDER — TIRZEPATIDE-WEIGHT MANAGEMENT 10 MG/0.5ML ~~LOC~~ SOLN: 10.0000 mg | SUBCUTANEOUS | 6 refills | Status: AC

## 2024-06-29 NOTE — Progress Notes (Signed)
 "  BP 123/76 (BP Location: Left Arm, Patient Position: Sitting, Cuff Size: Normal)   Pulse 62   Temp (!) 97.4 F (36.3 C) (Oral)   Resp 17   Ht 5' 0.51 (1.537 m)   Wt 206 lb 12.8 oz (93.8 kg)   LMP  (LMP Unknown)   SpO2 95%   BMI 39.71 kg/m    Subjective:    Patient ID: Maureen King, female    DOB: [birth year], 69 y.o.   MRN: [id number]  HPI: Maureen King is a 69 y.o. female presenting on 06/29/2024 for comprehensive medical examination. Current medical complaints include:  OBESITY- was having more indigestion on the 7.5mg  for a couple of weeks and that seems to have calmed down. Has been able to increase to the 10mg  Duration: chronic Previous attempts at weight loss: yes, diet, exercise, weight watchers Complications of obesity: OSA, depression, arthritis, GERD Peak weight: 222  Weight loss goal: to be healthy Weight loss to date: 16lbs Requesting obesity pharmacotherapy: yes Current weight loss supplements/medications: no Previous weight loss supplements/meds: no   DEPRESSION Duration: chronic Mood status: controlled Satisfied with current treatment?: yes Symptom severity: mild  Duration of current treatment : chronic Side effects: no Medication compliance: excellent compliance Psychotherapy/counseling: no  Previous psychiatric medications: paxil  Depressed mood: no Anxious mood: no Anhedonia: no Significant weight loss or gain: yes Insomnia: no  Fatigue: no Feelings of worthlessness or guilt: no Impaired concentration/indecisiveness: no Suicidal ideations: no Hopelessness: no Crying spells: no     06/29/2024    8:34 AM 05/01/2024   11:12 AM 02/03/2024    8:44 AM 11/02/2023   10:17 AM 05/04/2023    9:55 AM  Depression screen PHQ 2/9  Decreased Interest 0 0 0 1 0  Down, Depressed, Hopeless 0 0 0 1 0  PHQ - 2 Score 0 0 0 2 0  Altered sleeping 0 0 0 1 0  Tired, decreased energy 0 0 0 1 0  Change in appetite 0 0 0 1 0  Feeling bad or failure about  yourself  0 0 0   0  Trouble concentrating 0 0 0   0  Moving slowly or fidgety/restless 0 0 0 0 0  Suicidal thoughts 0 0 0 0 0  PHQ-9 Score 0 0 0  5  0   Difficult doing work/chores Not difficult at all     Not difficult at all Not difficult at all     Data saved with a previous flowsheet row definition      Menopausal Symptoms: no  Functional Status Survey: Is the patient deaf or have difficulty hearing?: No Does the patient have difficulty seeing, even when wearing glasses/contacts?: No Does the patient have difficulty concentrating, remembering, or making decisions?: No Does the patient have difficulty walking or climbing stairs?: No Does the patient have difficulty dressing or bathing?: No Does the patient have difficulty doing errands alone such as visiting a doctor's office or shopping?: No     06/29/2024    9:15 AM 06/29/2024    8:34 AM 05/01/2024   11:12 AM 05/04/2023    9:54 AM 02/23/2023    9:53 AM  Fall Risk   Falls in the past year? 0 0 0 1 0  Number falls in past yr: 0 0 0 1 0  Injury with Fall? 0 0 0  0  0   Risk for fall due to : No Fall Risks No Fall Risks No Fall Risks History of  fall(s) No Fall Risks  Follow up Falls evaluation completed Falls evaluation completed Falls evaluation completed Falls evaluation completed Falls evaluation completed     Data saved with a previous flowsheet row definition    Depression Screen    06/29/2024    8:34 AM 05/01/2024   11:12 AM 02/03/2024    8:44 AM 11/02/2023   10:17 AM 05/04/2023    9:55 AM  Depression screen PHQ 2/9  Decreased Interest 0 0 0 1 0  Down, Depressed, Hopeless 0 0 0 1 0  PHQ - 2 Score 0 0 0 2 0  Altered sleeping 0 0 0 1 0  Tired, decreased energy 0 0 0 1 0  Change in appetite 0 0 0 1 0  Feeling bad or failure about yourself  0 0 0  0  Trouble concentrating 0 0 0  0  Moving slowly or fidgety/restless 0 0 0 0 0  Suicidal thoughts 0 0 0 0 0  PHQ-9 Score 0 0 0  5  0   Difficult doing work/chores Not  difficult at all   Not difficult at all Not difficult at all     Data saved with a previous flowsheet row definition     Advanced Directives Does patient have a HCPOA?    no If yes, name and contact information:  Does patient have a living will or MOST form?  no  Past Medical History:  Past Medical History:  Diagnosis Date   Anxiety    Depression    GERD (gastroesophageal reflux disease)    Insomnia    Obesity    Palpitations     Surgical History:  Past Surgical History:  Procedure Laterality Date   ABDOMINAL HYSTERECTOMY  08/29/20   COLONOSCOPY     COLPOSCOPY N/A 05/30/2020   Procedure: COLPOSCOPY;  Surgeon: Victor Claudell SAUNDERS, MD;  Location: [facility];  Service: Gynecology;  Laterality: N/A;   CYSTOSCOPY N/A 08/29/2020   Procedure: CYSTOSCOPY;  Surgeon: Victor Claudell SAUNDERS, MD;  Location: [facility];  Service: Gynecology;  Laterality: N/A;   HYSTEROSCOPY WITH D & C N/A 05/30/2020   Procedure: DILATATION AND CURETTAGE /HYSTEROSCOPY;  Surgeon: Victor Claudell SAUNDERS, MD;  Location: [facility];  Service: Gynecology;  Laterality: N/A;   NO PAST SURGERIES     ROBOTIC ASSISTED LAPAROSCOPIC HYSTERECTOMY AND SALPINGECTOMY Bilateral 08/29/2020   Procedure: XI ROBOTIC ASSISTED LAPAROSCOPIC HYSTERECTOMY AND SALPINGECTOMY, BILATERAL OOPHORECTOMY;  Surgeon: Victor Claudell SAUNDERS, MD;  Location: [facility];  Service: Gynecology;  Laterality: Bilateral;    Medications:  Current Outpatient Medications on File Prior to Visit  Medication Sig   aspirin EC 81 MG tablet Take 81 mg by mouth daily. Swallow whole.   Magnesium 250 MG CAPS    Multiple Vitamins-Minerals (CENTRUM SILVER  WOMEN 50+ PO) Take 1 tablet by mouth.   No current facility-administered medications on file prior to visit.    Allergies:  Allergies[1]  Social History:  Social History   Socioeconomic History   Marital status: Married    Spouse name: Not on file   Number of children: Not on file   Years of education: Not  on file   Highest education level: Some college, no degree  Occupational History   Not on file  Tobacco Use   Smoking status: Never   Smokeless tobacco: Never  Vaping Use   Vaping status: Never Used  Substance and Sexual Activity   Alcohol use: Yes    Alcohol/week: 0.0 standard drinks of alcohol  Comment: on occasion (4x/year)   Drug use: No   Sexual activity: Not Currently  Other Topics Concern   Not on file  Social History Narrative   Not on file   Social Drivers of Health   Tobacco Use: Low Risk (06/29/2024)   Patient History    Smoking Tobacco Use: Never    Smokeless Tobacco Use: Never    Passive Exposure: Not on file  Financial Resource Strain: Low Risk (02/02/2024)   Overall Financial Resource Strain (CARDIA)    Difficulty of Paying Living Expenses: Not hard at all  Food Insecurity: No Food Insecurity (02/02/2024)   Epic    Worried About Radiation Protection Practitioner of Food in the Last Year: Never true    Ran Out of Food in the Last Year: Never true  Transportation Needs: No Transportation Needs (02/02/2024)   Epic    Lack of Transportation (Medical): No    Lack of Transportation (Non-Medical): No  Physical Activity: Inactive (02/02/2024)   Exercise Vital Sign    Days of Exercise per Week: 0 days    Minutes of Exercise per Session: Not on file  Stress: No Stress Concern Present (02/02/2024)   [employer] of Occupational Health - Occupational Stress Questionnaire    Feeling of Stress: Only a little  Social Connections: Socially Integrated (02/02/2024)   Social Connection and Isolation Panel    Frequency of Communication with Friends and Family: More than three times a week    Frequency of Social Gatherings with Friends and Family: Once a week    Attends Religious Services: More than 4 times per year    Active Member of Clubs or Organizations: Yes    Attends Engineer, Structural: More than 4 times per year    Marital Status: Married  Catering Manager Violence: Not on file   Depression (PHQ2-9): Low Risk (06/29/2024)   Depression (PHQ2-9)    PHQ-2 Score: 0  Alcohol Screen: Low Risk (02/23/2023)   Alcohol Screen    Last Alcohol Screening Score (AUDIT): 1  Housing: Unknown (02/02/2024)   Epic    Unable to Pay for Housing in the Last Year: No    Number of Times Moved in the Last Year: Not on file    Homeless in the Last Year: No  Utilities: Not on file  Health Literacy: Not on file   Tobacco Use History[2] Social History   Substance and Sexual Activity  Alcohol Use Yes   Alcohol/week: 0.0 standard drinks of alcohol   Comment: on occasion (4x/year)    Family History:  Family History  Problem Relation Age of Onset   Hypertension Mother    Osteoporosis Mother    Heart disease Father 15       Stent   Lung disease Father    Idiopathic pulmonary fibrosis Father    Hypertension Brother    Cancer Maternal Grandmother        gallbladder   Heart disease Paternal Grandfather        MI   Breast cancer Neg Hx     Past medical history, surgical history, medications, allergies, family history and social history reviewed with patient today and changes made to appropriate areas of the chart.   Review of Systems  Constitutional:  Positive for diaphoresis. Negative for chills, fever, malaise/fatigue and weight loss.  HENT: Negative.    Eyes:  Positive for blurred vision. Negative for double vision, photophobia, pain, discharge and redness.  Respiratory: Negative.    Cardiovascular: Negative.  Gastrointestinal:  Positive for heartburn. Negative for abdominal pain, blood in stool, constipation, diarrhea, melena, nausea and vomiting.  Genitourinary: Negative.   Musculoskeletal: Negative.   Skin: Negative.   Neurological: Negative.   Endo/Heme/Allergies: Negative.   Psychiatric/Behavioral: Negative.     All other ROS negative except what is listed above and in the HPI.      Objective:    BP 123/76 (BP Location: Left Arm, Patient Position: Sitting, Cuff  Size: Normal)   Pulse 62   Temp (!) 97.4 F (36.3 C) (Oral)   Resp 17   Ht 5' 0.51 (1.537 m)   Wt 206 lb 12.8 oz (93.8 kg)   LMP  (LMP Unknown)   SpO2 95%   BMI 39.71 kg/m   Wt Readings from Last 3 Encounters:  06/29/24 206 lb 12.8 oz (93.8 kg)  05/01/24 213 lb 6.4 oz (96.8 kg)  03/03/24 222 lb (100.7 kg)    No results found.  Physical Exam Vitals and nursing note reviewed.  Constitutional:      General: She is not in acute distress.    Appearance: Normal appearance. She is obese. She is not ill-appearing, toxic-appearing or diaphoretic.  HENT:     Head: Normocephalic and atraumatic.     Right Ear: Tympanic membrane, ear canal and external ear normal. There is no impacted cerumen.     Left Ear: Tympanic membrane, ear canal and external ear normal. There is no impacted cerumen.     Nose: Nose normal. No congestion or rhinorrhea.     Mouth/Throat:     Mouth: Mucous membranes are moist.     Pharynx: Oropharynx is clear. No oropharyngeal exudate or posterior oropharyngeal erythema.  Eyes:     General: No scleral icterus.       Right eye: No discharge.        Left eye: No discharge.     Extraocular Movements: Extraocular movements intact.     Conjunctiva/sclera: Conjunctivae normal.     Pupils: Pupils are equal, round, and reactive to light.  Neck:     Vascular: No carotid bruit.  Cardiovascular:     Rate and Rhythm: Normal rate and regular rhythm.     Pulses: Normal pulses.     Heart sounds: No murmur heard.    No friction rub. No gallop.  Pulmonary:     Effort: Pulmonary effort is normal. No respiratory distress.     Breath sounds: Normal breath sounds. No stridor. No wheezing, rhonchi or rales.  Chest:     Chest wall: No tenderness.  Abdominal:     General: Abdomen is flat. Bowel sounds are normal. There is no distension.     Palpations: Abdomen is soft. There is no mass.     Tenderness: There is no abdominal tenderness. There is no right CVA tenderness, left CVA  tenderness, guarding or rebound.     Hernia: No hernia is present.  Genitourinary:    Comments: Breast and pelvic exams deferred with shared decision making Musculoskeletal:        General: No swelling, tenderness, deformity or signs of injury.     Cervical back: Normal range of motion and neck supple. No rigidity. No muscular tenderness.     Right lower leg: No edema.     Left lower leg: No edema.  Lymphadenopathy:     Cervical: No cervical adenopathy.  Skin:    General: Skin is warm and dry.     Capillary Refill: Capillary refill takes less than  2 seconds.     Coloration: Skin is not jaundiced or pale.     Findings: No bruising, erythema, lesion or rash.  Neurological:     General: No focal deficit present.     Mental Status: She is alert and oriented to person, place, and time. Mental status is at baseline.     Cranial Nerves: No cranial nerve deficit.     Sensory: No sensory deficit.     Motor: No weakness.     Coordination: Coordination normal.     Gait: Gait normal.     Deep Tendon Reflexes: Reflexes normal.  Psychiatric:        Mood and Affect: Mood normal.        Behavior: Behavior normal.        Thought Content: Thought content normal.        Judgment: Judgment normal.        06/29/2024    9:15 AM 05/04/2023   10:15 AM 02/27/2022   10:04 AM  6CIT Screen  What Year? 0 points 0 points 0 points  What month? 0 points 0 points 0 points  What time? 0 points 0 points 0 points  Count back from 20 0 points 0 points 0 points  Months in reverse 0 points 0 points 0 points  Repeat phrase 0 points 0 points 0 points  Total Score 0 points 0 points 0 points    Results for orders placed or performed in visit on 06/29/24  Bayer DCA Hb A1c Waived   Collection Time: 06/29/24  9:02 AM  Result Value Ref Range   HB A1C (BAYER DCA - WAIVED) 4.9 4.8 - 5.6 %      Assessment & Plan:   Problem List Items Addressed This Visit       Digestive   GERD (gastroesophageal reflux  disease)   Under good control on current regimen. Continue current regimen. Continue to monitor. Call with any concerns. Refills given. Labs drawn today.        Relevant Orders   CBC with Differential/Platelet   Comprehensive metabolic panel with GFR   B12     Other   Morbid obesity (HCC)   Congratulated patient on 16lb weight loss for 7.2% of her body weight. Will stay on the 10mg  of zepbound  right now- will message if she wants to go up on it before next visit.      Relevant Medications   tirzepatide  10 MG/0.5ML injection vial   Other Relevant Orders   Comprehensive metabolic panel with GFR   TSH   Bayer DCA Hb A1c Waived (Completed)   B12   Depression   Under good control on current regimen. Continue current regimen. Continue to monitor. Call with any concerns. Refills given.        Relevant Medications   PARoxetine  (PAXIL -CR) 12.5 MG 24 hr tablet   Other Relevant Orders   Comprehensive metabolic panel with GFR   TSH   B12   Other Visit Diagnoses       Encounter for annual wellness exam in Medicare patient    -  Primary   Preventative care discussed today as below.     Routine general medical examination at a health care facility       Vaccines up to date. Screening labs checked today. Colonoscopy, mammo and DEXA up to date. Continue diet and exercise. Call with any concerns.     Screening for cardiovascular condition       Labs drawn  today. Await results.   Relevant Orders   Comprehensive metabolic panel with GFR   Lipid Panel w/o Chol/HDL Ratio        Preventative Services:  Health Risk Assessment and Personalized Prevention Plan: Done today Bone Mass Measurements: up to date Breast Cancer Screening: up to date CVD Screening: up to date Cervical Cancer Screening: N/A Colon Cancer Screening: up to date Depression Screening: done today Diabetes Screening: done today Glaucoma Screening: see your eye doctor Hepatitis B vaccine: N/A Hepatitis C screening:  up to date HIV Screening: up to date Flu Vaccine: up to date Lung cancer Screening: N/A Obesity Screening: done today Pneumonia Vaccines: up to date STI Screening: N/A  Follow up plan: Return in about 3 months (around 09/27/2024) for OK to cancel AWV- already done.   LABORATORY TESTING:  - Pap smear: not applicable   IMMUNIZATIONS:   - Tdap: Tetanus vaccination status reviewed: last tetanus booster within 10 years. - Influenza: Up to date - Prevnar: Up to date - COVID: Refused - HPV: Not applicable - Shingrix  vaccine: Up to date   SCREENING: -Mammogram: Up to date  - Colonoscopy: Up to date  - Bone Density: Up to date   PATIENT COUNSELING:   Advised to take 1 mg of folate supplement per day if capable of pregnancy.   Sexuality: Discussed sexually transmitted diseases, partner selection, use of condoms, avoidance of unintended pregnancy  and contraceptive alternatives.   Advised to avoid cigarette smoking.  I discussed with the patient that most people either abstain from alcohol or drink within safe limits (<=14/week and <=4 drinks/occasion for males, <=7/weeks and <= 3 drinks/occasion for females) and that the risk for alcohol disorders and other health effects rises proportionally with the number of drinks per week and how often a drinker exceeds daily limits.  Discussed cessation/primary prevention of drug use and availability of treatment for abuse.   Diet: Encouraged to adjust caloric intake to maintain  or achieve ideal body weight, to reduce intake of dietary saturated fat and total fat, to limit sodium intake by avoiding high sodium foods and not adding table salt, and to maintain adequate dietary potassium and calcium preferably from fresh fruits, vegetables, and low-fat dairy products.    stressed the importance of regular exercise  Injury prevention: Discussed safety belts, safety helmets, smoke detector, smoking near bedding or upholstery.   Dental health:  Discussed importance of regular tooth brushing, flossing, and dental visits.    NEXT PREVENTATIVE PHYSICAL DUE IN 1 YEAR. Return in about 3 months (around 09/27/2024) for OK to cancel AWV- already done.              [1]  Allergies Allergen Reactions   Wound Dressing Adhesive Rash   Penicillins Rash   Neosporin [Neomycin-Polymyxin-Gramicidin] Rash  [2]  Social History Tobacco Use  Smoking Status Never  Smokeless Tobacco Never   "

## 2024-06-29 NOTE — Assessment & Plan Note (Signed)
 Under good control on current regimen. Continue current regimen. Continue to monitor. Call with any concerns. Refills given. Labs drawn today.

## 2024-06-29 NOTE — Progress Notes (Signed)
 "  Chief Complaint  Patient presents with   Annual Exam    Here for her annual exam and following up with zepbound      Subjective:   Maureen King is a 69 y.o. female who presents for a Medicare Annual Wellness Visit.  Visit info / Clinical Intake: Medicare Wellness Visit Type:: Initial Annual Wellness Visit Persons participating in visit and providing information:: patient Medicare Wellness Visit Mode:: In-person (required for WTM) Interpreter Needed?: No Pre-visit prep was completed: yes AWV questionnaire completed by patient prior to visit?: no Patient's Overall Health Status Rating: very good Typical amount of pain: none Does pain affect daily life?: no Are you currently prescribed opioids?: no  Dietary Habits and Nutritional Risks How many meals a day?: 3 Eats fruit and vegetables daily?: yes Most meals are obtained by: preparing own meals In the last 2 weeks, have you had any of the following?: none Diabetic:: no  Functional Status Activities of Daily Living (to include ambulation/medication): Independent Ambulation: Independent Medication Administration: Independent Home Management (perform basic housework or laundry): Independent Manage your own finances?: yes Primary transportation is: driving Concerns about vision?: no *vision screening is required for WTM* Concerns about hearing?: no  Fall Screening Falls in the past year?: 0 Number of falls in past year: 0 Was there an injury with Fall?: 0 Fall Risk Category Calculator: 0 Patient Fall Risk Level: Low Fall Risk  Fall Risk Patient at Risk for Falls Due to: No Fall Risks Fall risk Follow up: Falls evaluation completed  Home and Transportation Safety: All rugs have non-skid backing?: yes All stairs or steps have railings?: yes Grab bars in the bathtub or shower?: (!) no Have non-skid surface in bathtub or shower?: yes Good home lighting?: yes Regular seat belt use?: yes Hospital stays in the  last year:: no  Cognitive Assessment Difficulty concentrating, remembering, or making decisions? : no Will 6CIT or Mini Cog be Completed: yes What year is it?: 0 points What month is it?: 0 points Give patient an address phrase to remember (5 components): [address] About what time is it?: 0 points Count backwards from 20 to 1: 0 points Say the months of the year in reverse: 0 points Repeat the address phrase from earlier: 0 points 6 CIT Score: 0 points  Advance Directives (For Healthcare) Does Patient Have a Medical Advance Directive?: No Type of Advance Directive: Healthcare Power of [place]; Living will Would patient like information on creating a medical advance directive?: No - Patient declined  Reviewed/Updated  Reviewed/Updated: Reviewed All (Medical, Surgical, Family, Medications, Allergies, Care Teams, Patient Goals); Medical History; Surgical History; Family History; Medications; Allergies; Care Teams    Allergies (verified) Wound dressing adhesive, Penicillins, and Neosporin [neomycin-polymyxin-gramicidin]   Current Medications (verified) Outpatient Encounter Medications as of 06/29/2024  Medication Sig   aspirin EC 81 MG tablet Take 81 mg by mouth daily. Swallow whole.   Magnesium 250 MG CAPS    Multiple Vitamins-Minerals (CENTRUM SILVER  WOMEN 50+ PO) Take 1 tablet by mouth.   [DISCONTINUED] PARoxetine  (PAXIL -CR) 12.5 MG 24 hr tablet Take 1 tablet (12.5 mg total) by mouth daily.   [DISCONTINUED] tirzepatide  10 MG/0.5ML injection vial Inject 10 mg into the skin once a week.   PARoxetine  (PAXIL -CR) 12.5 MG 24 hr tablet Take 1 tablet (12.5 mg total) by mouth daily.   tirzepatide  10 MG/0.5ML injection vial Inject 10 mg into the skin once a week.   [DISCONTINUED] meloxicam  (MOBIC ) 15 MG tablet  (Patient not  taking: Reported on 06/29/2024)   [DISCONTINUED] tirzepatide  5 MG/0.5ML injection vial Inject 5 mg into the skin once a week. (Patient not taking: Reported on  06/29/2024)   [DISCONTINUED] tirzepatide  7.5 MG/0.5ML injection vial Inject 7.5 mg into the skin once a week. (Patient not taking: Reported on 06/29/2024)   No facility-administered encounter medications on file as of 06/29/2024.    History: Past Medical History:  Diagnosis Date   Anxiety    Depression    GERD (gastroesophageal reflux disease)    Insomnia    Obesity    Palpitations    Past Surgical History:  Procedure Laterality Date   ABDOMINAL HYSTERECTOMY  08/29/20   COLONOSCOPY     COLPOSCOPY N/A 05/30/2020   Procedure: COLPOSCOPY;  Surgeon: Victor Claudell SAUNDERS, MD;  Location: [facility];  Service: Gynecology;  Laterality: N/A;   CYSTOSCOPY N/A 08/29/2020   Procedure: CYSTOSCOPY;  Surgeon: Victor Claudell SAUNDERS, MD;  Location: [facility];  Service: Gynecology;  Laterality: N/A;   HYSTEROSCOPY WITH D & C N/A 05/30/2020   Procedure: DILATATION AND CURETTAGE /HYSTEROSCOPY;  Surgeon: Victor Claudell SAUNDERS, MD;  Location: [facility];  Service: Gynecology;  Laterality: N/A;   NO PAST SURGERIES     ROBOTIC ASSISTED LAPAROSCOPIC HYSTERECTOMY AND SALPINGECTOMY Bilateral 08/29/2020   Procedure: XI ROBOTIC ASSISTED LAPAROSCOPIC HYSTERECTOMY AND SALPINGECTOMY, BILATERAL OOPHORECTOMY;  Surgeon: Victor Claudell SAUNDERS, MD;  Location: [facility];  Service: Gynecology;  Laterality: Bilateral;   Family History  Problem Relation Age of Onset   Hypertension Mother    Osteoporosis Mother    Heart disease Father 50       Stent   Lung disease Father    Idiopathic pulmonary fibrosis Father    Hypertension Brother    Cancer Maternal Grandmother        gallbladder   Heart disease Paternal Grandfather        MI   Breast cancer Neg Hx    Social History   Occupational History   Not on file  Tobacco Use   Smoking status: Never   Smokeless tobacco: Never  Vaping Use   Vaping status: Never Used  Substance and Sexual Activity   Alcohol use: Yes    Alcohol/week: 0.0 standard drinks of alcohol     Comment: on occasion (4x/year)   Drug use: No   Sexual activity: Not Currently   Tobacco Counseling Counseling given: Not Answered  SDOH Screenings   Food Insecurity: No Food Insecurity (02/02/2024)  Housing: Unknown (02/02/2024)  Transportation Needs: No Transportation Needs (02/02/2024)  Alcohol Screen: Low Risk (02/23/2023)  Depression (PHQ2-9): Low Risk (06/29/2024)  Financial Resource Strain: Low Risk (02/02/2024)  Physical Activity: Inactive (02/02/2024)  Social Connections: Socially Integrated (02/02/2024)  Stress: No Stress Concern Present (02/02/2024)  Tobacco Use: Low Risk (06/29/2024)   See flowsheets for full screening details  Depression Screen PHQ 2 & 9 Depression Scale- Over the past 2 weeks, how often have you been bothered by any of the following problems? Little interest or pleasure in doing things: 0 Feeling down, depressed, or hopeless (PHQ Adolescent also includes...irritable): 0 PHQ-2 Total Score: 0 Trouble falling or staying asleep, or sleeping too much: 0 Feeling tired or having little energy: 0 Poor appetite or overeating (PHQ Adolescent also includes...weight loss): 0 Feeling bad about yourself - or that you are a failure or have let yourself or your family down: 0 Trouble concentrating on things, such as reading the newspaper or watching television (PHQ Adolescent also includes...like school work): 0 Moving  or speaking so slowly that other people could have noticed. Or the opposite - being so fidgety or restless that you have been moving around a lot more than usual: 0 Thoughts that you would be better off dead, or of hurting yourself in some way: 0 PHQ-9 Total Score: 0 If you checked off any problems, how difficult have these problems made it for you to do your work, take care of things at home, or get along with other people?: Not difficult at all  Depression Treatment Depression Interventions/Treatment : Medication; Currently on Treatment     Goals Addressed              This Visit's Progress    losing weight       Would love to get down to 150lbs             Objective:    Today's Vitals   06/29/24 0829  BP: 123/76  Pulse: 62  Resp: 17  Temp: (!) 97.4 F (36.3 C)  TempSrc: Oral  SpO2: 95%  Weight: 206 lb 12.8 oz (93.8 kg)  Height: 5' 0.51 (1.537 m)  PainSc: 0-No pain   Body mass index is 39.71 kg/m.  Hearing/Vision screen No results found. Immunizations and Health Maintenance Health Maintenance  Topic Date Due   Medicare Annual Wellness (AWV)  06/29/2025   Mammogram  06/21/2026   Colonoscopy  06/20/2028   DTaP/Tdap/Td (3 - Td or Tdap) 01/08/2031   Pneumococcal Vaccine: 50+ Years  Completed   Influenza Vaccine  Completed   Bone Density Scan  Completed   Hepatitis C Screening  Completed   Zoster Vaccines- Shingrix   Completed   Meningococcal B Vaccine  Aged Out   COVID-19 Vaccine  Discontinued        Assessment/Plan:  This is a routine wellness examination for [place].  Patient Care Team: Vicci Duwaine SQUIBB, DO as PCP - General (Family Medicine) Regal, [place] RAMAN, DPM as Consulting Physician (Podiatry) Cathlyn Seal, MD as Referring Physician (Dermatology)  I have personally reviewed and noted the following in the patients chart:   Medical and social history Use of alcohol, tobacco or illicit drugs  Current medications and supplements including opioid prescriptions. Functional ability and status Nutritional status Physical activity Advanced directives List of other physicians Hospitalizations, surgeries, and ER visits in previous 12 months Vitals Screenings to include cognitive, depression, and falls Referrals and appointments  Orders Placed This Encounter  Procedures   CBC with Differential/Platelet   Comprehensive metabolic panel with GFR    Has the patient fasted?:   Yes   Lipid Panel w/o Chol/HDL Ratio    Has the patient fasted?:   Yes   TSH   Bayer DCA Hb A1c Waived   B12   In  addition, I have reviewed and discussed with patient certain preventive protocols, quality metrics, and best practice recommendations. A written personalized care plan for preventive services as well as general preventive health recommendations were provided to patient.   Duwaine Vicci, DO   06/29/2024   Return in about 3 months (around 09/27/2024) for OK to cancel AWV- already done.  After Visit Summary: (In Person-Printed) AVS printed and given to the patient   "

## 2024-06-29 NOTE — Assessment & Plan Note (Signed)
 Under good control on current regimen. Continue current regimen. Continue to monitor. Call with any concerns. Refills given.

## 2024-06-29 NOTE — Assessment & Plan Note (Signed)
 Congratulated patient on 16lb weight loss for 7.2% of her body weight. Will stay on the 10mg  of zepbound  right now- will message if she wants to go up on it before next visit.

## 2024-06-29 NOTE — Patient Instructions (Signed)
 [name],  Thank you for taking the time for your Medicare Wellness Visit. I appreciate your continued commitment to your health goals. Please review the care plan we discussed, and feel free to reach out if I can assist you further.  Please note that Annual Wellness Visits do not include a physical exam. Some assessments may be limited, especially if the visit was conducted virtually. If needed, we may recommend an in-person follow-up with your provider.  Ongoing Care Seeing your primary care provider every 3 to 6 months helps us  monitor your health and provide consistent, personalized care.   Referrals If a referral was made during today's visit and you haven't received any updates within two weeks, please contact the referred provider directly to check on the status.  Recommended Screenings:  Health Maintenance  Topic Date Due   Medicare Annual Wellness Visit  05/03/2024   Breast Cancer Screening  06/21/2026   Colon Cancer Screening  06/20/2028   DTaP/Tdap/Td vaccine (3 - Td or Tdap) 01/08/2031   Pneumococcal Vaccine for age over 78  Completed   Flu Shot  Completed   Osteoporosis screening with Bone Density Scan  Completed   Hepatitis C Screening  Completed   Zoster (Shingles) Vaccine  Completed   Meningitis B Vaccine  Aged Out   COVID-19 Vaccine  Discontinued       02/08/2024   11:52 AM  Advanced Directives  Does Patient Have a Medical Advance Directive? Yes  Type of Estate Agent of [place];Living will    Vision: Annual vision screenings are recommended for early detection of glaucoma, cataracts, and diabetic retinopathy. These exams can also reveal signs of chronic conditions such as diabetes and high blood pressure.  Dental: Annual dental screenings help detect early signs of oral cancer, gum disease, and other conditions linked to overall health, including heart disease and diabetes.  Please see the attached documents for additional preventive  care recommendations.   Preventative Services:  Health Risk Assessment and Personalized Prevention Plan: Done today Bone Mass Measurements: up to date Breast Cancer Screening: up to date CVD Screening: up to date Cervical Cancer Screening: N/A Colon Cancer Screening: up to date Depression Screening: done today Diabetes Screening: done today Glaucoma Screening: see your eye doctor Hepatitis B vaccine: N/A Hepatitis C screening: up to date HIV Screening: up to date Flu Vaccine: up to date Lung cancer Screening: N/A Obesity Screening: done today Pneumonia Vaccines: up to date STI Screening: N/A

## 2024-06-29 NOTE — Progress Notes (Deleted)
 "  BP 123/76 (BP Location: Left Arm, Patient Position: Sitting, Cuff Size: Normal)   Pulse 62   Temp (!) 97.4 F (36.3 C) (Oral)   Resp 17   Ht 5' 0.51 (1.537 m)   Wt 206 lb 12.8 oz (93.8 kg)   LMP  (LMP Unknown)   SpO2 95%   BMI 39.71 kg/m    Subjective:    Patient ID: Maureen King, female    DOB: 03-03-1956, 69 y.o.   MRN: [id number]  HPI: Maureen King is a 69 y.o. female presenting on 06/29/2024 for comprehensive medical examination. Current medical complaints include:  OBESITY- was having more indigestion on the 7.5mg  for a couple of weeks and that seems to have calmed down. Has been able to increase to the 10mg  Duration: chronic Previous attempts at weight loss: yes, diet, exercise, weight watchers Complications of obesity: OSA, depression, arthritis, GERD Peak weight: 222  Weight loss goal: to be healthy Weight loss to date: 16lbs Requesting obesity pharmacotherapy: yes Current weight loss supplements/medications: no Previous weight loss supplements/meds: no  DEPRESSION Duration: chronic Mood status: controlled Satisfied with current treatment?: yes Symptom severity: mild  Duration of current treatment : chronic Side effects: no Medication compliance: excellent compliance Psychotherapy/counseling: no  Previous psychiatric medications: paxil  Depressed mood: no Anxious mood: no Anhedonia: no Significant weight loss or gain: yes Insomnia: no  Fatigue: no Feelings of worthlessness or guilt: no Impaired concentration/indecisiveness: no Suicidal ideations: no Hopelessness: no Crying spells: no    06/29/2024    8:34 AM 05/01/2024   11:12 AM 02/03/2024    8:44 AM 11/02/2023   10:17 AM 05/04/2023    9:55 AM  Depression screen PHQ 2/9  Decreased Interest 0 0 0 1 0  Down, Depressed, Hopeless 0 0 0 1 0  PHQ - 2 Score 0 0 0 2 0  Altered sleeping 0 0 0 1 0  Tired, decreased energy 0 0 0 1 0  Change in appetite 0 0 0 1 0  Feeling bad or failure about  yourself  0 0 0  0  Trouble concentrating 0 0 0  0  Moving slowly or fidgety/restless 0 0 0 0 0  Suicidal thoughts 0 0 0 0 0  PHQ-9 Score 0 0 0  5  0   Difficult doing work/chores Not difficult at all   Not difficult at all Not difficult at all     Data saved with a previous flowsheet row definition    Menopausal Symptoms: no  Depression Screen done today and results listed below:     06/29/2024    8:34 AM 05/01/2024   11:12 AM 02/03/2024    8:44 AM 11/02/2023   10:17 AM 05/04/2023    9:55 AM  Depression screen PHQ 2/9  Decreased Interest 0 0 0 1 0  Down, Depressed, Hopeless 0 0 0 1 0  PHQ - 2 Score 0 0 0 2 0  Altered sleeping 0 0 0 1 0  Tired, decreased energy 0 0 0 1 0  Change in appetite 0 0 0 1 0  Feeling bad or failure about yourself  0 0 0  0  Trouble concentrating 0 0 0  0  Moving slowly or fidgety/restless 0 0 0 0 0  Suicidal thoughts 0 0 0 0 0  PHQ-9 Score 0 0 0  5  0   Difficult doing work/chores Not difficult at all   Not difficult at all Not difficult at all  Data saved with a previous flowsheet row definition     Past Medical History:  Past Medical History:  Diagnosis Date   Anxiety    Depression    GERD (gastroesophageal reflux disease)    Insomnia    Obesity    Palpitations     Surgical History:  Past Surgical History:  Procedure Laterality Date   ABDOMINAL HYSTERECTOMY  08/29/20   COLONOSCOPY     COLPOSCOPY N/A 05/30/2020   Procedure: COLPOSCOPY;  Surgeon: Victor Claudell SAUNDERS, MD;  Location: [facility];  Service: Gynecology;  Laterality: N/A;   CYSTOSCOPY N/A 08/29/2020   Procedure: CYSTOSCOPY;  Surgeon: Victor Claudell SAUNDERS, MD;  Location: [facility];  Service: Gynecology;  Laterality: N/A;   HYSTEROSCOPY WITH D & C N/A 05/30/2020   Procedure: DILATATION AND CURETTAGE /HYSTEROSCOPY;  Surgeon: Victor Claudell SAUNDERS, MD;  Location: [facility];  Service: Gynecology;  Laterality: N/A;   NO PAST SURGERIES     ROBOTIC ASSISTED LAPAROSCOPIC HYSTERECTOMY  AND SALPINGECTOMY Bilateral 08/29/2020   Procedure: XI ROBOTIC ASSISTED LAPAROSCOPIC HYSTERECTOMY AND SALPINGECTOMY, BILATERAL OOPHORECTOMY;  Surgeon: Victor Claudell SAUNDERS, MD;  Location: [facility];  Service: Gynecology;  Laterality: Bilateral;    Medications:  Current Outpatient Medications on File Prior to Visit  Medication Sig   aspirin EC 81 MG tablet Take 81 mg by mouth daily. Swallow whole.   Magnesium 250 MG CAPS    Multiple Vitamins-Minerals (CENTRUM SILVER  WOMEN 50+ PO) Take 1 tablet by mouth.   No current facility-administered medications on file prior to visit.    Allergies:  Allergies[1]  Social History:  Social History   Socioeconomic History   Marital status: Married    Spouse name: Not on file   Number of children: Not on file   Years of education: Not on file   Highest education level: Some college, no degree  Occupational History   Not on file  Tobacco Use   Smoking status: Never   Smokeless tobacco: Never  Vaping Use   Vaping status: Never Used  Substance and Sexual Activity   Alcohol use: Yes    Alcohol/week: 0.0 standard drinks of alcohol    Comment: on occasion (4x/year)   Drug use: No   Sexual activity: Not Currently  Other Topics Concern   Not on file  Social History Narrative   Not on file   Social Drivers of Health   Tobacco Use: Low Risk (06/29/2024)   Patient History    Smoking Tobacco Use: Never    Smokeless Tobacco Use: Never    Passive Exposure: Not on file  Financial Resource Strain: Low Risk (02/02/2024)   Overall Financial Resource Strain (CARDIA)    Difficulty of Paying Living Expenses: Not hard at all  Food Insecurity: No Food Insecurity (02/02/2024)   Epic    Worried About Radiation Protection Practitioner of Food in the Last Year: Never true    Ran Out of Food in the Last Year: Never true  Transportation Needs: No Transportation Needs (02/02/2024)   Epic    Lack of Transportation (Medical): No    Lack of Transportation (Non-Medical): No  Physical  Activity: Inactive (02/02/2024)   Exercise Vital Sign    Days of Exercise per Week: 0 days    Minutes of Exercise per Session: Not on file  Stress: No Stress Concern Present (02/02/2024)   [employer] of Occupational Health - Occupational Stress Questionnaire    Feeling of Stress: Only a little  Social Connections: Socially Integrated (02/02/2024)   Social  Connection and Isolation Panel    Frequency of Communication with Friends and Family: More than three times a week    Frequency of Social Gatherings with Friends and Family: Once a week    Attends Religious Services: More than 4 times per year    Active Member of Clubs or Organizations: Yes    Attends Engineer, Structural: More than 4 times per year    Marital Status: Married  Catering Manager Violence: Not on file  Depression (PHQ2-9): Low Risk (06/29/2024)   Depression (PHQ2-9)    PHQ-2 Score: 0  Alcohol Screen: Low Risk (02/23/2023)   Alcohol Screen    Last Alcohol Screening Score (AUDIT): 1  Housing: Unknown (02/02/2024)   Epic    Unable to Pay for Housing in the Last Year: No    Number of Times Moved in the Last Year: Not on file    Homeless in the Last Year: No  Utilities: Not on file  Health Literacy: Not on file   Tobacco Use History[2] Social History   Substance and Sexual Activity  Alcohol Use Yes   Alcohol/week: 0.0 standard drinks of alcohol   Comment: on occasion (4x/year)    Family History:  Family History  Problem Relation Age of Onset   Hypertension Mother    Osteoporosis Mother    Heart disease Father 77       Stent   Lung disease Father    Idiopathic pulmonary fibrosis Father    Hypertension Brother    Cancer Maternal Grandmother        gallbladder   Heart disease Paternal Grandfather        MI   Breast cancer Neg Hx     Past medical history, surgical history, medications, allergies, family history and social history reviewed with patient today and changes made to appropriate areas of  the chart.   Review of Systems  Constitutional:  Positive for diaphoresis. Negative for chills, fever, malaise/fatigue and weight loss.  HENT: Negative.    Eyes:  Positive for blurred vision. Negative for double vision, photophobia, pain, discharge and redness.  Respiratory: Negative.    Cardiovascular: Negative.   Gastrointestinal:  Positive for heartburn. Negative for abdominal pain, blood in stool, constipation, diarrhea, melena, nausea and vomiting.  Genitourinary: Negative.   Musculoskeletal: Negative.   Skin: Negative.   Neurological: Negative.   Endo/Heme/Allergies: Negative.   Psychiatric/Behavioral: Negative.     All other ROS negative except what is listed above and in the HPI.      Objective:    BP 123/76 (BP Location: Left Arm, Patient Position: Sitting, Cuff Size: Normal)   Pulse 62   Temp (!) 97.4 F (36.3 C) (Oral)   Resp 17   Ht 5' 0.51 (1.537 m)   Wt 206 lb 12.8 oz (93.8 kg)   LMP  (LMP Unknown)   SpO2 95%   BMI 39.71 kg/m   Wt Readings from Last 3 Encounters:  06/29/24 206 lb 12.8 oz (93.8 kg)  05/01/24 213 lb 6.4 oz (96.8 kg)  03/03/24 222 lb (100.7 kg)    Physical Exam  Results for orders placed or performed in visit on 05/04/23  CBC with Differential/Platelet   Collection Time: 05/04/23  9:55 AM  Result Value Ref Range   WBC 5.9 3.4 - 10.8 x10E3/uL   RBC 4.49 3.77 - 5.28 x10E6/uL   Hemoglobin 13.2 11.1 - 15.9 g/dL   Hematocrit 57.9 65.9 - 46.6 %   MCV 94 79 - 97  fL   MCH 29.4 26.6 - 33.0 pg   MCHC 31.4 (L) 31.5 - 35.7 g/dL   RDW 87.3 88.2 - 84.5 %   Platelets 277 150 - 450 x10E3/uL   Neutrophils 75 Not Estab. %   Lymphs 14 Not Estab. %   Monocytes 6 Not Estab. %   Eos 4 Not Estab. %   Basos 1 Not Estab. %   Neutrophils Absolute 4.4 1.4 - 7.0 x10E3/uL   Lymphocytes Absolute 0.9 0.7 - 3.1 x10E3/uL   Monocytes Absolute 0.4 0.1 - 0.9 x10E3/uL   EOS (ABSOLUTE) 0.3 0.0 - 0.4 x10E3/uL   Basophils Absolute 0.0 0.0 - 0.2 x10E3/uL   Immature  Granulocytes 0 Not Estab. %   Immature Grans (Abs) 0.0 0.0 - 0.1 x10E3/uL  Comprehensive metabolic panel   Collection Time: 05/04/23  9:55 AM  Result Value Ref Range   Glucose 88 70 - 99 mg/dL   BUN 17 8 - 27 mg/dL   Creatinine, Ser 9.33 0.57 - 1.00 mg/dL   eGFR 97 >40 fO/fpw/8.26   BUN/Creatinine Ratio 26 12 - 28   Sodium 140 134 - 144 mmol/L   Potassium 4.3 3.5 - 5.2 mmol/L   Chloride 104 96 - 106 mmol/L   CO2 26 20 - 29 mmol/L   Calcium 9.4 8.7 - 10.3 mg/dL   Total Protein 6.0 6.0 - 8.5 g/dL   Albumin 3.9 3.9 - 4.9 g/dL   Globulin, Total 2.1 1.5 - 4.5 g/dL   Bilirubin Total 0.3 0.0 - 1.2 mg/dL   Alkaline Phosphatase 111 44 - 121 IU/L   AST 21 0 - 40 IU/L   ALT 21 0 - 32 IU/L  Lipid Panel w/o Chol/HDL Ratio   Collection Time: 05/04/23  9:55 AM  Result Value Ref Range   Cholesterol, Total 226 (H) 100 - 199 mg/dL   Triglycerides 811 (H) 0 - 149 mg/dL   HDL 77 >60 mg/dL   VLDL Cholesterol Cal 32 5 - 40 mg/dL   LDL Chol Calc (NIH) 882 (H) 0 - 99 mg/dL  TSH   Collection Time: 05/04/23  9:55 AM  Result Value Ref Range   TSH 1.330 0.450 - 4.500 uIU/mL      Assessment & Plan:   Problem List Items Addressed This Visit       Digestive   GERD (gastroesophageal reflux disease)   Relevant Orders   CBC with Differential/Platelet   Comprehensive metabolic panel with GFR   B12     Other   Morbid obesity (HCC)   Relevant Medications   tirzepatide  10 MG/0.5ML injection vial   Other Relevant Orders   Comprehensive metabolic panel with GFR   TSH   Bayer DCA Hb A1c Waived   B12   Depression   Relevant Medications   PARoxetine  (PAXIL -CR) 12.5 MG 24 hr tablet   Other Relevant Orders   Comprehensive metabolic panel with GFR   TSH   B12   Other Visit Diagnoses       Routine general medical examination at a health care facility    -  Primary     Screening for cardiovascular condition       Relevant Orders   Comprehensive metabolic panel with GFR   Lipid Panel w/o  Chol/HDL Ratio        Follow up plan: No follow-ups on file.   LABORATORY TESTING:  - Pap smear: not applicable  IMMUNIZATIONS:   - Tdap: Tetanus vaccination status reviewed: last tetanus booster  within 10 years. - Influenza: Up to date - Prevnar: Up to date - COVID: Refused - HPV: Not applicable - Shingrix  vaccine: Up to date  SCREENING: -Mammogram: Up to date  - Colonoscopy: Up to date  - Bone Density: Up to date   PATIENT COUNSELING:   Advised to take 1 mg of folate supplement per day if capable of pregnancy.   Sexuality: Discussed sexually transmitted diseases, partner selection, use of condoms, avoidance of unintended pregnancy  and contraceptive alternatives.   Advised to avoid cigarette smoking.  I discussed with the patient that most people either abstain from alcohol or drink within safe limits (<=14/week and <=4 drinks/occasion for males, <=7/weeks and <= 3 drinks/occasion for females) and that the risk for alcohol disorders and other health effects rises proportionally with the number of drinks per week and how often a drinker exceeds daily limits.  Discussed cessation/primary prevention of drug use and availability of treatment for abuse.   Diet: Encouraged to adjust caloric intake to maintain  or achieve ideal body weight, to reduce intake of dietary saturated fat and total fat, to limit sodium intake by avoiding high sodium foods and not adding table salt, and to maintain adequate dietary potassium and calcium preferably from fresh fruits, vegetables, and low-fat dairy products.    stressed the importance of regular exercise  Injury prevention: Discussed safety belts, safety helmets, smoke detector, smoking near bedding or upholstery.   Dental health: Discussed importance of regular tooth brushing, flossing, and dental visits.    NEXT PREVENTATIVE PHYSICAL DUE IN 1 YEAR. No follow-ups on file.              [1]  Allergies Allergen Reactions    Wound Dressing Adhesive Rash   Penicillins Rash   Neosporin [Neomycin-Polymyxin-Gramicidin] Rash  [2]  Social History Tobacco Use  Smoking Status Never  Smokeless Tobacco Never   "

## 2024-06-30 ENCOUNTER — Ambulatory Visit: Payer: Self-pay | Admitting: Family Medicine

## 2024-06-30 LAB — TSH: TSH: 0.991 u[IU]/mL (ref 0.450–4.500)

## 2024-06-30 LAB — CBC WITH DIFFERENTIAL/PLATELET
Basophils Absolute: 0.1 10*3/uL (ref 0.0–0.2)
Basos: 1 %
EOS (ABSOLUTE): 0.2 10*3/uL (ref 0.0–0.4)
Eos: 3 %
Hematocrit: 43.1 % (ref 34.0–46.6)
Hemoglobin: 13.8 g/dL (ref 11.1–15.9)
Immature Grans (Abs): 0 10*3/uL (ref 0.0–0.1)
Immature Granulocytes: 0 %
Lymphocytes Absolute: 0.8 10*3/uL (ref 0.7–3.1)
Lymphs: 13 %
MCH: 29.9 pg (ref 26.6–33.0)
MCHC: 32 g/dL (ref 31.5–35.7)
MCV: 93 fL (ref 79–97)
Monocytes Absolute: 0.4 10*3/uL (ref 0.1–0.9)
Monocytes: 7 %
Neutrophils Absolute: 4.8 10*3/uL (ref 1.4–7.0)
Neutrophils: 76 %
Platelets: 307 10*3/uL (ref 150–450)
RBC: 4.62 x10E6/uL (ref 3.77–5.28)
RDW: 13.1 % (ref 11.7–15.4)
WBC: 6.3 10*3/uL (ref 3.4–10.8)

## 2024-06-30 LAB — COMPREHENSIVE METABOLIC PANEL WITH GFR
ALT: 16 [IU]/L (ref 0–32)
AST: 19 [IU]/L (ref 0–40)
Albumin: 4.1 g/dL (ref 3.9–4.9)
Alkaline Phosphatase: 110 [IU]/L (ref 49–135)
BUN/Creatinine Ratio: 26 (ref 12–28)
BUN: 18 mg/dL (ref 8–27)
Bilirubin Total: 0.3 mg/dL (ref 0.0–1.2)
CO2: 23 mmol/L (ref 20–29)
Calcium: 9.5 mg/dL (ref 8.7–10.3)
Chloride: 104 mmol/L (ref 96–106)
Creatinine, Ser: 0.7 mg/dL (ref 0.57–1.00)
Globulin, Total: 2 g/dL (ref 1.5–4.5)
Glucose: 86 mg/dL (ref 70–99)
Potassium: 4.7 mmol/L (ref 3.5–5.2)
Sodium: 140 mmol/L (ref 134–144)
Total Protein: 6.1 g/dL (ref 6.0–8.5)
eGFR: 95 mL/min/{1.73_m2}

## 2024-06-30 LAB — VITAMIN B12: Vitamin B-12: 684 pg/mL (ref 232–1245)

## 2024-06-30 LAB — LIPID PANEL W/O CHOL/HDL RATIO
Cholesterol, Total: 180 mg/dL (ref 100–199)
HDL: 70 mg/dL
LDL Chol Calc (NIH): 89 mg/dL (ref 0–99)
Triglycerides: 118 mg/dL (ref 0–149)
VLDL Cholesterol Cal: 21 mg/dL (ref 5–40)

## 2024-08-03 ENCOUNTER — Ambulatory Visit

## 2024-09-28 ENCOUNTER — Ambulatory Visit: Admitting: Family Medicine
